# Patient Record
Sex: Female | Born: 1950 | Race: Black or African American | Hispanic: No | Marital: Married | Smoking: Never smoker
Health system: Southern US, Community
[De-identification: ages and names within clinical notes are randomized; demographics above are authoritative.]

## PROBLEM LIST (undated history)

## (undated) DIAGNOSIS — M1990 Unspecified osteoarthritis, unspecified site: Secondary | ICD-10-CM

## (undated) DIAGNOSIS — M719 Bursopathy, unspecified: Secondary | ICD-10-CM

## (undated) DIAGNOSIS — R112 Nausea with vomiting, unspecified: Secondary | ICD-10-CM

## (undated) DIAGNOSIS — I10 Essential (primary) hypertension: Secondary | ICD-10-CM

## (undated) DIAGNOSIS — D649 Anemia, unspecified: Secondary | ICD-10-CM

## (undated) DIAGNOSIS — K5792 Diverticulitis of intestine, part unspecified, without perforation or abscess without bleeding: Secondary | ICD-10-CM

## (undated) DIAGNOSIS — Z9889 Other specified postprocedural states: Secondary | ICD-10-CM

## (undated) DIAGNOSIS — E785 Hyperlipidemia, unspecified: Secondary | ICD-10-CM

## (undated) DIAGNOSIS — D509 Iron deficiency anemia, unspecified: Secondary | ICD-10-CM

## (undated) DIAGNOSIS — E669 Obesity, unspecified: Secondary | ICD-10-CM

## (undated) HISTORY — PX: CARPAL TUNNEL RELEASE: SHX101

## (undated) HISTORY — DX: Diverticulitis of intestine, part unspecified, without perforation or abscess without bleeding: K57.92

## (undated) HISTORY — DX: Hyperlipidemia, unspecified: E78.5

## (undated) HISTORY — PX: OTHER SURGICAL HISTORY: SHX169

## (undated) HISTORY — DX: Essential (primary) hypertension: I10

## (undated) HISTORY — PX: TONSILLECTOMY: SUR1361

---

## 1997-04-01 HISTORY — PX: ESOPHAGOGASTRODUODENOSCOPY: SHX1529

## 2000-12-15 ENCOUNTER — Other Ambulatory Visit: Admission: RE | Admit: 2000-12-15 | Discharge: 2000-12-15 | Payer: Self-pay | Admitting: Obstetrics and Gynecology

## 2001-02-14 ENCOUNTER — Encounter: Payer: Self-pay | Admitting: Obstetrics and Gynecology

## 2001-02-14 ENCOUNTER — Ambulatory Visit (HOSPITAL_COMMUNITY): Admission: RE | Admit: 2001-02-14 | Discharge: 2001-02-14 | Payer: Self-pay | Admitting: Obstetrics and Gynecology

## 2007-05-24 ENCOUNTER — Emergency Department: Payer: Self-pay | Admitting: Emergency Medicine

## 2007-05-24 ENCOUNTER — Other Ambulatory Visit: Payer: Self-pay

## 2007-06-20 ENCOUNTER — Ambulatory Visit: Payer: Self-pay | Admitting: Family Medicine

## 2008-03-14 ENCOUNTER — Ambulatory Visit: Payer: Self-pay | Admitting: Gastroenterology

## 2008-03-14 HISTORY — PX: COLONOSCOPY: SHX174

## 2009-01-19 ENCOUNTER — Emergency Department: Payer: Self-pay | Admitting: Emergency Medicine

## 2009-11-14 ENCOUNTER — Inpatient Hospital Stay: Payer: Self-pay | Admitting: Internal Medicine

## 2009-12-14 ENCOUNTER — Ambulatory Visit: Payer: Self-pay | Admitting: Family Medicine

## 2016-01-13 ENCOUNTER — Ambulatory Visit: Payer: Self-pay | Admitting: Nurse Practitioner

## 2016-01-20 ENCOUNTER — Ambulatory Visit: Payer: Self-pay | Admitting: Nurse Practitioner

## 2016-01-20 ENCOUNTER — Ambulatory Visit: Payer: Self-pay | Admitting: Family Medicine

## 2016-01-29 ENCOUNTER — Ambulatory Visit (INDEPENDENT_AMBULATORY_CARE_PROVIDER_SITE_OTHER)
Admission: RE | Admit: 2016-01-29 | Discharge: 2016-01-29 | Disposition: A | Discharge status: Home / Self Care | Payer: TRICARE For Life (TFL) | Source: Ambulatory Visit | Attending: Family Medicine | Admitting: Family Medicine

## 2016-01-29 ENCOUNTER — Ambulatory Visit (INDEPENDENT_AMBULATORY_CARE_PROVIDER_SITE_OTHER): Payer: TRICARE For Life (TFL) | Admitting: Family Medicine

## 2016-01-29 ENCOUNTER — Encounter: Payer: Self-pay | Admitting: Family Medicine

## 2016-01-29 ENCOUNTER — Encounter (INDEPENDENT_AMBULATORY_CARE_PROVIDER_SITE_OTHER): Payer: Self-pay

## 2016-01-29 VITALS — BP 130/90 | HR 93 | Temp 97.9°F | Ht 62.5 in | Wt 212.6 lb

## 2016-01-29 DIAGNOSIS — I10 Essential (primary) hypertension: Secondary | ICD-10-CM

## 2016-01-29 DIAGNOSIS — M5441 Lumbago with sciatica, right side: Secondary | ICD-10-CM

## 2016-01-29 DIAGNOSIS — E785 Hyperlipidemia, unspecified: Secondary | ICD-10-CM | POA: Diagnosis not present

## 2016-01-29 DIAGNOSIS — M25551 Pain in right hip: Secondary | ICD-10-CM

## 2016-01-29 LAB — COMPREHENSIVE METABOLIC PANEL
ALT: 14 U/L (ref 0–35)
AST: 16 U/L (ref 0–37)
Albumin: 4.4 g/dL (ref 3.5–5.2)
Alkaline Phosphatase: 78 U/L (ref 39–117)
BUN: 13 mg/dL (ref 6–23)
CO2: 27 mEq/L (ref 19–32)
Calcium: 10 mg/dL (ref 8.4–10.5)
Chloride: 107 mEq/L (ref 96–112)
Creatinine, Ser: 0.71 mg/dL (ref 0.40–1.20)
GFR: 106.88 mL/min (ref >60.00)
Glucose, Bld: 83 mg/dL (ref 70–99)
Potassium: 4.5 mEq/L (ref 3.5–5.1)
Sodium: 140 mEq/L (ref 135–145)
Total Bilirubin: 0.5 mg/dL (ref 0.2–1.2)
Total Protein: 8 g/dL (ref 6.0–8.3)

## 2016-01-29 LAB — HEMOGLOBIN A1C: Hgb A1c MFr Bld: 6.2 % (ref 4.6–6.5)

## 2016-01-29 NOTE — Progress Notes (Signed)
Pre visit review using our clinic review tool, if applicable. No additional management support is needed unless otherwise documented below in the visit note. 

## 2016-01-29 NOTE — Patient Instructions (Addendum)
Nice to meet you. We're going to check some lab work and then start you on a blood pressure medicine. You should continue to work on diet and exercise to help with her cholesterol. We will obtain an x-ray of your back and right hip to evaluate for causes of her discomfort. You can use Tylenol 1000 mg every 8 hours as needed for pain. If you develop numbness, weakness, loss of bowel or bladder function, numbness between her legs, fevers, or any new or changing symptoms please seek medical attention.

## 2016-01-31 ENCOUNTER — Encounter: Payer: Self-pay | Admitting: Family Medicine

## 2016-01-31 DIAGNOSIS — I10 Essential (primary) hypertension: Secondary | ICD-10-CM | POA: Insufficient documentation

## 2016-01-31 DIAGNOSIS — E785 Hyperlipidemia, unspecified: Secondary | ICD-10-CM | POA: Insufficient documentation

## 2016-01-31 DIAGNOSIS — M549 Dorsalgia, unspecified: Secondary | ICD-10-CM | POA: Insufficient documentation

## 2016-01-31 MED ORDER — AMLODIPINE BESYLATE 5 MG PO TABS: 5.0000 mg | ORAL_TABLET | Freq: Every day | ORAL | Status: DC

## 2016-01-31 NOTE — Progress Notes (Signed)
Patient ID: Mary Wolfe, female   DOB: 1951-01-07, 65 y.o.   MRN: [id number]  Tommi Rumps, MD Phone: [phone]  Mary Wolfe is a 65 y.o. female who presents today for new patient visit.  HYPERTENSION Disease Monitoring: Blood pressure range-not checking consistently, does report it has been high in the past and that today is actually low for her. Was 158/93 at recent health screening. Chest pain, palpitations- no      Dyspnea- no Medications: Compliance- not currently taking any medications, states she is managing it with natural supplements of which she does not remember the name Lightheadedness,Syncope- no   Edema- no  HYPERLIPIDEMIA Disease Monitoring: See symptoms for Hypertension Medications: Compliance- not currently on any medication.  Back pain and right hip pain: Patient notes intermittently for a number of months she has had right low back pain and right posterior and anterior hip pain. Does note some radiation of the discomfort down her right leg to above her knee posteriorly. Sometimes it is sharp and sometimes it is sore. Has had one single occasion of left lateral thigh numbness lasting a few minutes that resolved with change in position. No other numbness. No weakness. No loss of bowel or bladder function. No saddle anesthesia. No fevers. No history of cancer. She has been taking Aleve, Advil, turmeric, and CoQ10 for this with some benefit.    Active Ambulatory Problems    Diagnosis Date Noted  . Essential hypertension 01/31/2016  . Hyperlipidemia 01/31/2016  . Back pain 01/31/2016   Resolved Ambulatory Problems    Diagnosis Date Noted  . No Resolved Ambulatory Problems   Past Medical History  Diagnosis Date  . Diverticulitis   . Hypertension     Family History  Problem Relation Age of Onset  . Heart disease    . Arthritis      Social History   Social History  . Marital Status: Married    Spouse Name: N/A  . Number of Children: N/A  . Years of  Education: N/A   Occupational History  . Not on file.   Social History Main Topics  . Smoking status: Never Smoker   . Smokeless tobacco: Not on file  . Alcohol Use: No  . Drug Use: No  . Sexual Activity: Not on file   Other Topics Concern  . Not on file   Social History Narrative    ROS  General:  Negative for nexplained weight loss, fever Skin: Negative for new or changing mole, sore that won't heal HEENT: Negative for trouble hearing, trouble seeing, ringing in ears, mouth sores, hoarseness, change in voice, dysphagia. CV:  Negative for chest pain, dyspnea, edema, palpitations Resp: Negative for cough, dyspnea, hemoptysis GI: Negative for nausea, vomiting, diarrhea, constipation, abdominal pain, melena, hematochezia. GU: Negative for dysuria, incontinence, urinary hesitance, hematuria, vaginal or penile discharge, polyuria, sexual difficulty, lumps in testicle or breasts MSK: Positive for muscle cramps or aches, joint pain or swelling Neuro: Negative for headaches, weakness, numbness, dizziness, passing out/fainting Psych: Negative for depression, anxiety, memory problems  Objective  Physical Exam Filed Vitals:   01/29/16 0957  BP: 130/90  Pulse: 93  Temp: 97.9 F (36.6 C)    BP Readings from Last 3 Encounters:  01/29/16 130/90   Wt Readings from Last 3 Encounters:  01/29/16 212 lb 9.6 oz (96.435 kg)    Physical Exam  Constitutional: She is well-developed, well-nourished, and in no distress.  HENT:  Head: Normocephalic and atraumatic.  Right Ear: External  ear normal.  Left Ear: External ear normal.  Mouth/Throat: Oropharynx is clear and moist. No oropharyngeal exudate.  Eyes: Conjunctivae are normal. Pupils are equal, round, and reactive to light.  Neck: Neck supple.  Cardiovascular: Normal rate, regular rhythm and normal heart sounds.   Pulmonary/Chest: Effort normal and breath sounds normal.  Abdominal: Soft. Bowel sounds are normal. She exhibits no  distension. There is no tenderness.  Musculoskeletal: She exhibits no edema.  No midline spine tenderness no midline spine step-off, mild right low back muscular tenderness with no overlying skin changes, no other muscular back tenderness, no buttocks tenderness on the right, discomfort on internal rotation of right hip with mild decreased range of motion, no discomfort on external range of motion, no discomfort on left hip internal or external range of motion, negative sitting straight leg raise  Lymphadenopathy:    She has no cervical adenopathy.  Neurological: She is alert.  CN 2-12 intact, 5/5 strength in bilateral biceps, triceps, grip, quads, hamstrings, plantar and dorsiflexion, sensation to light touch intact in bilateral UE and LE, normal gait, 2+ patellar reflexes  Skin: Skin is warm and dry. She is not diaphoretic.  Psychiatric: Mood and affect normal.     Assessment/Plan:   Essential hypertension Slightly above goal diastolically on recheck today. I'll screening form reveals elevated blood pressure. CMP obtained revealing normal kidney function. We will start her on a low-dose of amlodipine. Discussed monitoring her blood pressure at a pharmacy since she has no cough at home.  Hyperlipidemia Total cholesterol 229 and LDL 148 on recent health screening. ASCVD risk score 7.6%. Initially calculated to be lower than this while patient was in the office. I discussed starting a statin though patient wanted to try diet and exercise first. Given this recalculated score we will contact the patient to discuss cholesterol medication further.  Back pain Patient with right low back pain and right hip pain. Neurologically intact. Had a single episode of numbness in the left lateral thigh that resolved with change of position and suspect this was positional. No red flags. Suspect possible muscular strain and low back versus possible osteoarthritis of the right hip given physical exam. Given single  episode of numbness will obtain an x-ray of her low back. Given her hip pain we will obtain x-ray of her hip. She can use Tylenol 1000 mg every 8 hours as needed for pain. Depending on x-ray results we will determine the next step in her management. Given return precautions.    Orders Placed This Encounter  Procedures  . DG HIP UNILAT WITH PELVIS 2-3 VIEWS RIGHT    Standing Status: Future     Number of Occurrences: 1     Standing Expiration Date: 03/30/2017    Order Specific Question:  Reason for Exam (SYMPTOM  OR DIAGNOSIS REQUIRED)    Answer:  right hip pain, discomfort on internal rotation    Order Specific Question:  Preferred imaging location?    Answer:  [facility]    Standing Status: Future     Number of Occurrences: 1     Standing Expiration Date: 03/30/2017    Order Specific Question:  Reason for Exam (SYMPTOM  OR DIAGNOSIS REQUIRED)    Answer:  low back pain with sciatica on right side, single episode of numbness    Order Specific Question:  Preferred imaging location?    Answer:  [place] ([facility])  . HgB A1c  Meds ordered this encounter  Medications  . amLODipine (NORVASC) 5 MG tablet    Sig: Take 1 tablet (5 mg total) by mouth daily.    Dispense:  90 tablet    Refill:  [place], MD [place]

## 2016-01-31 NOTE — Assessment & Plan Note (Addendum)
Patient with right low back pain and right hip pain. Neurologically intact. Had a single episode of numbness in the left lateral thigh that resolved with change of position and suspect this was positional. No red flags. Suspect possible muscular strain and low back versus possible osteoarthritis of the right hip given physical exam. Given single episode of numbness will obtain an x-ray of her low back. Given her hip pain we will obtain x-ray of her hip. She can use Tylenol 1000 mg every 8 hours as needed for pain. Depending on x-ray results we will determine the next step in her management. Given return precautions.

## 2016-01-31 NOTE — Assessment & Plan Note (Signed)
Total cholesterol 229 and LDL 148 on recent health screening. ASCVD risk score 7.6%. Initially calculated to be lower than this while patient was in the office. I discussed starting a statin though patient wanted to try diet and exercise first. Given this recalculated score we will contact the patient to discuss cholesterol medication further.

## 2016-01-31 NOTE — Assessment & Plan Note (Signed)
Slightly above goal diastolically on recheck today. I'll screening form reveals elevated blood pressure. CMP obtained revealing normal kidney function. We will start her on a low-dose of amlodipine. Discussed monitoring her blood pressure at a pharmacy since she has no cough at home.

## 2016-02-01 ENCOUNTER — Encounter: Payer: Self-pay | Admitting: Family Medicine

## 2016-02-02 ENCOUNTER — Telehealth: Payer: Self-pay | Admitting: Family Medicine

## 2016-02-02 DIAGNOSIS — M25551 Pain in right hip: Secondary | ICD-10-CM

## 2016-02-02 NOTE — Telephone Encounter (Signed)
Called patient and advised of lab results and x-ray results. Patient would prefer physical therapy rather than going to orthopedic surgery for her hip. This referral to physical therapy has been placed. Discussed the need for diet and exercise. Advised that on recalculating her ASCVD risk score she actually met criteria for statin benefit though she would like to work on diet and exercise prior to considering statin therapy.

## 2016-03-11 ENCOUNTER — Ambulatory Visit: Payer: TRICARE For Life (TFL) | Admitting: Family Medicine

## 2016-04-01 ENCOUNTER — Ambulatory Visit (INDEPENDENT_AMBULATORY_CARE_PROVIDER_SITE_OTHER): Payer: TRICARE For Life (TFL) | Admitting: Family Medicine

## 2016-04-01 ENCOUNTER — Encounter: Payer: Self-pay | Admitting: Family Medicine

## 2016-04-01 DIAGNOSIS — I10 Essential (primary) hypertension: Secondary | ICD-10-CM

## 2016-04-01 DIAGNOSIS — E669 Obesity, unspecified: Secondary | ICD-10-CM | POA: Diagnosis not present

## 2016-04-01 DIAGNOSIS — M1611 Unilateral primary osteoarthritis, right hip: Secondary | ICD-10-CM | POA: Insufficient documentation

## 2016-04-01 NOTE — Patient Instructions (Signed)
Nice to see you. Congratulations on her blood pressure and weight loss. Please continue your amlodipine and diet and exercise. Please continue physical therapy for your hip. You can try ibuprofen 600 mg by mouth every 8 hours as needed for discomfort or you can try Aleve 220 mg by mouth every 12 hours as needed for discomfort. Please periodically monitor your blood pressure and if consistently greater than 140/90 please let us know.

## 2016-04-01 NOTE — Progress Notes (Signed)
  Marikay Alar, MD Phone: [phone]  Mary Wolfe is a 65 y.o. female who presents today for follow-up.  Right hip pain: Patient notes this is 20-30% better since starting on PT. She's doing Tylenol as needed for discomfort. She has more movement with this. Mild soreness with this. Is improving. Tylenol has been somewhat beneficial.  Obesity: Patient has been working on diet and exercise. She is down 7 pounds. Has decreased fried food intake. Watching what she eats more. Physical therapy for exercise.  HYPERTENSION  Disease Monitoring  Home BP Monitoring not checking Chest pain- no    Dyspnea- no Medications  Compliance-  taking amlodipine.  Edema- no  PMH: nonsmoker.   ROS see history of present illness  Objective  Physical Exam Vitals:   04/01/16 0929  BP: 126/84  Pulse: 96  Temp: 98.5 F (36.9 C)    BP Readings from Last 3 Encounters:  04/01/16 126/84  01/29/16 130/90   Wt Readings from Last 3 Encounters:  04/01/16 205 lb 6.4 oz (93.2 kg)  01/29/16 212 lb 9.6 oz (96.4 kg)    Physical Exam  Constitutional: No distress.  HENT:  Head: Normocephalic and atraumatic.  Cardiovascular: Normal rate, regular rhythm and normal heart sounds.   Pulmonary/Chest: Effort normal and breath sounds normal.  Musculoskeletal:  No midline spine tenderness, no midline spine step-off, no muscular back tenderness, mild right lateral hip soreness on palpation, decreased internal and sternal range of motion in the right hip though no discomfort today, left hip with good internal or external range of motion with no discomfort  Neurological: She is alert. Gait normal.  Skin: She is not diaphoretic.     Assessment/Plan: Please see individual problem list.  Osteoarthritis of right hip Patient with severe degenerative changes on prior x-ray of right hip. She has responded quite well with physical therapy. Discussed trialing ibuprofen or naproxen as outlined in the AVS given that her  blood pressures under better control now. Advised not to take both of them. She will continue physical therapy. She'll continue to monitor.  Essential hypertension At goal today. Continue amlodipine.  Obesity Congratulated patient on loss of 7 pounds. Encouraged continued diet changes. Continue exercise. Continue to monitor.   No orders of the defined types were placed in this encounter.   No orders of the defined types were placed in this encounter.   # Healthcare maintenance: Patient will return at her convenience in the next 1-2 months for physical exam to take care of her health maintenance issues.   Marikay Alar, MD [facility]

## 2016-04-01 NOTE — Assessment & Plan Note (Signed)
Congratulated patient on loss of 7 pounds. Encouraged continued diet changes. Continue exercise. Continue to monitor.

## 2016-04-01 NOTE — Assessment & Plan Note (Signed)
At goal today. Continue amlodipine. 

## 2016-04-01 NOTE — Progress Notes (Signed)
Pre visit review using our clinic review tool, if applicable. No additional management support is needed unless otherwise documented below in the visit note. 

## 2016-04-01 NOTE — Assessment & Plan Note (Addendum)
Patient with severe degenerative changes on prior x-ray of right hip. She has responded quite well with physical therapy. Discussed trialing ibuprofen or naproxen as outlined in the AVS given that her blood pressures under better control now. Advised not to take both of them. She will continue physical therapy. She'll continue to monitor.

## 2016-05-25 ENCOUNTER — Encounter: Payer: TRICARE For Life (TFL) | Admitting: Family Medicine

## 2016-06-17 ENCOUNTER — Encounter: Payer: TRICARE For Life (TFL) | Admitting: Family Medicine

## 2016-06-24 ENCOUNTER — Ambulatory Visit (INDEPENDENT_AMBULATORY_CARE_PROVIDER_SITE_OTHER): Payer: TRICARE For Life (TFL) | Admitting: Family Medicine

## 2016-06-24 ENCOUNTER — Encounter: Payer: Self-pay | Admitting: Family Medicine

## 2016-06-24 VITALS — BP 138/82 | HR 74 | Temp 98.0°F | Ht 61.75 in | Wt 204.1 lb

## 2016-06-24 DIAGNOSIS — Z0000 Encounter for general adult medical examination without abnormal findings: Secondary | ICD-10-CM | POA: Diagnosis not present

## 2016-06-24 DIAGNOSIS — Z23 Encounter for immunization: Secondary | ICD-10-CM | POA: Diagnosis not present

## 2016-06-24 DIAGNOSIS — Z1329 Encounter for screening for other suspected endocrine disorder: Secondary | ICD-10-CM

## 2016-06-24 DIAGNOSIS — Z13220 Encounter for screening for lipoid disorders: Secondary | ICD-10-CM

## 2016-06-24 DIAGNOSIS — Z124 Encounter for screening for malignant neoplasm of cervix: Secondary | ICD-10-CM | POA: Diagnosis not present

## 2016-06-24 DIAGNOSIS — Z1231 Encounter for screening mammogram for malignant neoplasm of breast: Secondary | ICD-10-CM

## 2016-06-24 DIAGNOSIS — Z130 Encounter for screening for diseases of the blood and blood-forming organs and certain disorders involving the immune mechanism: Secondary | ICD-10-CM | POA: Diagnosis not present

## 2016-06-24 DIAGNOSIS — Z1211 Encounter for screening for malignant neoplasm of colon: Secondary | ICD-10-CM

## 2016-06-24 DIAGNOSIS — Z114 Encounter for screening for human immunodeficiency virus [HIV]: Secondary | ICD-10-CM

## 2016-06-24 DIAGNOSIS — Z1159 Encounter for screening for other viral diseases: Secondary | ICD-10-CM

## 2016-06-24 DIAGNOSIS — Z1239 Encounter for other screening for malignant neoplasm of breast: Secondary | ICD-10-CM

## 2016-06-24 LAB — TSH: TSH: 1.45 mIU/L

## 2016-06-24 LAB — CBC
HEMATOCRIT: 33.2 % — AB (ref 35.0–45.0)
HEMOGLOBIN: 11 g/dL — AB (ref 11.7–15.5)
MCH: 28 pg (ref 27.0–33.0)
MCHC: 33.1 g/dL (ref 32.0–36.0)
MCV: 84.5 fL (ref 80.0–100.0)
MPV: 10.5 fL (ref 7.5–12.5)
Platelets: 297 10*3/uL (ref 140–400)
RBC: 3.93 MIL/uL (ref 3.80–5.10)
RDW: 15.4 % — AB (ref 11.0–15.0)
WBC: 9.7 10*3/uL (ref 3.8–10.8)

## 2016-06-24 LAB — COMPREHENSIVE METABOLIC PANEL
ALBUMIN: 4.4 g/dL (ref 3.6–5.1)
ALT: 11 U/L (ref 6–29)
AST: 16 U/L (ref 10–35)
Alkaline Phosphatase: 74 U/L (ref 33–130)
BILIRUBIN TOTAL: 0.6 mg/dL (ref 0.2–1.2)
BUN: 15 mg/dL (ref 7–25)
CALCIUM: 9.8 mg/dL (ref 8.6–10.4)
CHLORIDE: 104 mmol/L (ref 98–110)
CO2: 26 mmol/L (ref 20–31)
Creat: 0.89 mg/dL (ref 0.50–0.99)
Glucose, Bld: 79 mg/dL (ref 65–99)
Potassium: 4.4 mmol/L (ref 3.5–5.3)
Sodium: 141 mmol/L (ref 135–146)
TOTAL PROTEIN: 7.8 g/dL (ref 6.1–8.1)

## 2016-06-24 LAB — LIPID PANEL
CHOL/HDL RATIO: 4.5 ratio (ref <=5.0)
CHOLESTEROL: 239 mg/dL — AB (ref 125–200)
HDL: 53 mg/dL (ref >=46)
LDL Cholesterol: 166 mg/dL — ABNORMAL HIGH (ref <130)
TRIGLYCERIDES: 102 mg/dL (ref <150)
VLDL: 20 mg/dL (ref <30)

## 2016-06-24 NOTE — Assessment & Plan Note (Addendum)
Overall patient is doing well. Advised on diet and exercise. Breast exam completed. Mammogram will be ordered. Referral to GI for colonoscopy. Referral to gynecology to complete pelvic exam and Pap smear. Labs as outlined below. Tetanus vaccination and Zostavax brought up-to-date.

## 2016-06-24 NOTE — Patient Instructions (Signed)
Nice to see you. We'll get you set up for a mammogram. We'll get you set up for a colonoscopy. We'll get you to see gynecology for Pap smear. We'll obtain some lab work and call you with the results.

## 2016-06-24 NOTE — Progress Notes (Signed)
Tommi Rumps, MD Phone: [phone]  Mary Wolfe is a 65 y.o. female who presents today for physical exam.  Notes she tries to stay away from sugar. Tries to eat a healthy diet. Avoids soda and sweet tea. Has not been exercising much recently. Though is going to get back to the gym next week. Has been doing exercises through physical therapy. Needs a mammogram, colonoscopy, and Pap smear. No prior HIV or hepatitis C testing. No tobacco use, alcohol use, or illicit drug use. Denies flu shot. Has not had a shingles vaccine. No prior tetanus vaccination. Sees an ophthalmologist and dentist.  Active Ambulatory Problems    Diagnosis Date Noted  . Essential hypertension 01/31/2016  . Hyperlipidemia 01/31/2016  . Back pain 01/31/2016  . Osteoarthritis of right hip 04/01/2016  . Obesity 04/01/2016  . Routine general medical examination at a health care facility 06/24/2016   Resolved Ambulatory Problems    Diagnosis Date Noted  . No Resolved Ambulatory Problems   Past Medical History:  Diagnosis Date  . Diverticulitis   . Hyperlipidemia   . Hypertension     Family History  Problem Relation Age of Onset  . Heart disease    . Arthritis      Social History   Social History  . Marital status: Married    Spouse name: N/A  . Number of children: N/A  . Years of education: N/A   Occupational History  . Not on file.   Social History Main Topics  . Smoking status: Never Smoker  . Smokeless tobacco: Not on file  . Alcohol use No  . Drug use: No  . Sexual activity: Not on file   Other Topics Concern  . Not on file   Social History Narrative  . No narrative on file    ROS  General:  Negative for nexplained weight loss, fever Skin: Negative for new or changing mole, sore that won't heal HEENT: Negative for trouble hearing, trouble seeing, ringing in ears, mouth sores, hoarseness, change in voice, dysphagia. CV:  Negative for chest pain, dyspnea, edema,  palpitations Resp: Negative for cough, dyspnea, hemoptysis GI: Negative for nausea, vomiting, diarrhea, constipation, abdominal pain, melena, hematochezia. GU: Negative for dysuria, incontinence, urinary hesitance, hematuria, vaginal or penile discharge, polyuria, sexual difficulty, lumps in testicle or breasts MSK: Negative for muscle cramps or aches, Positive for chronic joint pain  Neuro: Negative for headaches, weakness, numbness, dizziness, passing out/fainting Psych: Negative for depression, anxiety, memory problems  Objective  Physical Exam Vitals:   06/24/16 1532  BP: 138/82  Pulse: 74  Temp: 98 F (36.7 C)    BP Readings from Last 3 Encounters:  06/24/16 138/82  04/01/16 126/84  01/29/16 130/90   Wt Readings from Last 3 Encounters:  06/24/16 204 lb 2 oz (92.6 kg)  04/01/16 205 lb 6.4 oz (93.2 kg)  01/29/16 212 lb 9.6 oz (96.4 kg)    Physical Exam  Constitutional: No distress.  HENT:  Head: Normocephalic and atraumatic.  Mouth/Throat: Oropharynx is clear and moist. No oropharyngeal exudate.  Eyes: Conjunctivae are normal. Pupils are equal, round, and reactive to light.  Cardiovascular: Normal rate, regular rhythm and normal heart sounds.   Pulmonary/Chest: Effort normal and breath sounds normal.  Bilateral breasts no masses or skin changes, no nipple inversion, no axillary masses palpated  Abdominal: Soft. Bowel sounds are normal. She exhibits no distension. There is no tenderness. There is no rebound and no guarding.  Genitourinary:  Genitourinary Comments: Attempted speculum  exam though unable to get the cervix into view given body habitus and cervical positioning, normal vagina  Musculoskeletal: She exhibits no edema.  Neurological: She is alert. Gait normal.  Skin: Skin is warm and dry. She is not diaphoretic.  Psychiatric: Mood and affect normal.     Assessment/Plan:   Routine general medical examination at a health care facility Overall patient is  doing well. Advised on diet and exercise. Breast exam completed. Mammogram will be ordered. Referral to GI for colonoscopy. Referral to gynecology to complete pelvic exam and Pap smear. Labs as outlined below. Tetanus vaccination and Zostavax brought up-to-date.   Orders Placed This Encounter  Procedures  . MM SCREENING BREAST TOMO BILATERAL    Standing Status:   Future    Standing Expiration Date:   08/24/2017    Order Specific Question:   Reason for Exam (SYMPTOM  OR DIAGNOSIS REQUIRED)    Answer:   breast cancer screening    Order Specific Question:   Preferred imaging location?    Answer:   [facility]  . Tdap vaccine greater than or equal to 7yo IM  . Varicella-zoster vaccine subcutaneous  . Lipid Profile  . CBC  . TSH  . Comp Met ([facility])  . Hepatitis C Antibody  . HIV antibody (with reflex)  . Ambulatory referral to Gynecology    Referral Priority:   Routine    Referral Type:   Consultation    Referral Reason:   Specialty Services Required    Requested Specialty:   Gynecology    Number of Visits Requested:   1  . Ambulatory referral to Gastroenterology    Referral Priority:   Routine    Referral Type:   Consultation    Referral Reason:   Specialty Services Required    Number of Visits Requested:   1    Meds ordered this encounter  Medications  . naproxen sodium (ANAPROX) 220 MG tablet    Sig: Take 220 mg by mouth 2 (two) times daily with a meal.     Tommi Rumps, MD [place]

## 2016-06-25 LAB — HEPATITIS C ANTIBODY: HCV AB: NEGATIVE

## 2016-06-25 LAB — HIV ANTIBODY (ROUTINE TESTING W REFLEX): HIV: NONREACTIVE

## 2016-06-29 ENCOUNTER — Encounter: Payer: Self-pay | Admitting: Family Medicine

## 2016-06-30 ENCOUNTER — Telehealth: Payer: Self-pay | Admitting: Family Medicine

## 2016-06-30 NOTE — Telephone Encounter (Signed)
Noted. Agree. CMA spoke with patient and patient stated there was swelling and itching just at the injection site. No redness, pain, or warmth.

## 2016-06-30 NOTE — Telephone Encounter (Signed)
Pt called and left a voicemail stating where she had her Tdap done it is swollen and itching, would like to know if there is anything that she can do? Please advise, thank you!  Call pt @ [phone]

## 2016-06-30 NOTE — Telephone Encounter (Signed)
After talking to patient and Dr. Birdie SonsSonnenberg suggested to put ice on injection site. If start having redness, pain, or heat she will need to be seen. Patient verbalized understanding.

## 2016-08-03 ENCOUNTER — Ambulatory Visit
Admission: RE | Admit: 2016-08-03 | Discharge: 2016-08-03 | Disposition: A | Discharge status: Home / Self Care | Source: Ambulatory Visit | Attending: Family Medicine | Admitting: Family Medicine

## 2016-08-03 DIAGNOSIS — Z1239 Encounter for other screening for malignant neoplasm of breast: Secondary | ICD-10-CM

## 2016-08-03 DIAGNOSIS — Z1231 Encounter for screening mammogram for malignant neoplasm of breast: Secondary | ICD-10-CM | POA: Insufficient documentation

## 2016-08-24 ENCOUNTER — Encounter: Payer: Self-pay | Admitting: Obstetrics and Gynecology

## 2016-08-25 ENCOUNTER — Encounter: Payer: Self-pay | Admitting: Obstetrics and Gynecology

## 2016-09-28 ENCOUNTER — Encounter: Payer: Self-pay | Admitting: Obstetrics and Gynecology

## 2016-10-03 ENCOUNTER — Encounter: Payer: Self-pay | Admitting: Obstetrics and Gynecology

## 2016-10-03 ENCOUNTER — Ambulatory Visit (INDEPENDENT_AMBULATORY_CARE_PROVIDER_SITE_OTHER): Payer: TRICARE For Life (TFL) | Admitting: Obstetrics and Gynecology

## 2016-10-03 VITALS — BP 138/86 | HR 92 | Ht 61.75 in | Wt 221.1 lb

## 2016-10-03 DIAGNOSIS — Z01419 Encounter for gynecological examination (general) (routine) without abnormal findings: Secondary | ICD-10-CM

## 2016-10-03 NOTE — Progress Notes (Signed)
HPI:      [name] is a 66 y.o. [phone] who LMP was No LMP recorded. Patient is postmenopausal..  Subjective: She presents today referred by Dr. Birdie SonsSonnenberg for a pelvic examination and Pap smear. He does her general medical care. She has no specific gynecologic complaints today.     Hx: The following portions of the patient's history were reviewed and updated as appropriate:           She  has a past medical history of Diverticulitis; Hyperlipidemia; and Hypertension. She  does not have any pertinent problems on file. She  has a past surgical history that includes Tonsillectomy and Cesarean section.        ROS: Constitutional: Denied constitutional symptoms, night sweats, recent illness, fatigue, fever, insomnia and weight loss.  Eyes: Denied eye symptoms, eye pain, photophobia, vision change and visual disturbance.  Ears/Nose/Throat/Neck: Denied ear, nose, throat or neck symptoms, hearing loss, nasal discharge, sinus congestion and sore throat.  Cardiovascular: Denied cardiovascular symptoms, arrhythmia, chest pain/pressure, edema, exercise intolerance, orthopnea and palpitations.  Respiratory: Denied pulmonary symptoms, asthma, pleuritic pain, productive sputum, cough, dyspnea and wheezing.  Gastrointestinal: Denied, gastro-esophageal reflux, melena, nausea and vomiting.  Genitourinary: Denied genitourinary symptoms including symptomatic vaginal discharge, pelvic relaxation issues, and urinary complaints.  Musculoskeletal: Denied musculoskeletal symptoms, stiffness, swelling, muscle weakness and myalgia.  Dermatologic: Denied dermatology symptoms, rash and scar.  Neurologic: Denied neurology symptoms, dizziness, headache, neck pain and syncope.  Psychiatric: Denied psychiatric symptoms, anxiety and depression.  Endocrine: Denied endocrine symptoms including hot flashes and night sweats.   Meds: She has a current medication list which includes the following prescription(s):  amlodipine and celery seed.  Objective: Vitals:   10/03/16 1055  BP: 138/86  Pulse: 92             Physical examination   Pelvic:   Vulva: Normal appearance.  No lesions.  Vagina: No lesions or abnormalities noted. Mild vaginal atrophy   Support: Normal pelvic support.  Urethra No masses tenderness or scarring.  Meatus Normal size without lesions or prolapse.  Cervix: Normal appearance.  No lesions.  Anus: Normal exam.  No lesions.  Perineum: Normal exam.  No lesions.        Bimanual   Uterus: Normal size.  Non-tender.  Mobile.  AV.  Adnexae: No masses.  Non-tender to palpation.  Cul-de-sac: Negative for abnormality.     Assessment: 1. Encounter for cervical Pap smear with pelvic exam    Plan:            1.  Pap and pelvic performed-patient declined breast exam. Continue general medical care with Dr. Birdie SonsSonnenberg.   Orders Meds ordered this encounter  Medications  . Celery Seed OIL    Sig: Use.      F/U  Return in about 1 year (around 10/03/2017) for Annual Physical, We will contact her with any abnormal test results.  Elonda Huskyavid J. Evans, M.D. 10/03/2016 11:26 AM

## 2016-10-03 NOTE — Progress Notes (Signed)
New 66 yo G3 P1 2 miscarriges  here on a referral from Dr Serena CroissantSonneburg for a pap smear. Mammogram done 08/03/16 WNL. No c/o.

## 2016-10-05 LAB — PAP IG AND HPV HIGH-RISK
HPV, high-risk: NEGATIVE
PAP SMEAR COMMENT: 0

## 2016-10-07 ENCOUNTER — Telehealth: Payer: Self-pay

## 2016-10-07 NOTE — Telephone Encounter (Signed)
Attempted to call patient- unable to leave message due to mailbox being full.

## 2016-10-07 NOTE — Telephone Encounter (Signed)
-----   Message from David James Evans, MD sent at 10/06/2016  5:00 PM EST ----- Pap is Negative 

## 2016-10-10 NOTE — Telephone Encounter (Signed)
-----   Message from David James Evans, MD sent at 10/06/2016  5:00 PM EST ----- Pap is Negative 

## 2016-10-10 NOTE — Telephone Encounter (Signed)
Unable to reach patient voice mail box is full

## 2016-10-11 NOTE — Telephone Encounter (Signed)
Informed patient of negative pap results.

## 2016-10-11 NOTE — Telephone Encounter (Signed)
-----   Message from Linzie Collinavid James Evans, MD sent at 10/06/2016  5:00 PM EST ----- Pap is Negative

## 2016-12-21 ENCOUNTER — Ambulatory Visit (INDEPENDENT_AMBULATORY_CARE_PROVIDER_SITE_OTHER): Payer: TRICARE For Life (TFL) | Admitting: Family Medicine

## 2016-12-21 ENCOUNTER — Telehealth: Payer: Self-pay | Admitting: Family Medicine

## 2016-12-21 ENCOUNTER — Encounter: Payer: Self-pay | Admitting: Family Medicine

## 2016-12-21 VITALS — BP 130/90 | HR 74 | Temp 98.0°F | Wt 207.2 lb

## 2016-12-21 DIAGNOSIS — E785 Hyperlipidemia, unspecified: Secondary | ICD-10-CM | POA: Diagnosis not present

## 2016-12-21 DIAGNOSIS — M2142 Flat foot [pes planus] (acquired), left foot: Secondary | ICD-10-CM

## 2016-12-21 DIAGNOSIS — M7989 Other specified soft tissue disorders: Secondary | ICD-10-CM | POA: Insufficient documentation

## 2016-12-21 DIAGNOSIS — M2141 Flat foot [pes planus] (acquired), right foot: Secondary | ICD-10-CM

## 2016-12-21 DIAGNOSIS — M25472 Effusion, left ankle: Secondary | ICD-10-CM

## 2016-12-21 DIAGNOSIS — I10 Essential (primary) hypertension: Secondary | ICD-10-CM | POA: Diagnosis not present

## 2016-12-21 LAB — COMPREHENSIVE METABOLIC PANEL
ALBUMIN: 4.3 g/dL (ref 3.5–5.2)
ALK PHOS: 75 U/L (ref 39–117)
ALT: 14 U/L (ref 0–35)
AST: 17 U/L (ref 0–37)
BUN: 9 mg/dL (ref 6–23)
CO2: 26 mEq/L (ref 19–32)
Calcium: 10 mg/dL (ref 8.4–10.5)
Chloride: 105 mEq/L (ref 96–112)
Creatinine, Ser: 0.76 mg/dL (ref 0.40–1.20)
GFR: 98.52 mL/min (ref >60.00)
Glucose, Bld: 86 mg/dL (ref 70–99)
POTASSIUM: 3.9 meq/L (ref 3.5–5.1)
Sodium: 139 mEq/L (ref 135–145)
TOTAL PROTEIN: 8 g/dL (ref 6.0–8.3)
Total Bilirubin: 0.7 mg/dL (ref 0.2–1.2)

## 2016-12-21 LAB — LDL CHOLESTEROL, DIRECT: Direct LDL: 142 mg/dL

## 2016-12-21 NOTE — Assessment & Plan Note (Signed)
Isolated medial left ankle swelling. There is mild tenderness in this area. Has pes planus which could be contributing to abnormal ankle positioning. Could be arthritic changes in the ankle. We'll refer to podiatry for further evaluation and consideration of imaging versus custom inserts for her shoes.

## 2016-12-21 NOTE — Patient Instructions (Signed)
Nice to see. We will get you to see podiatry for your flat feet and left ankle pain and swelling. You should ice the left ankle. Please continue your blood pressure medication. We'll obtain lab work and contact you with results.

## 2016-12-21 NOTE — Assessment & Plan Note (Signed)
Near goal. Encouraged her to take her amlodipine daily. She'll start checking her blood pressure and if it is running any higher than today she will let us know.

## 2016-12-21 NOTE — Assessment & Plan Note (Signed)
Has made some significant dietary changes. Encouraged exercise. We'll check an LDL today.

## 2016-12-21 NOTE — Progress Notes (Signed)
  Tommi Rumps, MD Phone: [phone]  Mary Wolfe is a 66 y.o. female who presents today for follow-up.  HYPERTENSION  Disease Monitoring  Home BP Monitoring not checking Chest pain- no    Dyspnea- no Medications  Compliance-  Taking amlodipine intermittently  Edema- no  Hyperlipidemia: No claudication. She's been working on diet with no sweet intake and she has cut out white foods. Not doing much exercise as she has had a number of deaths in the family over the last 6 months. Discussed her getting back to this.  Notes she has dealt with the deaths in her family with prayer and talking to those around her. She initially felt some depression though has moved through this and her grief is improving.  Left ankle swelling: Patient notes medial left ankle swelling for the last couple of weeks. It goes down at night. There is no pain. No injury. She's not propping up. No orthopnea. No calf swelling or pain. Does have flat feet.   PMH: nonsmoker.   ROS see history of present illness  Objective  Physical Exam Vitals:   12/21/16 0914  BP: 130/90  Pulse: 74  Temp: 98 F (36.7 C)    BP Readings from Last 3 Encounters:  12/21/16 130/90  10/03/16 138/86  06/24/16 138/82   Wt Readings from Last 3 Encounters:  12/21/16 207 lb 3.2 oz (94 kg)  10/03/16 221 lb 2 oz (100.3 kg)  06/24/16 204 lb 2 oz (92.6 kg)    Physical Exam  Constitutional: No distress.  Cardiovascular: Normal rate, regular rhythm and normal heart sounds.   Pulmonary/Chest: Effort normal and breath sounds normal.  Musculoskeletal:  Left ankle medial aspect just inferior to the malleolus with mild swelling with some tenderness here, no tenderness of the malleoli or navicular bone or fifth head of metatarsal, full range of motion of the ankle, 2+ DP pulse on the left and right, right foot with hammertoes in toes 2 and 3, hallux valgus bilaterally, pes planus bilaterally, no swelling in the right ankle, no calf  tenderness or swelling bilaterally  Neurological: She is alert. Gait normal.  Skin: Skin is warm and dry. She is not diaphoretic.     Assessment/Plan: Please see individual problem list.  Essential hypertension Near goal. Encouraged her to take her amlodipine daily. She'll start checking her blood pressure and if it is running any higher than today she will let us know.  Hyperlipidemia Has made some significant dietary changes. Encouraged exercise. We'll check an LDL today.  Left ankle swelling Isolated medial left ankle swelling. There is mild tenderness in this area. Has pes planus which could be contributing to abnormal ankle positioning. Could be arthritic changes in the ankle. We'll refer to podiatry for further evaluation and consideration of imaging versus custom inserts for her shoes.  Discussed monitoring her grief. If worsens she'll let us know.  Orders Placed This Encounter  Procedures  . Direct LDL  . Comp Met (CMET)  . Ambulatory referral to Podiatry    Referral Priority:   Routine    Referral Type:   Consultation    Referral Reason:   Specialty Services Required    Requested Specialty:   Podiatry    Number of Visits Requested:   1    Tommi Rumps, MD [place]

## 2016-12-21 NOTE — Telephone Encounter (Signed)
Pt called back returning your call. Thank you!  Call pt @ [phone].

## 2016-12-21 NOTE — Progress Notes (Signed)
Pre visit review using our clinic review tool, if applicable. No additional management support is needed unless otherwise documented below in the visit note. 

## 2016-12-21 NOTE — Telephone Encounter (Signed)
See result note.  

## 2017-01-13 ENCOUNTER — Ambulatory Visit: Payer: Self-pay | Admitting: Podiatry

## 2017-01-20 ENCOUNTER — Ambulatory Visit: Payer: Self-pay | Admitting: Podiatry

## 2017-02-21 IMAGING — MG MM DIGITAL SCREENING BILAT W/ TOMO W/ CAD
8 of 12 series · 8 of 28 positions shown · non-contrast
Comparison: Previous exam(s).

ACR Breast Density Category a: The breast tissue is almost entirely
fatty.

CLINICAL DATA: Screening.

EXAM:
2D DIGITAL SCREENING BILATERAL MAMMOGRAM WITH CAD AND ADJUNCT TOMO

[L MLO]
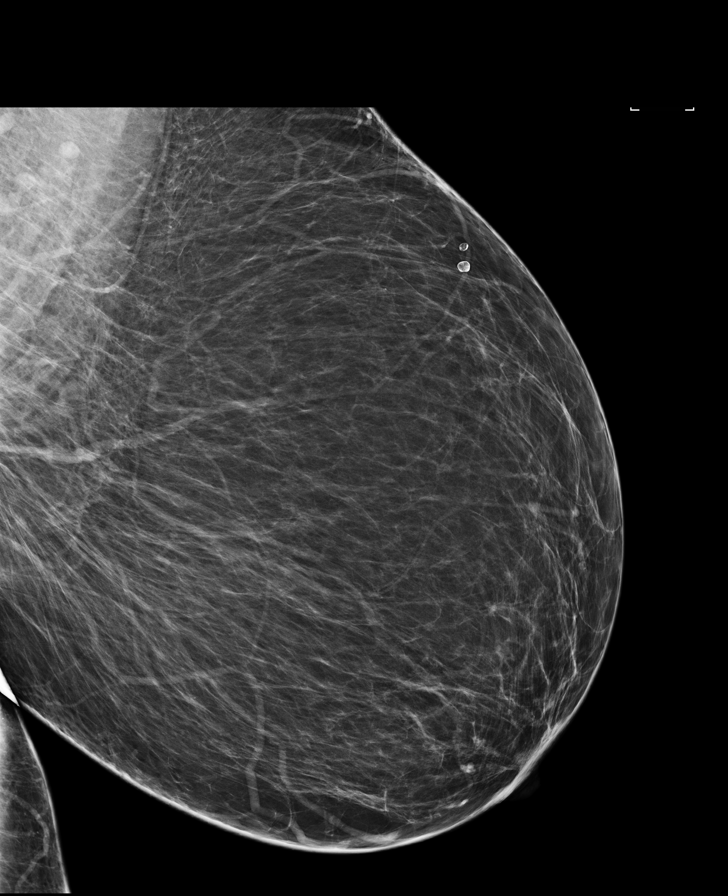

[R CC synth-2D]
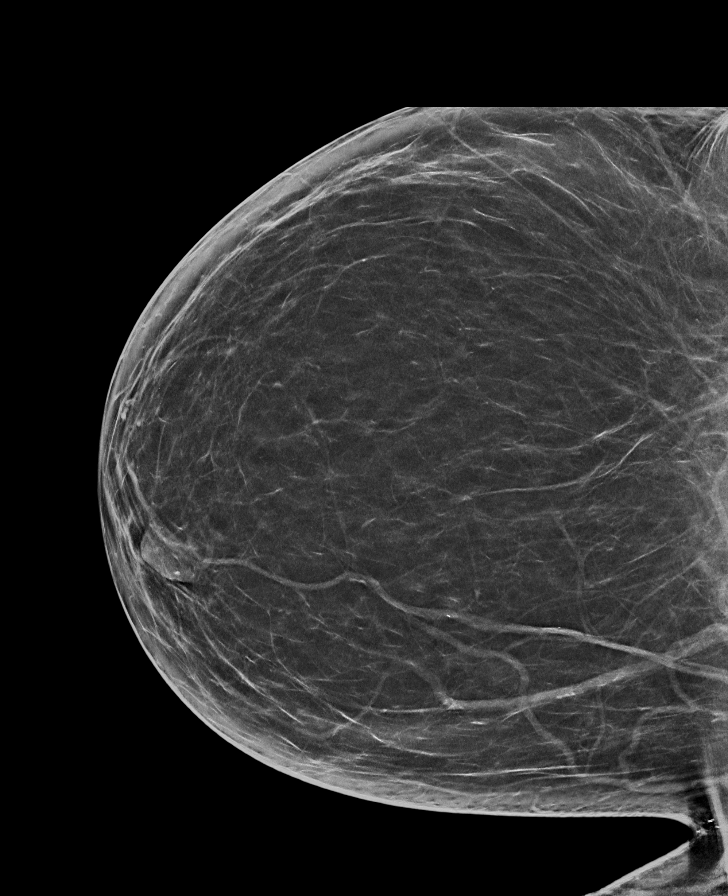

[L MLO synth-2D]
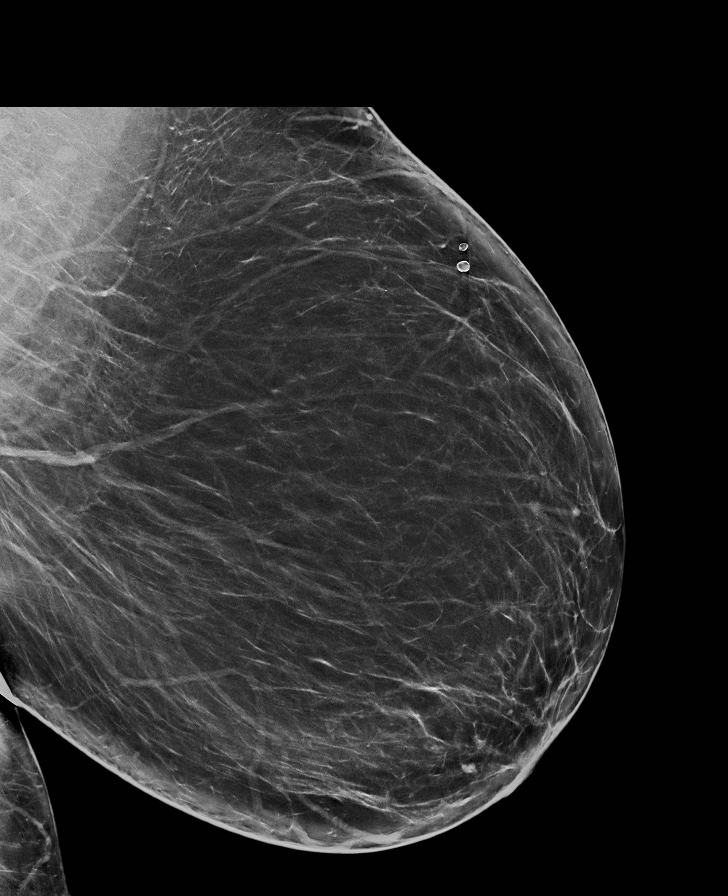

[R MLO synth-2D]
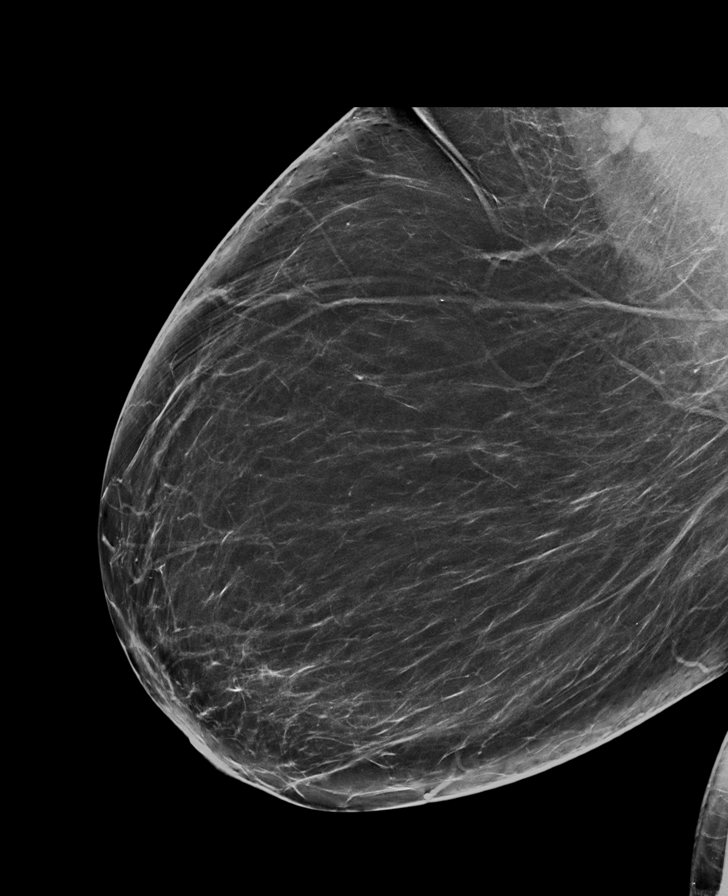

[L CC]
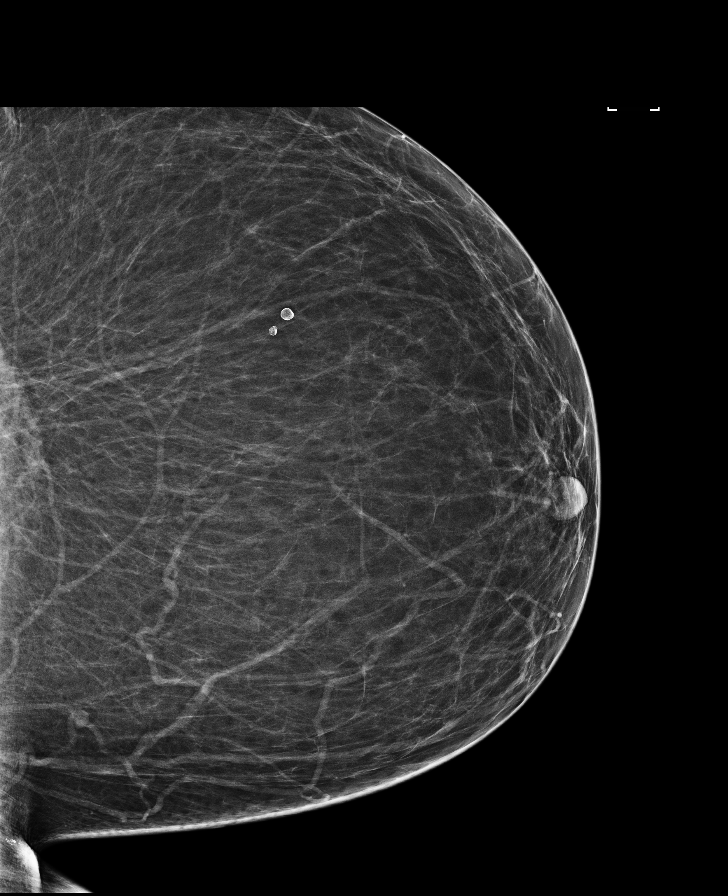

[R MLO]
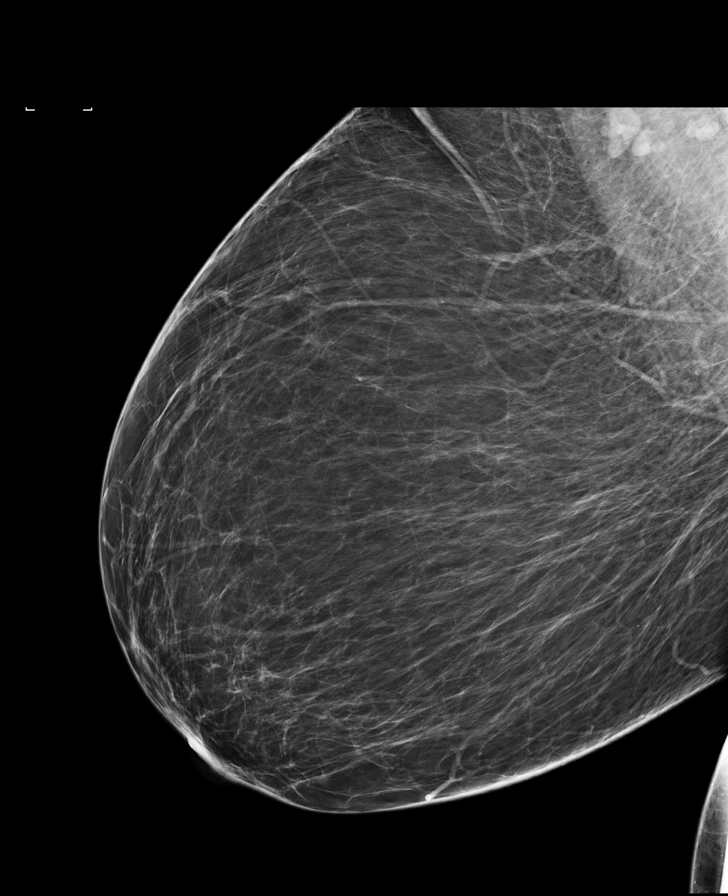

[R CC]
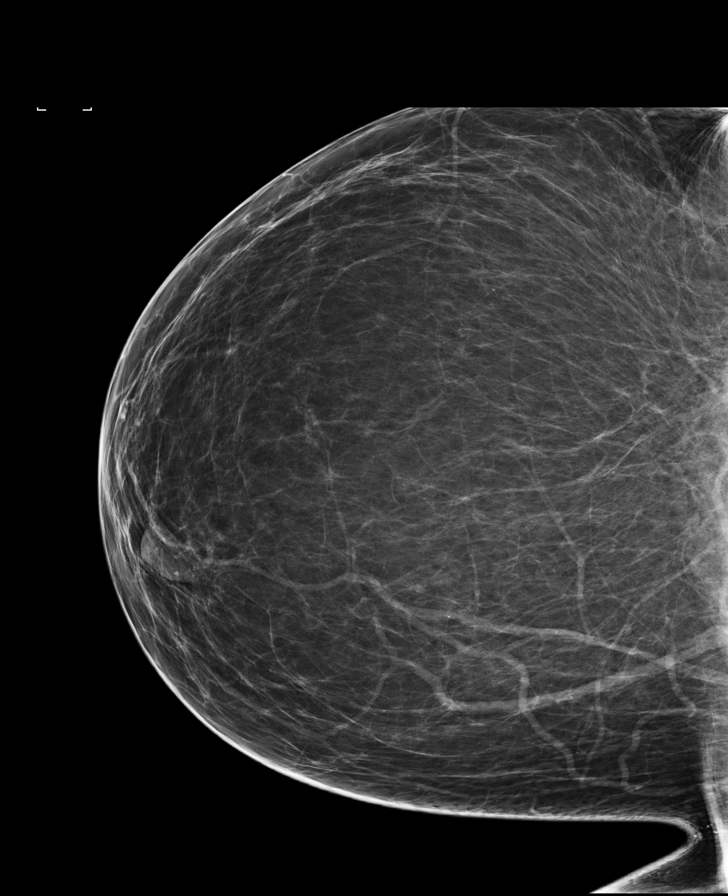

[L CC synth-2D]
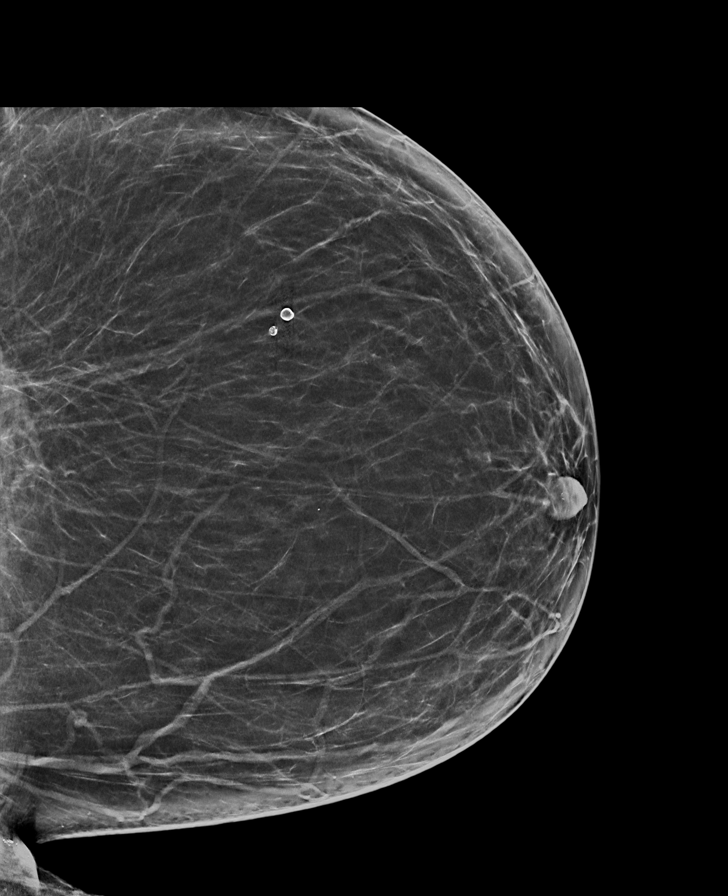

[8 of 28 positions shown; findings below may reference images not displayed]

FINDINGS: There are no findings suspicious for malignancy. Images were
processed with CAD.
IMPRESSION: No mammographic evidence of malignancy. A result letter of this
screening mammogram will be mailed directly to the patient.

RECOMMENDATION:
Screening mammogram in one year. (Code:1B-X-8P0)

BI-RADS CATEGORY  1: Negative.

## 2017-03-03 ENCOUNTER — Other Ambulatory Visit: Payer: Self-pay | Admitting: Family Medicine

## 2017-03-15 ENCOUNTER — Other Ambulatory Visit: Payer: Self-pay | Admitting: Family Medicine

## 2017-06-21 ENCOUNTER — Ambulatory Visit: Payer: TRICARE For Life (TFL) | Admitting: Family Medicine

## 2017-07-18 ENCOUNTER — Other Ambulatory Visit: Payer: Self-pay

## 2017-07-18 MED ORDER — AMLODIPINE BESYLATE 5 MG PO TABS: 5.0000 mg | ORAL_TABLET | Freq: Every day | ORAL | 3 refills | Status: DC

## 2017-09-04 ENCOUNTER — Ambulatory Visit: Payer: TRICARE For Life (TFL) | Admitting: Family Medicine

## 2017-10-18 ENCOUNTER — Ambulatory Visit (INDEPENDENT_AMBULATORY_CARE_PROVIDER_SITE_OTHER): Payer: TRICARE For Life (TFL) | Admitting: Family Medicine

## 2017-10-18 ENCOUNTER — Encounter: Payer: Self-pay | Admitting: Family Medicine

## 2017-10-18 ENCOUNTER — Other Ambulatory Visit: Payer: Self-pay

## 2017-10-18 VITALS — BP 146/88 | HR 88 | Temp 98.4°F | Wt 212.0 lb

## 2017-10-18 DIAGNOSIS — Z1239 Encounter for other screening for malignant neoplasm of breast: Secondary | ICD-10-CM

## 2017-10-18 DIAGNOSIS — Z1329 Encounter for screening for other suspected endocrine disorder: Secondary | ICD-10-CM | POA: Diagnosis not present

## 2017-10-18 DIAGNOSIS — Z6841 Body Mass Index (BMI) 40.0 and over, adult: Secondary | ICD-10-CM

## 2017-10-18 DIAGNOSIS — M1611 Unilateral primary osteoarthritis, right hip: Secondary | ICD-10-CM

## 2017-10-18 DIAGNOSIS — D649 Anemia, unspecified: Secondary | ICD-10-CM

## 2017-10-18 DIAGNOSIS — Z1231 Encounter for screening mammogram for malignant neoplasm of breast: Secondary | ICD-10-CM | POA: Diagnosis not present

## 2017-10-18 DIAGNOSIS — Z1211 Encounter for screening for malignant neoplasm of colon: Secondary | ICD-10-CM | POA: Diagnosis not present

## 2017-10-18 DIAGNOSIS — I10 Essential (primary) hypertension: Secondary | ICD-10-CM | POA: Diagnosis not present

## 2017-10-18 DIAGNOSIS — E785 Hyperlipidemia, unspecified: Secondary | ICD-10-CM

## 2017-10-18 LAB — LIPID PANEL
CHOL/HDL RATIO: 4
CHOLESTEROL: 226 mg/dL — AB (ref 0–200)
HDL: 50.6 mg/dL (ref >39.00)
LDL CALC: 161 mg/dL — AB (ref 0–99)
NonHDL: 175.2
Triglycerides: 70 mg/dL (ref 0.0–149.0)
VLDL: 14 mg/dL (ref 0.0–40.0)

## 2017-10-18 LAB — CBC
HCT: 35 % — ABNORMAL LOW (ref 36.0–46.0)
HEMOGLOBIN: 11.6 g/dL — AB (ref 12.0–15.0)
MCHC: 33.2 g/dL (ref 30.0–36.0)
MCV: 85.7 fl (ref 78.0–100.0)
Platelets: 290 10*3/uL (ref 150.0–400.0)
RBC: 4.09 Mil/uL (ref 3.87–5.11)
RDW: 14.2 % (ref 11.5–15.5)
WBC: 8.1 10*3/uL (ref 4.0–10.5)

## 2017-10-18 LAB — COMPREHENSIVE METABOLIC PANEL
ALT: 11 U/L (ref 0–35)
AST: 14 U/L (ref 0–37)
Albumin: 4.3 g/dL (ref 3.5–5.2)
Alkaline Phosphatase: 77 U/L (ref 39–117)
BUN: 14 mg/dL (ref 6–23)
CHLORIDE: 105 meq/L (ref 96–112)
CO2: 28 meq/L (ref 19–32)
CREATININE: 0.71 mg/dL (ref 0.40–1.20)
Calcium: 9.9 mg/dL (ref 8.4–10.5)
GFR: 106.3 mL/min (ref >60.00)
GLUCOSE: 92 mg/dL (ref 70–99)
POTASSIUM: 4.2 meq/L (ref 3.5–5.1)
SODIUM: 141 meq/L (ref 135–145)
Total Bilirubin: 0.5 mg/dL (ref 0.2–1.2)
Total Protein: 7.9 g/dL (ref 6.0–8.3)

## 2017-10-18 LAB — TSH: TSH: 1.18 u[IU]/mL (ref 0.35–4.50)

## 2017-10-18 LAB — HEMOGLOBIN A1C: HEMOGLOBIN A1C: 6 % (ref 4.6–6.5)

## 2017-10-18 LAB — IRON,TIBC AND FERRITIN PANEL
%SAT: 12 % (ref 11–50)
FERRITIN: 86 ng/mL (ref 20–288)
Iron: 35 ug/dL — ABNORMAL LOW (ref 45–160)
TIBC: 292 mcg/dL (calc) (ref 250–450)

## 2017-10-18 NOTE — Progress Notes (Signed)
  Tommi Rumps, MD Phone: [phone]  Mary Wolfe is a 67 y.o. female who presents today for follow-up.  HYPERTENSION  Disease Monitoring  Home BP Monitoring 130s/80s Chest pain- no    Dyspnea- no Medications  Compliance-  Taking amlodipine.  Edema- no  Hyperlipidemia: Not on medication.  She does leg exercises and rides a bike.  She tries to avoid fried fatty foods.  Eats lots of vegetables and lean meats.  No soda or sweet tea.  Right hip OA: Notes pretty consistent pain with this.  Feels better when she exercises.  She does not want surgery.  She is on tumeric for this with no benefit.  Took Aleve previously though this increased her blood pressure.  She has a chronic history of mild anemia.  Due for recheck.  No blood in her stool.   Social History   Tobacco Use  Smoking Status Never Smoker  Smokeless Tobacco Never Used     ROS see history of present illness  Objective  Physical Exam Vitals:   10/18/17 1010  BP: (!) 146/88  Pulse: 88  Temp: 98.4 F (36.9 C)  SpO2: 96%    BP Readings from Last 3 Encounters:  10/18/17 (!) 146/88  12/21/16 130/90  10/03/16 138/86   Wt Readings from Last 3 Encounters:  10/18/17 212 lb (96.2 kg)  12/21/16 207 lb 3.2 oz (94 kg)  10/03/16 221 lb 2 oz (100.3 kg)    Physical Exam  Constitutional: No distress.  Cardiovascular: Normal rate, regular rhythm and normal heart sounds.  Pulmonary/Chest: Effort normal and breath sounds normal.  Musculoskeletal: She exhibits no edema.  Discomfort and decreased range of motion on internal rotation of right hip, normal external range of motion right right hip, full range of motion left hip with no pain  Neurological: She is alert. Gait normal.  Skin: Skin is warm and dry. She is not diaphoretic.     Assessment/Plan: Please see individual problem list.  Essential hypertension Pretty close to goal for age.  Discussed increasing amlodipine though she declined.  She will continue to  monitor at home and let us know if it goes up.  Hyperlipidemia Continue to work on diet and exercise.  Lab work today.  Osteoarthritis of right hip Severe degenerative changes on right hip x-ray previously.  Continues to have discomfort with this.  Offered referral to an orthopedic surgeon though she declined.  Offered referral to sports medicine though she declined.  Discussed Tylenol over-the-counter for this.  She will avoid NSAIDs.  She can discontinue tumeric.  Obesity Work on diet and exercise.  Anemia Long history of mild anemia.  We will plan on rechecking today.  Check iron studies as well.   Orders Placed This Encounter  Procedures  . Comp Met (CMET)  . Lipid panel  . HgB A1c  . CBC  . Iron, TIBC and Ferritin Panel  . TSH  . Ambulatory referral to Gastroenterology    Referral Priority:   Routine    Referral Type:   Consultation    Referral Reason:   Specialty Services Required    Number of Visits Requested:   1    No orders of the defined types were placed in this encounter.    Tommi Rumps, MD [place]

## 2017-10-18 NOTE — Assessment & Plan Note (Addendum)
Pretty close to goal for age.  Discussed increasing amlodipine though she declined.  She will continue to monitor at home and let us know if it goes up.

## 2017-10-18 NOTE — Assessment & Plan Note (Signed)
Long history of mild anemia.  We will plan on rechecking today.  Check iron studies as well.

## 2017-10-18 NOTE — Assessment & Plan Note (Signed)
Continue to work on diet and exercise.  Lab work today.

## 2017-10-18 NOTE — Patient Instructions (Signed)
Nice to see you. We will get lab work today and contact you with the results. Please continue to work on diet.  Please do the exercises for your right hip. Please monitor your blood pressure.  If it starts to go up please let us know.

## 2017-10-18 NOTE — Assessment & Plan Note (Addendum)
Severe degenerative changes on right hip x-ray previously.  Continues to have discomfort with this.  Offered referral to an orthopedic surgeon though she declined.  Offered referral to sports medicine though she declined.  Discussed Tylenol over-the-counter for this.  She will avoid NSAIDs.  She can discontinue tumeric.

## 2017-10-18 NOTE — Assessment & Plan Note (Signed)
Work on diet and exercise 

## 2017-10-19 NOTE — Progress Notes (Signed)
The 10-year ASCVD risk score Denman George(Goff DC Montez HagemanJr., et al., 2013) is: 13.5%   Values used to calculate the score:     Age: [age] years     Sex: Female     Is Non-Hispanic African American: Yes     Diabetic: No     Tobacco smoker: No     Systolic Blood Pressure: 146 mmHg     Is BP treated: Yes     HDL Cholesterol: 50.6 mg/dL     Total Cholesterol: 226 mg/dL

## 2017-10-20 ENCOUNTER — Other Ambulatory Visit: Payer: Self-pay | Admitting: Family Medicine

## 2017-10-20 DIAGNOSIS — D649 Anemia, unspecified: Secondary | ICD-10-CM

## 2017-10-20 MED ORDER — FERROUS SULFATE 325 (65 FE) MG PO TABS: 325.0000 mg | ORAL_TABLET | Freq: Every day | ORAL | 1 refills | Status: DC

## 2017-11-20 ENCOUNTER — Other Ambulatory Visit: Payer: TRICARE For Life (TFL)

## 2017-12-11 ENCOUNTER — Other Ambulatory Visit (INDEPENDENT_AMBULATORY_CARE_PROVIDER_SITE_OTHER): Payer: Medicare Other

## 2017-12-11 DIAGNOSIS — D649 Anemia, unspecified: Secondary | ICD-10-CM | POA: Diagnosis not present

## 2017-12-11 LAB — CBC
HCT: 35.6 % — ABNORMAL LOW (ref 36.0–46.0)
Hemoglobin: 11.8 g/dL — ABNORMAL LOW (ref 12.0–15.0)
MCHC: 33.1 g/dL (ref 30.0–36.0)
MCV: 85.5 fl (ref 78.0–100.0)
PLATELETS: 236 10*3/uL (ref 150.0–400.0)
RBC: 4.16 Mil/uL (ref 3.87–5.11)
RDW: 15.1 % (ref 11.5–15.5)
WBC: 8.1 10*3/uL (ref 4.0–10.5)

## 2017-12-12 LAB — IRON,TIBC AND FERRITIN PANEL
%SAT: 20 % (ref 11–50)
FERRITIN: 54 ng/mL (ref 20–288)
IRON: 65 ug/dL (ref 45–160)
TIBC: 320 ug/dL (ref 250–450)

## 2018-01-17 DIAGNOSIS — H52223 Regular astigmatism, bilateral: Secondary | ICD-10-CM | POA: Diagnosis not present

## 2018-01-17 DIAGNOSIS — H2513 Age-related nuclear cataract, bilateral: Secondary | ICD-10-CM | POA: Diagnosis not present

## 2018-01-17 DIAGNOSIS — I10 Essential (primary) hypertension: Secondary | ICD-10-CM | POA: Diagnosis not present

## 2018-01-17 DIAGNOSIS — H5213 Myopia, bilateral: Secondary | ICD-10-CM | POA: Diagnosis not present

## 2018-01-17 DIAGNOSIS — H00024 Hordeolum internum left upper eyelid: Secondary | ICD-10-CM | POA: Diagnosis not present

## 2018-01-19 ENCOUNTER — Other Ambulatory Visit: Payer: Self-pay

## 2018-01-19 ENCOUNTER — Ambulatory Visit (INDEPENDENT_AMBULATORY_CARE_PROVIDER_SITE_OTHER): Payer: Medicare Other | Admitting: Family Medicine

## 2018-01-19 ENCOUNTER — Encounter: Payer: Self-pay | Admitting: Family Medicine

## 2018-01-19 VITALS — BP 140/80 | HR 92 | Temp 98.3°F | Wt 213.6 lb

## 2018-01-19 DIAGNOSIS — E785 Hyperlipidemia, unspecified: Secondary | ICD-10-CM | POA: Diagnosis not present

## 2018-01-19 DIAGNOSIS — D649 Anemia, unspecified: Secondary | ICD-10-CM

## 2018-01-19 DIAGNOSIS — M1611 Unilateral primary osteoarthritis, right hip: Secondary | ICD-10-CM

## 2018-01-19 DIAGNOSIS — I10 Essential (primary) hypertension: Secondary | ICD-10-CM

## 2018-01-19 LAB — BASIC METABOLIC PANEL
BUN: 15 mg/dL (ref 6–23)
CHLORIDE: 105 meq/L (ref 96–112)
CO2: 27 meq/L (ref 19–32)
CREATININE: 0.85 mg/dL (ref 0.40–1.20)
Calcium: 9.7 mg/dL (ref 8.4–10.5)
GFR: 86.29 mL/min (ref >60.00)
Glucose, Bld: 91 mg/dL (ref 70–99)
POTASSIUM: 3.8 meq/L (ref 3.5–5.1)
Sodium: 141 mEq/L (ref 135–145)

## 2018-01-19 MED ORDER — CELECOXIB 200 MG PO CAPS: 200.0000 mg | ORAL_CAPSULE | Freq: Two times a day (BID) | ORAL | 0 refills | Status: DC | PRN

## 2018-01-19 NOTE — Assessment & Plan Note (Signed)
Severe degenerative changes.  She would likely benefit from hip replacement though she does not want to do this.  She has tolerated Celebrex thus far and it has been beneficial.  We will check renal function and refill her Celebrex.  Advised to take with food.  Advised this could increase her blood pressure.

## 2018-01-19 NOTE — Assessment & Plan Note (Signed)
Patient declines medication.  She wants to work on diet and exercise.

## 2018-01-19 NOTE — Assessment & Plan Note (Signed)
Slightly above goal at home.  Advised increasing medication or working on diet and exercise and rechecking in 3 months.  Patient opted for diet and exercise.  We will see her back in August.

## 2018-01-19 NOTE — Patient Instructions (Signed)
Nice to see you. We will check lab work today and contact you with the results. You can continue the Celebrex though please take this with food.  Please take this as needed. Please work on diet and exercise.

## 2018-01-19 NOTE — Assessment & Plan Note (Signed)
Long history of mild anemia.  This may be her baseline.  Iron studies are in a pattern of anemia of chronic disease.  She no longer needs the iron supplementation.  She will have a colonoscopy to get her colon cancer screening up-to-date.  She defers mammogram later this year.  Pap smear up-to-date.

## 2018-01-19 NOTE — Progress Notes (Signed)
Marikay Alar, MD Phone: [phone]  Mary Wolfe is a 67 y.o. female who presents today for f/u.  HYPERTENSION  Disease Monitoring  Home BP Monitoring typically 130s over 80s-90s. Chest pain- no    Dyspnea- no Medications  Compliance-  Taking amlodipine.  Edema- no  hyperlipidemia: She is going to start going to [employer].  She is doing her physical therapy exercises for her right hip at home.  She is eating better.  No sweets.  Eating vegetables and salads.  Doing weight watchers.  Osteoarthritis of the right hip: She was seen at the walk-in clinic and given a Toradol shot and started on Celebrex.  That has been beneficial.  She has not been taking it every day.  Does take it with food.  Helps reduce her pain from 7/10-3-4/10.  Tylenol does not help much.  Does not give out on her.  Physical therapy exercises are helpful.  No history of cardiac disease, gastric ulcers, or kidney disease.  She is no longer on iron supplementation.  She has a very long history of anemia going back to her high school years.  It has been stable and is not consistent with iron deficiency anemia.  She does have a colonoscopy set up for colon cancer screening.  Mammogram and Pap smear up-to-date.  Social History   Tobacco Use  Smoking Status Never Smoker  Smokeless Tobacco Never Used     ROS see history of present illness  Objective  Physical Exam Vitals:   01/19/18 0836  BP: 140/80  Pulse: 92  Temp: 98.3 F (36.8 C)  SpO2: 99%    BP Readings from Last 3 Encounters:  01/19/18 140/80  10/18/17 (!) 146/88  12/21/16 130/90   Wt Readings from Last 3 Encounters:  01/19/18 213 lb 9.6 oz (96.9 kg)  10/18/17 212 lb (96.2 kg)  12/21/16 207 lb 3.2 oz (94 kg)    Physical Exam  Constitutional: No distress.  Cardiovascular: Normal rate, regular rhythm and normal heart sounds.  Pulmonary/Chest: Effort normal and breath sounds normal.  Musculoskeletal: She exhibits no edema.  Decreased  internal and external range of motion right hip with discomfort, left hip with full range of motion with no discomfort  Neurological: She is alert.  Skin: Skin is warm and dry. She is not diaphoretic.     Assessment/Plan: Please see individual problem list.  Essential hypertension Slightly above goal at home.  Advised increasing medication or working on diet and exercise and rechecking in 3 months.  Patient opted for diet and exercise.  We will see her back in August.  Osteoarthritis of right hip Severe degenerative changes.  She would likely benefit from hip replacement though she does not want to do this.  She has tolerated Celebrex thus far and it has been beneficial.  We will check renal function and refill her Celebrex.  Advised to take with food.  Advised this could increase her blood pressure.  Hyperlipidemia Patient declines medication.  She wants to work on diet and exercise.  Anemia Long history of mild anemia.  This may be her baseline.  Iron studies are in a pattern of anemia of chronic disease.  She no longer needs the iron supplementation.  She will have a colonoscopy to get her colon cancer screening up-to-date.  She defers mammogram later this year.  Pap smear up-to-date.   Orders Placed This Encounter  Procedures  . Basic Metabolic Panel (BMET)    Meds ordered this encounter  Medications  .  celecoxib (CELEBREX) 200 MG capsule    Sig: Take 1 capsule (200 mg total) by mouth 2 (two) times daily as needed for mild pain.    Dispense:  60 capsule    Refill:  0     Marikay Alar, MD [facility]

## 2018-04-02 DIAGNOSIS — D509 Iron deficiency anemia, unspecified: Secondary | ICD-10-CM | POA: Diagnosis not present

## 2018-04-02 DIAGNOSIS — Z1211 Encounter for screening for malignant neoplasm of colon: Secondary | ICD-10-CM | POA: Diagnosis not present

## 2018-04-23 ENCOUNTER — Encounter: Payer: Self-pay | Admitting: Family Medicine

## 2018-04-23 ENCOUNTER — Other Ambulatory Visit: Payer: Self-pay

## 2018-04-23 ENCOUNTER — Ambulatory Visit (INDEPENDENT_AMBULATORY_CARE_PROVIDER_SITE_OTHER): Payer: Medicare Other | Admitting: Family Medicine

## 2018-04-23 VITALS — BP 132/88 | HR 75 | Temp 98.1°F | Wt 205.4 lb

## 2018-04-23 DIAGNOSIS — D649 Anemia, unspecified: Secondary | ICD-10-CM | POA: Diagnosis not present

## 2018-04-23 DIAGNOSIS — M1611 Unilateral primary osteoarthritis, right hip: Secondary | ICD-10-CM | POA: Diagnosis not present

## 2018-04-23 DIAGNOSIS — E785 Hyperlipidemia, unspecified: Secondary | ICD-10-CM | POA: Diagnosis not present

## 2018-04-23 DIAGNOSIS — I10 Essential (primary) hypertension: Secondary | ICD-10-CM | POA: Diagnosis not present

## 2018-04-23 LAB — CBC
HCT: 37.3 % (ref 36.0–46.0)
HEMOGLOBIN: 12.4 g/dL (ref 12.0–15.0)
MCHC: 33.3 g/dL (ref 30.0–36.0)
MCV: 86.9 fl (ref 78.0–100.0)
Platelets: 198 10*3/uL (ref 150.0–400.0)
RBC: 4.29 Mil/uL (ref 3.87–5.11)
RDW: 15.9 % — AB (ref 11.5–15.5)
WBC: 5.4 10*3/uL (ref 4.0–10.5)

## 2018-04-23 MED ORDER — CELECOXIB 200 MG PO CAPS: 200.0000 mg | ORAL_CAPSULE | Freq: Every day | ORAL | 0 refills | Status: DC | PRN

## 2018-04-23 MED ORDER — AMLODIPINE BESYLATE 5 MG PO TABS: 5.0000 mg | ORAL_TABLET | Freq: Every day | ORAL | 3 refills | Status: DC

## 2018-04-23 NOTE — Progress Notes (Signed)
  Marikay AlarEric Hadriel Northup, MD Phone: [phone]  Mary FortDenise C Wolfe is a 67 y.o. female who presents today for follow-up.  CC: Hypertension, hyperlipidemia, osteoarthritis, anemia  HYPERTENSION  Disease Monitoring  Home BP Monitoring 125/88 Chest pain- no    Dyspnea- no Medications  Compliance-  Taking amlodipine.  Edema- no  Hyperlipidemia: She is riding her bike at home.  This is somewhat limited by her hip osteoarthritis.  She has been working on dietary changes with good benefit.  She is eating salads with oil and vinegar and only vegetables.  Grilled meats.  Hip osteoarthritis: Patient notes the pain is still there.  She uses a cane to ambulate.  She has been taking Aleve or Celebrex.  They are equally effective.  Patient is going to be undergoing colonoscopy and EGD.  They are evaluating her for a source of bleeding that is leading to her chronic anemia.  She reports her anemia has been chronic for her whole life.  She is no longer taking an iron supplement.  Social History   Tobacco Use  Smoking Status Never Smoker  Smokeless Tobacco Never Used     ROS see history of present illness  Objective  Physical Exam Vitals:   04/23/18 0919  BP: 132/88  Pulse: 75  Temp: 98.1 F (36.7 C)  SpO2: 98%    BP Readings from Last 3 Encounters:  04/23/18 132/88  01/19/18 140/80  10/18/17 (!) 146/88   Wt Readings from Last 3 Encounters:  04/23/18 205 lb 6.4 oz (93.2 kg)  01/19/18 213 lb 9.6 oz (96.9 kg)  10/18/17 212 lb (96.2 kg)    Physical Exam  Constitutional: No distress.  Cardiovascular: Normal rate, regular rhythm and normal heart sounds.  Pulmonary/Chest: Effort normal and breath sounds normal.  Musculoskeletal: She exhibits no edema.  Neurological: She is alert.  Skin: Skin is warm and dry. She is not diaphoretic.     Assessment/Plan: Please see individual problem list.  Essential hypertension Discussed increasing her medication with a goal of 130/80 or less though  she declines this.  She will work on dietary changes and exercise.  Continue amlodipine.  Osteoarthritis of right hip Celebrex has been helpful in the past.  We will refill this.  She does not want to see a Careers advisersurgeon.  Anemia Recheck labs.  She will undergo colonoscopy and EGD.  Hyperlipidemia Continue diet and exercise.   Health Maintenance: Patient declined DEXA scan and pneumonia vaccine.  Orders Placed This Encounter  Procedures  . CBC  . Iron, TIBC and Ferritin Panel    Meds ordered this encounter  Medications  . celecoxib (CELEBREX) 200 MG capsule    Sig: Take 1 capsule (200 mg total) by mouth daily as needed for mild pain.    Dispense:  90 capsule    Refill:  0  . amLODipine (NORVASC) 5 MG tablet    Sig: Take 1 tablet (5 mg total) by mouth daily.    Dispense:  90 tablet    Refill:  3     Marikay AlarEric Dilia Alemany, MD [facility]

## 2018-04-23 NOTE — Patient Instructions (Addendum)
Nice to see you. If you would like to do the bone density scan please let us know. If you are willing to go up on the amlodipine please let us know as well. Please continue to work on diet and exercise.

## 2018-04-24 LAB — IRON,TIBC AND FERRITIN PANEL
%SAT: 28 % (ref 16–45)
FERRITIN: 371 ng/mL — AB (ref 16–288)
IRON: 84 ug/dL (ref 45–160)
TIBC: 304 mcg/dL (calc) (ref 250–450)

## 2018-04-24 NOTE — Assessment & Plan Note (Signed)
Discussed increasing her medication with a goal of 130/80 or less though she declines this.  She will work on dietary changes and exercise.  Continue amlodipine.

## 2018-04-24 NOTE — Assessment & Plan Note (Signed)
Recheck labs.  She will undergo colonoscopy and EGD.

## 2018-04-24 NOTE — Assessment & Plan Note (Signed)
Continue diet and exercise. 

## 2018-04-24 NOTE — Assessment & Plan Note (Signed)
Celebrex has been helpful in the past.  We will refill this.  She does not want to see a Careers advisersurgeon.

## 2018-06-25 ENCOUNTER — Encounter
Admission: RE | Disposition: A | Discharge status: Home / Self Care | Payer: Self-pay | Source: Ambulatory Visit | Attending: Gastroenterology

## 2018-06-25 ENCOUNTER — Ambulatory Visit: Payer: Medicare Other | Admitting: Anesthesiology

## 2018-06-25 ENCOUNTER — Encounter: Payer: Self-pay | Admitting: *Deleted

## 2018-06-25 ENCOUNTER — Ambulatory Visit
Admission: RE | Admit: 2018-06-25 | Discharge: 2018-06-25 | Disposition: A | Discharge status: Home / Self Care | Payer: Medicare Other | Source: Ambulatory Visit | Attending: Gastroenterology | Admitting: Gastroenterology

## 2018-06-25 DIAGNOSIS — K2980 Duodenitis without bleeding: Secondary | ICD-10-CM | POA: Insufficient documentation

## 2018-06-25 DIAGNOSIS — Z1211 Encounter for screening for malignant neoplasm of colon: Secondary | ICD-10-CM | POA: Insufficient documentation

## 2018-06-25 DIAGNOSIS — K2970 Gastritis, unspecified, without bleeding: Secondary | ICD-10-CM | POA: Diagnosis not present

## 2018-06-25 DIAGNOSIS — K640 First degree hemorrhoids: Secondary | ICD-10-CM | POA: Insufficient documentation

## 2018-06-25 DIAGNOSIS — K2950 Unspecified chronic gastritis without bleeding: Secondary | ICD-10-CM | POA: Diagnosis not present

## 2018-06-25 DIAGNOSIS — K209 Esophagitis, unspecified: Secondary | ICD-10-CM | POA: Diagnosis not present

## 2018-06-25 DIAGNOSIS — I10 Essential (primary) hypertension: Secondary | ICD-10-CM | POA: Insufficient documentation

## 2018-06-25 DIAGNOSIS — D509 Iron deficiency anemia, unspecified: Secondary | ICD-10-CM | POA: Insufficient documentation

## 2018-06-25 DIAGNOSIS — Z91048 Other nonmedicinal substance allergy status: Secondary | ICD-10-CM | POA: Insufficient documentation

## 2018-06-25 DIAGNOSIS — K5730 Diverticulosis of large intestine without perforation or abscess without bleeding: Secondary | ICD-10-CM | POA: Diagnosis not present

## 2018-06-25 DIAGNOSIS — Z79899 Other long term (current) drug therapy: Secondary | ICD-10-CM | POA: Diagnosis not present

## 2018-06-25 DIAGNOSIS — K259 Gastric ulcer, unspecified as acute or chronic, without hemorrhage or perforation: Secondary | ICD-10-CM | POA: Diagnosis not present

## 2018-06-25 DIAGNOSIS — K3189 Other diseases of stomach and duodenum: Secondary | ICD-10-CM | POA: Diagnosis not present

## 2018-06-25 DIAGNOSIS — E669 Obesity, unspecified: Secondary | ICD-10-CM | POA: Diagnosis not present

## 2018-06-25 DIAGNOSIS — K648 Other hemorrhoids: Secondary | ICD-10-CM | POA: Diagnosis not present

## 2018-06-25 DIAGNOSIS — G8929 Other chronic pain: Secondary | ICD-10-CM | POA: Diagnosis not present

## 2018-06-25 DIAGNOSIS — K208 Other esophagitis: Secondary | ICD-10-CM | POA: Diagnosis not present

## 2018-06-25 DIAGNOSIS — K210 Gastro-esophageal reflux disease with esophagitis: Secondary | ICD-10-CM | POA: Diagnosis not present

## 2018-06-25 DIAGNOSIS — K5790 Diverticulosis of intestine, part unspecified, without perforation or abscess without bleeding: Secondary | ICD-10-CM | POA: Diagnosis not present

## 2018-06-25 DIAGNOSIS — E785 Hyperlipidemia, unspecified: Secondary | ICD-10-CM | POA: Diagnosis not present

## 2018-06-25 DIAGNOSIS — Z6835 Body mass index (BMI) 35.0-35.9, adult: Secondary | ICD-10-CM | POA: Diagnosis not present

## 2018-06-25 HISTORY — PX: COLONOSCOPY WITH PROPOFOL: SHX5780

## 2018-06-25 HISTORY — PX: ESOPHAGOGASTRODUODENOSCOPY (EGD) WITH PROPOFOL: SHX5813

## 2018-06-25 HISTORY — DX: Anemia, unspecified: D64.9

## 2018-06-25 LAB — HM COLONOSCOPY

## 2018-06-25 SURGERY — COLONOSCOPY WITH PROPOFOL
Anesthesia: General

## 2018-06-25 MED ORDER — LIDOCAINE HCL (PF) 2 % IJ SOLN
INTRAMUSCULAR | Status: AC
  Filled 2018-06-25: qty 10

## 2018-06-25 MED ORDER — SODIUM CHLORIDE 0.9 % IV SOLN
INTRAVENOUS | Status: DC
  Administered 2018-06-25: 1000 mL via INTRAVENOUS

## 2018-06-25 MED ORDER — PROPOFOL 500 MG/50ML IV EMUL
INTRAVENOUS | Status: DC | PRN
  Administered 2018-06-25: 50 ug/kg/min via INTRAVENOUS

## 2018-06-25 MED ORDER — GLYCOPYRROLATE 0.2 MG/ML IJ SOLN
INTRAMUSCULAR | Status: DC | PRN
  Administered 2018-06-25: 0.2 mg via INTRAVENOUS

## 2018-06-25 MED ORDER — PROPOFOL 500 MG/50ML IV EMUL
INTRAVENOUS | Status: AC
  Filled 2018-06-25: qty 50

## 2018-06-25 MED ORDER — PROPOFOL 10 MG/ML IV BOLUS
INTRAVENOUS | Status: DC | PRN
  Administered 2018-06-25: 20 mg via INTRAVENOUS
  Administered 2018-06-25: 30 mg via INTRAVENOUS

## 2018-06-25 MED ORDER — FENTANYL CITRATE (PF) 100 MCG/2ML IJ SOLN
INTRAMUSCULAR | Status: AC
  Filled 2018-06-25: qty 2

## 2018-06-25 MED ORDER — MIDAZOLAM HCL 2 MG/2ML IJ SOLN
INTRAMUSCULAR | Status: AC
  Filled 2018-06-25: qty 2

## 2018-06-25 MED ORDER — FENTANYL CITRATE (PF) 100 MCG/2ML IJ SOLN
INTRAMUSCULAR | Status: DC | PRN
  Administered 2018-06-25 (×4): 25 ug via INTRAVENOUS

## 2018-06-25 MED ORDER — GLYCOPYRROLATE 0.2 MG/ML IJ SOLN
INTRAMUSCULAR | Status: AC
  Filled 2018-06-25: qty 1

## 2018-06-25 MED ORDER — LIDOCAINE HCL (PF) 2 % IJ SOLN
INTRAMUSCULAR | Status: DC | PRN
  Administered 2018-06-25: 90 mg

## 2018-06-25 MED ORDER — MIDAZOLAM HCL 5 MG/5ML IJ SOLN
INTRAMUSCULAR | Status: DC | PRN
  Administered 2018-06-25: 2 mg via INTRAVENOUS

## 2018-06-25 NOTE — Anesthesia Post-op Follow-up Note (Signed)
Anesthesia QCDR form completed.        

## 2018-06-25 NOTE — Transfer of Care (Signed)
Immediate Anesthesia Transfer of Care Note  Patient: Mary Wolfe  Procedure(s) Performed: COLONOSCOPY WITH PROPOFOL (N/A ) ESOPHAGOGASTRODUODENOSCOPY (EGD) WITH PROPOFOL (N/A )  Patient Location: PACU  Anesthesia Type:General  Level of Consciousness: sedated  Airway & Oxygen Therapy: Patient Spontanous Breathing and Patient connected to nasal cannula oxygen  Post-op Assessment: Report given to RN and Post -op Vital signs reviewed and stable  Post vital signs: Reviewed and stable  Last Vitals:  Vitals Value Taken Time  BP 107/66 06/25/2018  9:27 AM  Temp 36.1 C 06/25/2018  9:27 AM  Pulse 91 06/25/2018  9:28 AM  Resp 13 06/25/2018  9:28 AM  SpO2 100 % 06/25/2018  9:28 AM  Vitals shown include unvalidated device data.  Last Pain:  Vitals:   06/25/18 0927  TempSrc: Tympanic  PainSc: 0-No pain         Complications: No apparent anesthesia complications

## 2018-06-25 NOTE — Op Note (Signed)
[facility] Patient Name: Mary Wolfe Procedure Date: 06/25/2018 8:29 AM MRN: [id number] Account #: [id number] Date of Birth: 1951-06-29 Admit Type: Outpatient Age: 67 Room: [facility] ENDO ROOM 1 Gender: Female Note Status: Finalized Procedure:            Upper GI endoscopy Indications:          Iron deficiency anemia Providers:            Christena Deem, MD Referring MD:         Yehuda Mao. Birdie Sons (Referring MD) Medicines:            Monitored Anesthesia Care Complications:        No immediate complications. Procedure:            Pre-Anesthesia Assessment:                       - ASA Grade Assessment: III - A patient with severe                        systemic disease.                       After obtaining informed consent, the endoscope was                        passed under direct vision. Throughout the procedure,                        the patient's blood pressure, pulse, and oxygen                        saturations were monitored continuously. The Endoscope                        was introduced through the mouth, and advanced to the                        third part of duodenum. The patient tolerated the                        procedure well. Findings:      LA Grade B (one or more mucosal breaks greater than 5 mm, not extending       between the tops of two mucosal folds) esophagitis with no bleeding was       found. Biopsies were taken with a cold forceps for histology.      The exam of the esophagus was otherwise normal.      Patchy moderate inflammation characterized by adherent blood, congestion       (edema), erosions, erythema and shallow ulcerations was found in the       gastric antrum. Biopsies were taken with a cold forceps for histology.      Diffuse mild inflammation characterized by congestion (edema) and       erythema was found in the gastric body. Biopsies were taken with a cold       forceps for histology.      Patchy  minimal inflammation characterized by erythema was found in the       duodenal bulb.      The exam of the duodenum was otherwise normal.      The cardia and gastric  fundus were normal on retroflexion otherwise. Impression:           - LA Grade B erosive esophagitis. Biopsied.                       - Erosive gastritis. Biopsied.                       - Gastritis. Biopsied.                       - Duodenitis. Recommendation:       - Await pathology results.                       - Use Protonix (pantoprazole) 40 mg PO BID for 4 weeks.                       - Use Protonix (pantoprazole) 40 mg PO daily daily. Procedure Code(s):    --- Professional ---                       [phone], Esophagogastroduodenoscopy, flexible, transoral;                        with biopsy, single or multiple Diagnosis Code(s):    --- Professional ---                       K20.8, Other esophagitis                       K29.60, Other gastritis without bleeding                       K29.70, Gastritis, unspecified, without bleeding                       K29.80, Duodenitis without bleeding                       D50.9, Iron deficiency anemia, unspecified CPT copyright 2018 American Medical Association. All rights reserved. The codes documented in this report are preliminary and upon coder review may  be revised to meet current compliance requirements. Christena Deem, MD 06/25/2018 9:07:14 AM This report has been signed electronically. Number of Addenda: 0 Note Initiated On: 06/25/2018 8:29 AM      [facility]

## 2018-06-25 NOTE — Op Note (Addendum)
[facility] Gastroenterology Patient Name: Mary Wolfe Procedure Date: 06/25/2018 8:29 AM MRN: [id number] Account #: [id number] Date of Birth: 1951-05-08 Admit Type: Outpatient Age: 67 Room: [facility] ENDO ROOM 1 Gender: Female Note Status: Finalized Procedure:            Colonoscopy Indications:          Screening for colorectal malignant neoplasm Providers:            Christena Deem, MD Referring MD:         Yehuda Mao. Birdie Sons (Referring MD) Medicines:            Monitored Anesthesia Care Complications:        No immediate complications. Procedure:            Pre-Anesthesia Assessment:                       - ASA Grade Assessment: III - A patient with severe                        systemic disease.                       After obtaining informed consent, the colonoscope was                        passed under direct vision. Throughout the procedure,                        the patient's blood pressure, pulse, and oxygen                        saturations were monitored continuously. The                        Colonoscope was introduced through the anus and                        advanced to the the cecum, identified by appendiceal                        orifice and ileocecal valve. The colonoscopy was                        performed without difficulty. The patient tolerated the                        procedure well. The quality of the bowel preparation                        was good. Findings:      Many small and large-mouthed diverticula were found in the sigmoid       colon, descending colon, transverse colon, ascending colon and cecum.      Non-bleeding internal hemorrhoids were found during retroflexion and       during anoscopy. The hemorrhoids were small and Grade I (internal       hemorrhoids that do not prolapse).      The exam was otherwise normal throughout the examined colon. Impression:           - Diverticulosis in the sigmoid colon, in the  descending colon, in the transverse colon, in the                        ascending colon and in the cecum.                       - Non-bleeding internal hemorrhoids.                       - No specimens collected. Recommendation:       - Discharge patient to home.                       - Repeat colonoscopy in 10 years for screening purposes. Procedure Code(s):    --- Professional ---                       [phone], Colonoscopy, flexible; diagnostic, including                        collection of specimen(s) by brushing or washing, when                        performed (separate procedure) Diagnosis Code(s):    --- Professional ---                       Z12.11, Encounter for screening for malignant neoplasm                        of colon                       K64.0, First degree hemorrhoids                       K57.30, Diverticulosis of large intestine without                        perforation or abscess without bleeding CPT copyright 2018 American Medical Association. All rights reserved. The codes documented in this report are preliminary and upon coder review may  be revised to meet current compliance requirements. Christena Deem, MD 06/25/2018 9:27:42 AM This report has been signed electronically. Number of Addenda: 0 Note Initiated On: 06/25/2018 8:29 AM Scope Withdrawal Time: 0 hours 9 minutes 52 seconds  Total Procedure Duration: 0 hours 15 minutes 44 seconds       [facility]

## 2018-06-25 NOTE — Anesthesia Postprocedure Evaluation (Signed)
Anesthesia Post Note  Patient: Mary Wolfe  Procedure(s) Performed: COLONOSCOPY WITH PROPOFOL (N/A ) ESOPHAGOGASTRODUODENOSCOPY (EGD) WITH PROPOFOL (N/A )  Patient location during evaluation: Endoscopy Anesthesia Type: General Level of consciousness: awake and alert Pain management: pain level controlled Vital Signs Assessment: post-procedure vital signs reviewed and stable Respiratory status: spontaneous breathing, nonlabored ventilation, respiratory function stable and patient connected to nasal cannula oxygen Cardiovascular status: blood pressure returned to baseline and stable Postop Assessment: no apparent nausea or vomiting Anesthetic complications: no     Last Vitals:  Vitals:   06/25/18 0927 06/25/18 0947  BP: 107/66 132/81  Pulse: 92   Resp: 12   Temp: (!) 36.1 C   SpO2: 100%     Last Pain:  Vitals:   06/25/18 0927  TempSrc: Tympanic  PainSc: 0-No pain                 Cleda Mccreedy Piscitello

## 2018-06-25 NOTE — H&P (Signed)
Outpatient short stay form Pre-procedure 06/25/2018 8:34 AM Christena Deem MD  Primary Physician: Dr. Marikay Alar  Reason for visit: EGD and colonoscopy  History of present illness: Patient is a 67 year old female presenting today as above.  She has a history of iron deficiency anemia and her last colonoscopy was about 10 years ago.  No family history of colon polyps or colon cancer.  He has a history of iron deficiency anemia that has improved with iron supplementation however patient has been taking daily NSAIDs either Mobic or Aleve and Goody powders on a daily basis multiple doses daily.  This is essentially for her chronic hip pain.  Tolerated her prep well.  She is taken no aspirin products including her Marlin Canary powders for about a week.  Aches no blood thinners.    Current Facility-Administered Medications:  .  0.9 %  sodium chloride infusion, , Intravenous, Continuous, Christena Deem, MD, Last Rate: 20 mL/hr at 06/25/18 0828, 1,000 mL at 06/25/18 9528  Medications Prior to Admission  Medication Sig Dispense Refill Last Dose  . amLODipine (NORVASC) 5 MG tablet Take 1 tablet (5 mg total) by mouth daily. 90 tablet 3 06/25/2018 at Unknown time  . Naproxen Sodium (ALEVE PO) Take by mouth.     . celecoxib (CELEBREX) 200 MG capsule Take 1 capsule (200 mg total) by mouth daily as needed for mild pain. 90 capsule 0   . ferrous sulfate 325 (65 FE) MG tablet Take 1 tablet (325 mg total) by mouth daily with breakfast. (Patient not taking: Reported on 04/23/2018) 30 tablet 1 Not Taking     Allergies  Allergen Reactions  . Nickel Rash     Past Medical History:  Diagnosis Date  . Anemia    IDA  . Diverticulitis   . Hyperlipidemia   . Hypertension     Review of systems:      Physical Exam    Heart and lungs: Regular rate and rhythm without rub or gallop, lungs are bilaterally clear.    HEENT: Normocephalic atraumatic eyes are anicteric    Other:    Pertinant exam for  procedure: Soft nontender nondistended bowel sounds positive normoactive.    Planned proceedures: EGD, colonoscopy and indicated procedures. I have discussed the risks benefits and complications of procedures to include not limited to bleeding, infection, perforation and the risk of sedation and the patient wishes to proceed.    Christena Deem, MD Gastroenterology 06/25/2018  8:34 AM

## 2018-06-25 NOTE — Anesthesia Preprocedure Evaluation (Signed)
Anesthesia Evaluation  Patient identified by MRN, date of birth, ID band Patient awake    Reviewed: Allergy & Precautions, H&P , NPO status , Patient's Chart, lab work & pertinent test results  Airway Mallampati: III  TM Distance: >3 FB Neck ROM: full    Dental  (+) Chipped, Poor Dentition   Pulmonary neg pulmonary ROS, neg shortness of breath,           Cardiovascular Exercise Tolerance: Good hypertension, (-) angina(-) Past MI      Neuro/Psych negative neurological ROS  negative psych ROS   GI/Hepatic negative GI ROS, Neg liver ROS, neg GERD  ,  Endo/Other  negative endocrine ROS  Renal/GU negative Renal ROS  negative genitourinary   Musculoskeletal  (+) Arthritis ,   Abdominal   Peds  Hematology negative hematology ROS (+)   Anesthesia Other Findings Past Medical History: No date: Anemia     Comment:  IDA No date: Diverticulitis No date: Hyperlipidemia No date: Hypertension  Past Surgical History: No date: CARPAL TUNNEL RELEASE; Right No date: CESAREAN SECTION 03/14/2008: COLONOSCOPY 04/01/1997: ESOPHAGOGASTRODUODENOSCOPY No date: TONSILLECTOMY  BMI    Body Mass Index:  35.82 kg/m      Reproductive/Obstetrics negative OB ROS                             Anesthesia Physical Anesthesia Plan  ASA: III  Anesthesia Plan: General   Post-op Pain Management:    Induction: Intravenous  PONV Risk Score and Plan: Propofol infusion and TIVA  Airway Management Planned: Natural Airway and Nasal Cannula  Additional Equipment:   Intra-op Plan:   Post-operative Plan:   Informed Consent: I have reviewed the patients History and Physical, chart, labs and discussed the procedure including the risks, benefits and alternatives for the proposed anesthesia with the patient or authorized representative who has indicated his/her understanding and acceptance.   Dental Advisory  Given  Plan Discussed with: Anesthesiologist, CRNA and Surgeon  Anesthesia Plan Comments: (Patient consented for risks of anesthesia including but not limited to:  - adverse reactions to medications - risk of intubation if required - damage to teeth, lips or other oral mucosa - sore throat or hoarseness - Damage to heart, brain, lungs or loss of life  Patient voiced understanding.)        Anesthesia Quick Evaluation

## 2018-06-26 ENCOUNTER — Encounter: Payer: Self-pay | Admitting: Gastroenterology

## 2018-06-26 LAB — SURGICAL PATHOLOGY

## 2018-07-25 DIAGNOSIS — D509 Iron deficiency anemia, unspecified: Secondary | ICD-10-CM | POA: Diagnosis not present

## 2018-10-24 ENCOUNTER — Ambulatory Visit: Payer: TRICARE For Life (TFL) | Admitting: Family Medicine

## 2018-12-17 ENCOUNTER — Telehealth: Payer: Self-pay

## 2018-12-17 NOTE — Telephone Encounter (Signed)
Patient calling for virtual appointment.

## 2018-12-17 NOTE — Telephone Encounter (Signed)
Sent to [place] 

## 2018-12-17 NOTE — Telephone Encounter (Signed)
Copied from CRM [id number]. Topic: Appointment Scheduling - Scheduling Inquiry for Clinic >> Dec 14, 2018 11:40 AM Jens Som A wrote: CRM for notification. See Telephone encounter for: 12/14/18.  Patient is returning RY phone call regarding 4/1/520 appt telephone and email were verified.

## 2018-12-19 ENCOUNTER — Encounter: Payer: Self-pay | Admitting: Family Medicine

## 2018-12-19 ENCOUNTER — Ambulatory Visit (INDEPENDENT_AMBULATORY_CARE_PROVIDER_SITE_OTHER): Payer: Medicare Other | Admitting: Family Medicine

## 2018-12-19 ENCOUNTER — Telehealth: Payer: Self-pay | Admitting: Family Medicine

## 2018-12-19 ENCOUNTER — Other Ambulatory Visit: Payer: Self-pay

## 2018-12-19 DIAGNOSIS — M1611 Unilateral primary osteoarthritis, right hip: Secondary | ICD-10-CM

## 2018-12-19 DIAGNOSIS — D649 Anemia, unspecified: Secondary | ICD-10-CM | POA: Diagnosis not present

## 2018-12-19 DIAGNOSIS — I10 Essential (primary) hypertension: Secondary | ICD-10-CM | POA: Diagnosis not present

## 2018-12-19 MED ORDER — AMLODIPINE BESYLATE 10 MG PO TABS: 10.0000 mg | ORAL_TABLET | Freq: Every day | ORAL | 1 refills | Status: DC

## 2018-12-19 NOTE — Assessment & Plan Note (Signed)
She completed GI evaluation.  This was resolved at her last check.

## 2018-12-19 NOTE — Progress Notes (Signed)
Virtual Visit via video Note  This visit type was conducted due to national recommendations for restrictions regarding the COVID-19 pandemic (e.g. social distancing).  This format is felt to be most appropriate for this patient at this time.  All issues noted in this document were discussed and addressed.  No physical exam was performed (except for noted visual exam findings with Video Visits).   I connected with Mary Wolfe on 12/19/18 at  3:30 PM EDT by a video enabled telemedicine application and verified that I am speaking with the correct person using two identifiers. Location patient: home Location provider: work Persons participating in the virtual visit: patient, provider  I discussed the limitations, risks, security and privacy concerns of performing an evaluation and management service by video and the availability of in person appointments. I also discussed with the patient that there may be a patient responsible charge related to this service. The patient expressed understanding and agreed to proceed.   Reason for visit: f/u  HPI: HYPERTENSION  Disease Monitoring  Home BP Monitoring 128-140/90s Chest pain- no    Dyspnea- no Medications  Compliance-  Taking amlodipine.  Edema- minimal relate to her arthritis  Osteoarthritis: Patient wants to consider seeing a nonsurgical specialist to discuss nonsurgical options for her hip once  Anemia: She has been seen by GI.  She has completed evaluation through them that was unremarkable except for gastritis/duodenitis likely from NSAID use.  They discussed having her use her NSAIDs with food if she needed to take them.  She is no longer on a PPI.  She is no longer on iron supplementation.  She notes no abdominal pain.  She notes no blood in her stool.    ROS: See pertinent positives and negatives per HPI.  Past Medical History:  Diagnosis Date  . Anemia    IDA  . Diverticulitis   . Hyperlipidemia   . Hypertension     Past  Surgical History:  Procedure Laterality Date  . CARPAL TUNNEL RELEASE Right   . CESAREAN SECTION    . COLONOSCOPY  03/14/2008  . COLONOSCOPY WITH PROPOFOL N/A 06/25/2018   Procedure: COLONOSCOPY WITH PROPOFOL;  Surgeon: Christena Deem, MD;  Location: [facility] ENDOSCOPY;  Service: Endoscopy;  Laterality: N/A;  . ESOPHAGOGASTRODUODENOSCOPY  04/01/1997  . ESOPHAGOGASTRODUODENOSCOPY (EGD) WITH PROPOFOL N/A 06/25/2018   Procedure: ESOPHAGOGASTRODUODENOSCOPY (EGD) WITH PROPOFOL;  Surgeon: Christena Deem, MD;  Location[facility] ENDOSCOPY;  Service: Endoscopy;  Laterality: N/A;  . TONSILLECTOMY      Family History  Problem Relation Age of Onset  . Heart disease Unknown   . Arthritis Unknown   . Breast cancer Maternal Aunt 42    SOCIAL HX: Non-smoker.   Current Outpatient Medications:  .  amLODipine (NORVASC) 10 MG tablet, Take 1 tablet (10 mg total) by mouth daily., Disp: 90 tablet, Rfl: 1 .  celecoxib (CELEBREX) 200 MG capsule, Take 1 capsule (200 mg total) by mouth daily as needed for mild pain., Disp: 90 capsule, Rfl: 0 .  ferrous sulfate 325 (65 FE) MG tablet, Take 1 tablet (325 mg total) by mouth daily with breakfast., Disp: 30 tablet, Rfl: 1 .  Naproxen Sodium (ALEVE PO), Take by mouth., Disp: , Rfl:   EXAM:  VITALS per patient if applicable: None.  GENERAL: alert, oriented, appears well and in no acute distress  HEENT: atraumatic, conjunttiva clear, no obvious abnormalities on inspection of external nose and ears  NECK: normal movements of the head and neck  LUNGS: on  inspection no signs of respiratory distress, breathing rate appears normal, no obvious gross SOB, gasping or wheezing  CV: no obvious cyanosis  MS: moves all visible extremities without noticeable abnormality  PSYCH/NEURO: pleasant and cooperative, no obvious depression or anxiety, speech and thought processing grossly intact  ASSESSMENT AND PLAN:  Discussed the following assessment and  plan:  Essential hypertension  Primary osteoarthritis of right hip  Anemia, unspecified type  Essential hypertension Uncontrolled.  We will have her increase her amlodipine to 10 mg daily.  We will have her follow-up in 1 month.  Osteoarthritis of right hip Refer to sports medicine.  Discussed taking her naproxen with food.  Anemia She completed GI evaluation.  This was resolved at her last check.    I discussed the assessment and treatment plan with the patient. The patient was provided an opportunity to ask questions and all were answered. The patient agreed with the plan and demonstrated an understanding of the instructions.   The patient was advised to call back or seek an in-person evaluation if the symptoms worsen or if the condition fails to improve as anticipated.    Marikay AlarEric Myishia Kasik, MD

## 2018-12-19 NOTE — Assessment & Plan Note (Signed)
Refer to sports medicine.  Discussed taking her naproxen with food.

## 2018-12-19 NOTE — Telephone Encounter (Signed)
Please contact the patient and get her set up for a 1 month follow-up for her blood pressure.  Thanks.

## 2018-12-19 NOTE — Assessment & Plan Note (Signed)
Uncontrolled.  We will have her increase her amlodipine to 10 mg daily.  We will have her follow-up in 1 month.

## 2018-12-21 NOTE — Telephone Encounter (Signed)
Called and spoke with pt. Pt has been scheduled for Mat 13th, 2020 at 9:30 AM. Pt does have a way to check her BP at home will do this and keep documentation if virtual visit is still needed at that time.   Sent to PCP as an [place]

## 2019-01-16 ENCOUNTER — Other Ambulatory Visit: Payer: Self-pay

## 2019-01-16 ENCOUNTER — Ambulatory Visit (INDEPENDENT_AMBULATORY_CARE_PROVIDER_SITE_OTHER): Payer: Medicare Other | Admitting: Family Medicine

## 2019-01-16 ENCOUNTER — Encounter: Payer: Self-pay | Admitting: Family Medicine

## 2019-01-16 ENCOUNTER — Telehealth: Payer: Self-pay | Admitting: Family Medicine

## 2019-01-16 DIAGNOSIS — Z6837 Body mass index (BMI) 37.0-37.9, adult: Secondary | ICD-10-CM | POA: Diagnosis not present

## 2019-01-16 DIAGNOSIS — I10 Essential (primary) hypertension: Secondary | ICD-10-CM

## 2019-01-16 NOTE — Assessment & Plan Note (Signed)
Encouraged continued activity and dietary changes.

## 2019-01-16 NOTE — Telephone Encounter (Signed)
Please contact the patient and get her set up for a follow-up appointment in about 3 months.

## 2019-01-16 NOTE — Assessment & Plan Note (Signed)
Improved control.  Discussed goal diastolic blood pressure less than 90.  She will continue current medication.  She will try to stay active and monitor her diet.

## 2019-01-16 NOTE — Progress Notes (Signed)
Virtual Visit via video Note  This visit type was conducted due to national recommendations for restrictions regarding the COVID-19 pandemic (e.g. social distancing).  This format is felt to be most appropriate for this patient at this time.  All issues noted in this document were discussed and addressed.  No physical exam was performed (except for noted visual exam findings with Video Visits).   I connected with Mary Wolfe today at  9:30 AM EDT by a video enabled telemedicine application and verified that I am speaking with the correct person using two identifiers. Location patient: home Location provider: work or home office Persons participating in the virtual visit: patient, provider  I discussed the limitations, risks, security and privacy concerns of performing an evaluation and management service by telephone and the availability of in person appointments. I also discussed with the patient that there may be a patient responsible charge related to this service. The patient expressed understanding and agreed to proceed.  Reason for visit: follow-up  HPI: HYPERTENSION  Disease Monitoring  Home BP Monitoring 119-123/83-88 Chest pain- no    Dyspnea- no Medications  Compliance-  Taking amlodipine 10 mg daily.  Edema- no  Obesity: Patient has been doing some exercise with leg raises and bending.  She has been avoiding sweets.  She sees sports medicine next week for her hips.     ROS: See pertinent positives and negatives per HPI.  Past Medical History:  Diagnosis Date  . Anemia    IDA  . Diverticulitis   . Hyperlipidemia   . Hypertension     Past Surgical History:  Procedure Laterality Date  . CARPAL TUNNEL RELEASE Right   . CESAREAN SECTION    . COLONOSCOPY  03/14/2008  . COLONOSCOPY WITH PROPOFOL N/A 06/25/2018   Procedure: COLONOSCOPY WITH PROPOFOL;  Surgeon: Christena Deem, MD;  Location: [facility] ENDOSCOPY;  Service: Endoscopy;  Laterality: N/A;  .  ESOPHAGOGASTRODUODENOSCOPY  04/01/1997  . ESOPHAGOGASTRODUODENOSCOPY (EGD) WITH PROPOFOL N/A 06/25/2018   Procedure: ESOPHAGOGASTRODUODENOSCOPY (EGD) WITH PROPOFOL;  Surgeon: Christena Deem, MD;  Location: [facility] ENDOSCOPY;  Service: Endoscopy;  Laterality: N/A;  . TONSILLECTOMY      Family History  Problem Relation Age of Onset  . Heart disease Unknown   . Arthritis Unknown   . Breast cancer Maternal Aunt 53    SOCIAL HX: Non-smoker.   Current Outpatient Medications:  .  amLODipine (NORVASC) 10 MG tablet, Take 1 tablet (10 mg total) by mouth daily., Disp: 90 tablet, Rfl: 1 .  Naproxen Sodium (ALEVE PO), Take by mouth., Disp: , Rfl:  .  TURMERIC PO, Take 900 mg by mouth., Disp: , Rfl:  .  celecoxib (CELEBREX) 200 MG capsule, Take 1 capsule (200 mg total) by mouth daily as needed for mild pain. (Patient not taking: Reported on 01/16/2019), Disp: 90 capsule, Rfl: 0 .  ferrous sulfate 325 (65 FE) MG tablet, Take 1 tablet (325 mg total) by mouth daily with breakfast. (Patient not taking: Reported on 01/16/2019), Disp: 30 tablet, Rfl: 1  EXAM:  VITALS per patient if applicable: None.  GENERAL: alert, oriented, appears well and in no acute distress  HEENT: atraumatic, conjunttiva clear, no obvious abnormalities on inspection of external nose and ears  NECK: normal movements of the head and neck  LUNGS: on inspection no signs of respiratory distress, breathing rate appears normal, no obvious gross SOB, gasping or wheezing  CV: no obvious cyanosis  MS: moves all visible extremities without noticeable abnormality  PSYCH/NEURO:  pleasant and cooperative, no obvious depression or anxiety, speech and thought processing grossly intact  ASSESSMENT AND PLAN:  Discussed the following assessment and plan:  Essential hypertension  Class 2 severe obesity due to excess calories with serious comorbidity and body mass index (BMI) of 37.0 to 37.9 in adult [facility](HCC)  Essential hypertension  Improved control.  Discussed goal diastolic blood pressure less than 90.  She will continue current medication.  She will try to stay active and monitor her diet.  Obesity Encouraged continued activity and dietary changes.  CMA will contact patient to schedule follow-up in 2 to 3 months.  Social distancing precautions and sick precautions given regarding COVID-19.   I discussed the assessment and treatment plan with the patient. The patient was provided an opportunity to ask questions and all were answered. The patient agreed with the plan and demonstrated an understanding of the instructions.   The patient was advised to call back or seek an in-person evaluation if the symptoms worsen or if the condition fails to improve as anticipated.   Marikay AlarEric Britton Perkinson, MD

## 2019-01-18 NOTE — Telephone Encounter (Signed)
Appointment has been scheduled.

## 2019-01-20 NOTE — Progress Notes (Signed)
[facility] [address] [place], [place] [address] Phone: [phone] Subjective:    I'm seeing this patient by the request  of:  Glori Luis, MD   CC: Right hip pain  [id number]  Mary Wolfe is a 68 y.o. female coming in with complaint of right hip pain. Does not lay on her side at all. Walks with a cane.  Patient states that the pain seems to be worsening.  Has seen other providers for this.  Reviewing patient's note has done formal physical therapy 2 times since 2017.  Onset- 3-4 years Location- joint pain  Character- Sharp, dull, achy, sore, throbbing Aggravating factors- sitting, walking Reliving factors-  Therapies tried- ice, topicals, heat Severity-9 out of 10   Patient had x-rays done in 2017 mL.  These were independently visualized by me showing significant arthritic changes  Past Medical History:  Diagnosis Date  . Anemia    IDA  . Diverticulitis   . Hyperlipidemia   . Hypertension    Past Surgical History:  Procedure Laterality Date  . CARPAL TUNNEL RELEASE Right   . CESAREAN SECTION    . COLONOSCOPY  03/14/2008  . COLONOSCOPY WITH PROPOFOL N/A 06/25/2018   Procedure: COLONOSCOPY WITH PROPOFOL;  Surgeon: Christena Deem, MD;  Location: [facility] ENDOSCOPY;  Service: Endoscopy;  Laterality: N/A;  . ESOPHAGOGASTRODUODENOSCOPY  04/01/1997  . ESOPHAGOGASTRODUODENOSCOPY (EGD) WITH PROPOFOL N/A 06/25/2018   Procedure: ESOPHAGOGASTRODUODENOSCOPY (EGD) WITH PROPOFOL;  Surgeon: Christena Deem, MD;  Location: [facility] ENDOSCOPY;  Service: Endoscopy;  Laterality: N/A;  . TONSILLECTOMY     Social History   Socioeconomic History  . Marital status: Married    Spouse name: Not on file  . Number of children: Not on file  . Years of education: Not on file  . Highest education level: Not on file  Occupational History  . Not on file  Social Needs  . Financial resource strain: Not on file  . Food insecurity:    Worry: Not on  file    Inability: Not on file  . Transportation needs:    Medical: Not on file    Non-medical: Not on file  Tobacco Use  . Smoking status: Never Smoker  . Smokeless tobacco: Never Used  Substance and Sexual Activity  . Alcohol use: No    Alcohol/week: 0.0 standard drinks  . Drug use: No  . Sexual activity: Yes    Birth control/protection: None  Lifestyle  . Physical activity:    Days per week: Not on file    Minutes per session: Not on file  . Stress: Not on file  Relationships  . Social connections:    Talks on phone: Not on file    Gets together: Not on file    Attends religious service: Not on file    Active member of club or organization: Not on file    Attends meetings of clubs or organizations: Not on file    Relationship status: Not on file  Other Topics Concern  . Not on file  Social History Narrative  . Not on file   Allergies  Allergen Reactions  . Nickel Rash   Family History  Problem Relation Age of Onset  . Heart disease Unknown   . Arthritis Unknown   . Breast cancer Maternal Aunt 65     Current Outpatient Medications (Cardiovascular):  .  amLODipine (NORVASC) 10 MG tablet, Take 1 tablet (10 mg total) by mouth daily.  Current Outpatient Medications (Analgesics):  .  celecoxib (CELEBREX) 200 MG capsule, Take 1 capsule (200 mg total) by mouth daily as needed for mild pain. .  Naproxen Sodium (ALEVE PO), Take by mouth.   Current Outpatient Medications (Other):  Marland Kitchen.  TURMERIC PO, Take 900 mg by mouth.    Past medical history, social, surgical and family history all reviewed in electronic medical record.  No pertanent information unless stated regarding to the chief complaint.   Review of Systems:  No headache, visual changes, nausea, vomiting, diarrhea, constipation, dizziness, abdominal pain, skin rash, fevers, chills, night sweats, weight loss, swollen lymph nodes, body aches, joint swelling,  chest pain, shortness of breath, mood changes.   Positive muscle aches   Objective  Blood pressure (!) 146/94, pulse (!) 102, height 5' 2.5" (1.588 m), weight 208 lb (94.3 kg), SpO2 92 %.    General: No apparent distress alert and oriented x3 mood and affect normal, dressed appropriately.  HEENT: Pupils equal, extraocular movements intact  Respiratory: Patient's speak in full sentences and does not appear short of breath  Cardiovascular: No lower extremity edema, non tender, no erythema  Skin: Warm dry intact with no signs of infection or rash on extremities or on axial skeleton.  Abdomen: Soft nontender  Neuro: Cranial nerves II through XII are intact, neurovascularly intact in all extremities with 2+ DTRs and 2+ pulses.  Lymph: No lymphadenopathy of posterior or anterior cervical chain or axillae bilaterally.  Gait antalgic gait MSK:  tender with full range of motion and good stability and symmetric strength and tone of shoulders, elbows, wrist,  and ankles bilaterally.  Arthritic changes of the knees noted Hip: Right hip ROM Inspection reveals pain even at rest patient's hip is externally rotated and sitting.  Patient has no internal range of motion at all. Range of motion causes worsening pain laterally.  Tender to palpation over the greater trochanteric area.  Patient does have 4-5 strength in all planes.  Patient does have mild crepitus noted.  Deep tendon reflexes do appear to be intact   Procedure: Real-time Ultrasound Guided Injection of right greater trochanteric bursitis secondary to patient's body habitus Device: GE Logiq Q7 Ultrasound guided injection is preferred based studies that show increased duration, increased effect, greater accuracy, decreased procedural pain, increased response rate, and decreased cost with ultrasound guided versus blind injection.  Verbal informed consent obtained.  Time-out conducted.  Noted no overlying erythema, induration, or other signs of local infection.  Skin prepped in a sterile fashion.   Local anesthesia: Topical Ethyl chloride.  With sterile technique and under real time ultrasound guidance:  Greater trochanteric area was visualized and patient's bursa was noted. A 22-gauge 3 inch needle was inserted and 4 cc of 0.5% Marcaine and 1 cc of Kenalog 40 mg/dL was injected. Pictures taken Completed without difficulty  Pain immediately resolved suggesting accurate placement of the medication.  Advised to call if fevers/chills, erythema, induration, drainage, or persistent bleeding.  Images permanently stored and available for review in the ultrasound unit.  Impression: Technically successful ultrasound guided injection.  [id number]; 15 additional minutes spent for Therapeutic exercises as stated in above notes.  This included exercises focusing on stretching, strengthening, with significant focus on eccentric aspects.   Long term goals include an improvement in range of motion, strength, endurance as well as avoiding reinjury. Patient's frequency would include in 1-2 times a day, 3-5 times a week for a duration of 6-12 weeks. Hip strengthening exercises which included:  Pelvic tilt/bracing to help with proper recruitment of the lower abs and pelvic floor muscles  Glute strengthening to properly contract glutes without over-engaging low back and hamstrings - prone hip extension and glute bridge exercises Proper stretching techniques to increase effectiveness for the hip flexors, groin, quads, piriformic and low back when appropriate     Proper technique shown and discussed handout in great detail with ATC.  All questions were discussed and answered.     Impression and Recommendations:     This case required medical decision making of moderate complexity. The above documentation has been reviewed and is accurate and complete Judi Saa, DO       Note: This dictation was prepared with Dragon dictation along with smaller phrase technology. Any transcriptional errors that result from  this process are unintentional.

## 2019-01-21 ENCOUNTER — Encounter: Payer: Self-pay | Admitting: Family Medicine

## 2019-01-21 ENCOUNTER — Ambulatory Visit: Payer: Self-pay

## 2019-01-21 ENCOUNTER — Ambulatory Visit (INDEPENDENT_AMBULATORY_CARE_PROVIDER_SITE_OTHER)
Admission: RE | Admit: 2019-01-21 | Discharge: 2019-01-21 | Disposition: A | Discharge status: Home / Self Care | Payer: Medicare Other | Source: Ambulatory Visit | Attending: Family Medicine | Admitting: Family Medicine

## 2019-01-21 ENCOUNTER — Ambulatory Visit (INDEPENDENT_AMBULATORY_CARE_PROVIDER_SITE_OTHER): Payer: Medicare Other | Admitting: Family Medicine

## 2019-01-21 ENCOUNTER — Other Ambulatory Visit: Payer: Self-pay

## 2019-01-21 VITALS — BP 146/94 | HR 102 | Ht 62.5 in | Wt 208.0 lb

## 2019-01-21 DIAGNOSIS — M7061 Trochanteric bursitis, right hip: Secondary | ICD-10-CM | POA: Diagnosis not present

## 2019-01-21 DIAGNOSIS — M25551 Pain in right hip: Secondary | ICD-10-CM | POA: Diagnosis not present

## 2019-01-21 DIAGNOSIS — M1611 Unilateral primary osteoarthritis, right hip: Secondary | ICD-10-CM

## 2019-01-21 NOTE — Assessment & Plan Note (Signed)
Injection given today with patient's pain seem to be more on the lateral aspect of the hip.  We discussed with patient again at great length.  We discussed home exercises, icing regimen, staying well-hydrated.  We discussed in encourage patient to potentially do weight loss which can contribute to some of this discomfort and pain.  X-rays pending.  Home exercises given and work with Event organiser.  Follow-up again in 4 to 6 weeks

## 2019-01-21 NOTE — Patient Instructions (Signed)
Great to see you  Xray downstairs to see what has changed over time INjected the side of the hip  Ice 20 minutes 2 times daily. Usually after activity and before bed. Add tart cherry extract 1200mg  at night to help with the arthritis pain.  May need to consider repacement of the joint but we will make sure you are doing better  See me again in 4-6 weeks Be safe!Marland Kitchen

## 2019-01-21 NOTE — Assessment & Plan Note (Signed)
Right-sided hip arthritis.  Patient has some less than even 0 degrees of internal rotation.  We discussed the possibility of needing surgical intervention in the near future.  We can consider possible intra-articular injection but I think will be short-lived.  We did do a greater trochanteric injection today to help her with some of the nighttime pain.  Discussed over-the-counter medications and encouraged her to get inflammatories.  Discussed icing regimen.  Discussed proper shoes and continue to use pain.  Follow-up again in 4 weeks

## 2019-02-23 NOTE — Progress Notes (Signed)
[facility] [place], [place] [address] Phone: [phone] Subjective:   Fontaine No, am serving as a scribe for Dr. Hulan Saas.   CC: Right hip pain  [id number]   01/21/2019: Injection given today with patient's pain seem to be more on the lateral aspect of the hip.  We discussed with patient again at great length.  We discussed home exercises, icing regimen, staying well-hydrated.  We discussed in encourage patient to potentially do weight loss which can contribute to some of this discomfort and pain.  X-rays pending.  Home exercises given and work with Product/process development scientist.  Follow-up again in 4 to 6 weeks  Right-sided hip arthritis.  Patient has some less than even 0 degrees of internal rotation.  We discussed the possibility of needing surgical intervention in the near future.  We can consider possible intra-articular injection but I think will be short-lived.  We did do a greater trochanteric injection today to help her with some of the nighttime pain.  Discussed over-the-counter medications and encouraged her to get inflammatories.  Discussed icing regimen.  Discussed proper shoes and continue to use pain.  Follow-up again in 4 weeks  Update 02/25/2019: Mary Wolfe is a 69 y.o. female coming in with complaint of right hip pain. Patient states that she is in constant pain. Did not have relief from the injection.    Right hip x-rays taken Jan 21, 2019 showed severe osteoarthritic changes of the right hip. Given a right greater trochanteric injection Jan 28, 2019  Past Medical History:  Diagnosis Date  . Anemia    IDA  . Diverticulitis   . Hyperlipidemia   . Hypertension    Past Surgical History:  Procedure Laterality Date  . CARPAL TUNNEL RELEASE Right   . CESAREAN SECTION    . COLONOSCOPY  03/14/2008  . COLONOSCOPY WITH PROPOFOL N/A 06/25/2018   Procedure: COLONOSCOPY WITH PROPOFOL;  Surgeon: Lollie Sails, MD;  Location:  [facility] ENDOSCOPY;  Service: Endoscopy;  Laterality: N/A;  . ESOPHAGOGASTRODUODENOSCOPY  04/01/1997  . ESOPHAGOGASTRODUODENOSCOPY (EGD) WITH PROPOFOL N/A 06/25/2018   Procedure: ESOPHAGOGASTRODUODENOSCOPY (EGD) WITH PROPOFOL;  Surgeon: Lollie Sails, MD;  Location: [facility] ENDOSCOPY;  Service: Endoscopy;  Laterality: N/A;  . TONSILLECTOMY     Social History   Socioeconomic History  . Marital status: Married    Spouse name: Not on file  . Number of children: Not on file  . Years of education: Not on file  . Highest education level: Not on file  Occupational History  . Not on file  Social Needs  . Financial resource strain: Not on file  . Food insecurity    Worry: Not on file    Inability: Not on file  . Transportation needs    Medical: Not on file    Non-medical: Not on file  Tobacco Use  . Smoking status: Never Smoker  . Smokeless tobacco: Never Used  Substance and Sexual Activity  . Alcohol use: No    Alcohol/week: 0.0 standard drinks  . Drug use: No  . Sexual activity: Yes    Birth control/protection: None  Lifestyle  . Physical activity    Days per week: Not on file    Minutes per session: Not on file  . Stress: Not on file  Relationships  . Social Herbalist on phone: Not on file    Gets together: Not on file    Attends religious service: Not  on file    Active member of club or organization: Not on file    Attends meetings of clubs or organizations: Not on file    Relationship status: Not on file  Other Topics Concern  . Not on file  Social History Narrative  . Not on file   Allergies  Allergen Reactions  . Nickel Rash   Family History  Problem Relation Age of Onset  . Heart disease Unknown   . Arthritis Unknown   . Breast cancer Maternal Aunt 65     Current Outpatient Medications (Cardiovascular):  .  amLODipine (NORVASC) 10 MG tablet, Take 1 tablet (10 mg total) by mouth daily.   Current Outpatient Medications (Analgesics):  .   Naproxen Sodium (ALEVE PO), Take by mouth. .  celecoxib (CELEBREX) 200 MG capsule, Take 1 capsule (200 mg total) by mouth daily as needed for mild pain.   Current Outpatient Medications (Other):  Marland Kitchen.  TURMERIC PO, Take 900 mg by mouth.    Past medical history, social, surgical and family history all reviewed in electronic medical record.  No pertanent information unless stated regarding to the chief complaint.   Review of Systems:  No headache, visual changes, nausea, vomiting, diarrhea, constipation, dizziness, abdominal pain, skin rash, fevers, chills, night sweats, weight loss, swollen lymph nodes, body aches, joint swelling, chest pain, shortness of breath, mood changes.  Positive muscle aches  Objective  Blood pressure 110/72, pulse (!) 105, height 5' 2.5" (1.588 m), SpO2 98 %.    General: No apparent distress alert and oriented x3 mood and affect normal, dressed appropriately.  HEENT: Pupils equal, extraocular movements intact  Respiratory: Patient's speak in full sentences and does not appear short of breath  Cardiovascular: Trace lower extremity edema, non tender, no erythema  Skin: Warm dry intact with no signs of infection or rash on extremities or on axial skeleton.  Abdomen: Soft nontender  Neuro: Cranial nerves II through XII are intact, neurovascularly intact in all extremities with 2+ DTRs and 2+ pulses.  Lymph: No lymphadenopathy of posterior or anterior cervical chain or axillae bilaterally.  Gait severely antalgic MSK:  tender with full range of motion and good stability and symmetric strength and tone of shoulders, elbows, wrist, , knee and ankles bilaterally.  Right hip exam shows the patient has been 0 degrees of internal range of motion.  No crepitus noted with range of motion.  Negative straight leg test.  Neurovascular intact distally.  4-5 strength of the hip flexor compared to contralateral side     Impression and Recommendations:     The above documentation  has been reviewed and is accurate and complete Mary SaaZachary M Hampton [place], DO       Note: This dictation was prepared with Dragon dictation along with smaller phrase technology. Any transcriptional errors that result from this process are unintentional.

## 2019-02-25 ENCOUNTER — Encounter: Payer: Self-pay | Admitting: Family Medicine

## 2019-02-25 ENCOUNTER — Other Ambulatory Visit: Payer: Self-pay

## 2019-02-25 ENCOUNTER — Ambulatory Visit: Payer: Self-pay

## 2019-02-25 ENCOUNTER — Ambulatory Visit (INDEPENDENT_AMBULATORY_CARE_PROVIDER_SITE_OTHER): Payer: Medicare Other | Admitting: Family Medicine

## 2019-02-25 VITALS — BP 110/72 | HR 105 | Ht 62.5 in

## 2019-02-25 DIAGNOSIS — M25551 Pain in right hip: Secondary | ICD-10-CM | POA: Diagnosis not present

## 2019-02-25 DIAGNOSIS — M1611 Unilateral primary osteoarthritis, right hip: Secondary | ICD-10-CM

## 2019-02-25 NOTE — Patient Instructions (Signed)
Will refer to Dr. Ambrose [place] We are here for questions if you need [place]

## 2019-02-25 NOTE — Assessment & Plan Note (Signed)
Discussed with patient home exercise, icing regimen, which activities of doing which once weekly.  Patient has failed all conservative therapy.  Due to the severity of the arthritis only to refer her to orthopedic surgery to discuss surgical replacement.  Patient is in agreement with the plan.  Patient to follow-up with me as needed.

## 2019-03-05 DIAGNOSIS — M1611 Unilateral primary osteoarthritis, right hip: Secondary | ICD-10-CM | POA: Diagnosis not present

## 2019-03-15 ENCOUNTER — Other Ambulatory Visit: Payer: Self-pay | Admitting: Orthopaedic Surgery

## 2019-03-19 DIAGNOSIS — H1045 Other chronic allergic conjunctivitis: Secondary | ICD-10-CM | POA: Diagnosis not present

## 2019-04-02 ENCOUNTER — Telehealth: Payer: Self-pay

## 2019-04-02 NOTE — Telephone Encounter (Signed)
I called patient to schedule follow-up for 4 [facility] care gaps. Patient had cancelled 04/24/19 appointment due to hip replacement surgery. I have scheduled her for 06/19/19 for follow-up.

## 2019-04-05 ENCOUNTER — Other Ambulatory Visit: Payer: Self-pay | Admitting: Orthopaedic Surgery

## 2019-04-05 NOTE — Care Plan (Signed)
Spoke with patient prior to surgery. She will discharge to home with her family and go to [employer] walker ordered for home use. Questions answered. Patient and MD agreeable with plan. Choice offered.   Ladell Heads, [place]

## 2019-04-10 ENCOUNTER — Encounter (HOSPITAL_COMMUNITY): Payer: Self-pay

## 2019-04-10 NOTE — Progress Notes (Signed)
Last office visit note 01/16/2019 by Dr. Caryl Bis in epic.

## 2019-04-10 NOTE — Patient Instructions (Signed)
DUE TO COVID-19 ONLY ONE VISITOR IS ALLOWED TO VISIT DURING VISITOR HOURS ONLY!!   COVID SWAB TESTING MUST BE COMPLETED ON: Friday, Aug. 7, 2020 at    [address], [place] [facility] pre surgical testing line (Must self quarantine after testing. Follow instructions on handout.)             Your procedure is scheduled on: Tuesday, Aug. 11, 2020   [facility]  En[facility]    Report to admitting at 10:15 AM   Call this number if you have problems the morning of surgery [phone]   Do not eat food:After Midnight.   May have liquids until 9:45AM day of surgery   CLEAR LIQUID DIET  Foods Allowed                                                                     Foods Excluded  Water, Black Coffee and tea, regular and decaf                             liquids that you cannot  Plain Jell-O in any flavor  (No red)                                           see through such as: Fruit ices (not with fruit pulp)                                     milk, soups, orange juice  Iced Popsicles (No red)                                    All solid food Carbonated beverages, regular and diet                                    Apple juices Sports drinks like Gatorade (No red) Lightly seasoned clear broth or consume(fat free) Sugar, honey syrup  Sample Menu Breakfast                                Lunch                                     Supper Cranberry juice                    Beef broth                            Chicken broth Jell-O  Grape juice                           Apple juice Coffee or tea                        Jell-O                                      Popsicle                                                Coffee or tea                        Coffee or tea   Complete one Ensure drink the morning of surgery at  9:45AM the day of surgery.   Brush your teeth the morning of surgery.   Do  NOT smoke after Midnight   Take these medicines the morning of surgery with A SIP OF WATER: Amlodipine                               You may not have any metal on your body including hair pins, jewelry, and body piercings             Do not wear make-up, lotions, powders, perfumes/cologne, or deodorant             Do not wear nail polish.  Do not shave  48 hours prior to surgery.                Do not bring valuables to the hospital.  IS NOT             RESPONSIBLE   FOR VALUABLES.   Contacts, dentures or bridgework may not be worn into surgery.   Bring small overnight bag day of surgery.    Special Instructions: Bring a copy of your healthcare power of attorney and living will documents         the day of surgery if you haven't scanned them in before.              Please read over the following fact sheets you were given:  [facility] Preparing [facility] Before surgery, you can play an important role.  Because skin is not sterile, your skin needs to be as free of germs as possible.  You can reduce the number of germs on your skin by washing with CHG (chlorahexidine gluconate) soap before surgery.  CHG is an antiseptic cleaner which kills germs and bonds with the skin to continue killing germs even after washing. Please DO NOT use if you have an allergy to CHG or antibacterial soaps.  If your skin becomes reddened/irritated stop using the CHG and inform your nurse when you arrive at Short Stay. Do not shave (including legs and underarms) for at least 48 hours prior to the first CHG shower.  You may shave your face/neck.  Please follow these instructions carefully:  1.  Shower with CHG Soap the night before surgery and the  morning of surgery.  2.  If you choose to wash  your hair, wash your hair first as usual with your normal  shampoo.  3.  After you shampoo, rinse your hair and body thoroughly to remove the shampoo.                             4.  Use CHG as you would any  other liquid soap.  You can apply chg directly to the skin and wash.  Gently with a scrungie or clean washcloth.  5.  Apply the CHG Soap to your body ONLY FROM THE NECK DOWN.   Do   not use on face/ open                           Wound or open sores. Avoid contact with eyes, ears mouth and   genitals (private parts).                       Wash face,  Genitals (private parts) with your normal soap.             6.  Wash thoroughly, paying special attention to the area where your    surgery  will be performed.  7.  Thoroughly rinse your body with warm water from the neck down.  8.  DO NOT shower/wash with your normal soap after using and rinsing off the CHG Soap.                9.  Pat yourself dry with a clean towel.            10.  Wear clean pajamas.            11.  Place clean sheets on your bed the night of your first shower and do not  sleep with pets. Day of Surgery : Do not apply any lotions/deodorants the morning of surgery.  Please wear clean clothes to the hospital/surgery center.  FAILURE TO FOLLOW THESE INSTRUCTIONS MAY RESULT IN THE CANCELLATION OF YOUR SURGERY  PATIENT SIGNATURE_________________________________  NURSE SIGNATURE__________________________________  ________________________________________________________________________   Mary Wolfe  An incentive spirometer is a tool that can help keep your lungs clear and active. This tool measures how well you are filling your lungs with each breath. Taking long deep breaths may help reverse or decrease the chance of developing breathing (pulmonary) problems (especially infection) following:  A long period of time when you are unable to move or be active. BEFORE THE PROCEDURE   If the spirometer includes an indicator to show your best effort, your nurse or respiratory therapist will set it to a desired goal.  If possible, sit up straight or lean slightly forward. Try not to slouch.  Hold the incentive spirometer  in an upright position. INSTRUCTIONS FOR USE  1. Sit on the edge of your bed if possible, or sit up as far as you can in bed or on a chair. 2. Hold the incentive spirometer in an upright position. 3. Breathe out normally. 4. Place the mouthpiece in your mouth and seal your lips tightly around it. 5. Breathe in slowly and as deeply as possible, raising the piston or the ball toward the top of the column. 6. Hold your breath for 3-5 seconds or for as long as possible. Allow the piston or ball to fall to the bottom of the column. 7. Remove the mouthpiece from your mouth and breathe out normally. 8.  Rest for a few seconds and repeat Steps 1 through 7 at least 10 times every 1-2 hours when you are awake. Take your time and take a few normal breaths between deep breaths. 9. The spirometer may include an indicator to show your best effort. Use the indicator as a goal to work toward during each repetition. 10. After each set of 10 deep breaths, practice coughing to be sure your lungs are clear. If you have an incision (the cut made at the time of surgery), support your incision when coughing by placing a pillow or rolled up towels firmly against it. Once you are able to get out of bed, walk around indoors and cough well. You may stop using the incentive spirometer when instructed by your caregiver.  RISKS AND COMPLICATIONS  Take your time so you do not get dizzy or light-headed.  If you are in pain, you may need to take or ask for pain medication before doing incentive spirometry. It is harder to take a deep breath if you are having pain. AFTER USE  Rest and breathe slowly and easily.  It can be helpful to keep track of a log of your progress. Your caregiver can provide you with a simple table to help with this. If you are using the spirometer at home, follow these instructions: SEEK MEDICAL CARE IF:   You are having difficultly using the spirometer.  You have trouble using the spirometer as  often as instructed.  Your pain medication is not giving enough relief while using the spirometer.  You develop fever of 100.5 F (38.1 C) or higher. SEEK IMMEDIATE MEDICAL CARE IF:   You cough up bloody sputum that had not been present before.  You develop fever of 102 F (38.9 C) or greater.  You develop worsening pain at or near the incision site. MAKE SURE YOU:   Understand these instructions.  Will watch your condition.  Will get help right away if you are not doing well or get worse. Document Released: 01/02/2007 Document Revised: 11/14/2011 Document Reviewed: 03/05/2007 ExitCare Patient Information 2014 [employer], [place].   ________________________________________________________________________  WHAT IS A BLOOD TRANSFUSION? Blood Transfusion Information  A transfusion is the replacement of blood or some of its parts. Blood is made up of multiple cells which provide different functions.  Red blood cells carry oxygen and are used for blood loss replacement.  White blood cells fight against infection.  Platelets control bleeding.  Plasma helps clot blood.  Other blood products are available for specialized needs, such as hemophilia or other clotting disorders. BEFORE THE TRANSFUSION  Who gives blood for transfusions?   Healthy volunteers who are fully evaluated to make sure their blood is safe. This is blood bank blood. Transfusion therapy is the safest it has ever been in the practice of medicine. Before blood is taken from a donor, a complete history is taken to make sure that person has no history of diseases nor engages in risky social behavior (examples are intravenous drug use or sexual activity with multiple partners). The donor's travel history is screened to minimize risk of transmitting infections, such as malaria. The donated blood is tested for signs of infectious diseases, such as HIV and hepatitis. The blood is then tested to be sure it is compatible with  you in order to minimize the chance of a transfusion reaction. If you or a relative donates blood, this is often done in anticipation of surgery and is not appropriate for emergency situations. It takes  many days to process the donated blood. RISKS AND COMPLICATIONS Although transfusion therapy is very safe and saves many lives, the main dangers of transfusion include:   Getting an infectious disease.  Developing a transfusion reaction. This is an allergic reaction to something in the blood you were given. Every precaution is taken to prevent this. The decision to have a blood transfusion has been considered carefully by your caregiver before blood is given. Blood is not given unless the benefits outweigh the risks. AFTER THE TRANSFUSION  Right after receiving a blood transfusion, you will usually feel much better and more energetic. This is especially true if your red blood cells have gotten low (anemic). The transfusion raises the level of the red blood cells which carry oxygen, and this usually causes an energy increase.  The nurse administering the transfusion will monitor you carefully for complications. HOME CARE INSTRUCTIONS  No special instructions are needed after a transfusion. You may find your energy is better. Speak with your caregiver about any limitations on activity for underlying diseases you may have. SEEK MEDICAL CARE IF:   Your condition is not improving after your transfusion.  You develop redness or irritation at the intravenous (IV) site. SEEK IMMEDIATE MEDICAL CARE IF:  Any of the following symptoms occur over the next 12 hours:  Shaking chills.  You have a temperature by mouth above 102 F (38.9 C), not controlled by medicine.  Chest, back, or muscle pain.  People around you feel you are not acting correctly or are confused.  Shortness of breath or difficulty breathing.  Dizziness and fainting.  You get a rash or develop hives.  You have a decrease in  urine output.  Your urine turns a dark color or changes to pink, red, or brown. Any of the following symptoms occur over the next 10 days:  You have a temperature by mouth above 102 F (38.9 C), not controlled by medicine.  Shortness of breath.  Weakness after normal activity.  The white part of the eye turns yellow (jaundice).  You have a decrease in the amount of urine or are urinating less often.  Your urine turns a dark color or changes to pink, red, or brown. Document Released: 08/19/2000 Document Revised: 11/14/2011 Document Reviewed: 04/07/2008 [facility] Patient Information 2014 [place], [place].  _______________________________________________________________________

## 2019-04-11 ENCOUNTER — Encounter (HOSPITAL_COMMUNITY)
Admission: RE | Admit: 2019-04-11 | Discharge: 2019-04-11 | Disposition: A | Discharge status: Home / Self Care | Payer: Medicare Other | Source: Ambulatory Visit | Attending: Orthopaedic Surgery | Admitting: Orthopaedic Surgery

## 2019-04-11 ENCOUNTER — Encounter (HOSPITAL_COMMUNITY): Payer: Self-pay

## 2019-04-11 ENCOUNTER — Ambulatory Visit (HOSPITAL_COMMUNITY)
Admission: RE | Admit: 2019-04-11 | Discharge: 2019-04-11 | Disposition: A | Discharge status: Home / Self Care | Payer: Medicare Other | Source: Ambulatory Visit | Attending: Orthopaedic Surgery | Admitting: Orthopaedic Surgery

## 2019-04-11 ENCOUNTER — Other Ambulatory Visit: Payer: Self-pay

## 2019-04-11 DIAGNOSIS — Z01818 Encounter for other preprocedural examination: Secondary | ICD-10-CM | POA: Diagnosis not present

## 2019-04-11 DIAGNOSIS — Z79899 Other long term (current) drug therapy: Secondary | ICD-10-CM | POA: Diagnosis not present

## 2019-04-11 DIAGNOSIS — M1611 Unilateral primary osteoarthritis, right hip: Secondary | ICD-10-CM | POA: Diagnosis not present

## 2019-04-11 DIAGNOSIS — E785 Hyperlipidemia, unspecified: Secondary | ICD-10-CM | POA: Diagnosis not present

## 2019-04-11 DIAGNOSIS — I10 Essential (primary) hypertension: Secondary | ICD-10-CM | POA: Diagnosis not present

## 2019-04-11 HISTORY — DX: Nausea with vomiting, unspecified: R11.2

## 2019-04-11 HISTORY — DX: Iron deficiency anemia, unspecified: D50.9

## 2019-04-11 HISTORY — DX: Bursopathy, unspecified: M71.9

## 2019-04-11 HISTORY — DX: Obesity, unspecified: E66.9

## 2019-04-11 HISTORY — DX: Unspecified osteoarthritis, unspecified site: M19.90

## 2019-04-11 HISTORY — DX: Other specified postprocedural states: Z98.890

## 2019-04-11 LAB — CBC WITH DIFFERENTIAL/PLATELET
Abs Immature Granulocytes: 0.02 10*3/uL (ref 0.00–0.07)
Basophils Absolute: 0 10*3/uL (ref 0.0–0.1)
Basophils Relative: 0 %
Eosinophils Absolute: 0.1 10*3/uL (ref 0.0–0.5)
Eosinophils Relative: 1 %
HCT: 39.9 % (ref 36.0–46.0)
Hemoglobin: 12.8 g/dL (ref 12.0–15.0)
Immature Granulocytes: 0 %
Lymphocytes Relative: 28 %
Lymphs Abs: 2.4 10*3/uL (ref 0.7–4.0)
MCH: 29.5 pg (ref 26.0–34.0)
MCHC: 32.1 g/dL (ref 30.0–36.0)
MCV: 91.9 fL (ref 80.0–100.0)
Monocytes Absolute: 0.7 10*3/uL (ref 0.1–1.0)
Monocytes Relative: 8 %
Neutro Abs: 5.3 10*3/uL (ref 1.7–7.7)
Neutrophils Relative %: 63 %
Platelets: 217 10*3/uL (ref 150–400)
RBC: 4.34 MIL/uL (ref 3.87–5.11)
RDW: 13.3 % (ref 11.5–15.5)
WBC: 8.4 10*3/uL (ref 4.0–10.5)
nRBC: 0 % (ref 0.0–0.2)

## 2019-04-11 LAB — PROTIME-INR
INR: 1 (ref 0.8–1.2)
Prothrombin Time: 13.5 seconds (ref 11.4–15.2)

## 2019-04-11 LAB — BASIC METABOLIC PANEL
Anion gap: 11 (ref 5–15)
BUN: 14 mg/dL (ref 8–23)
CO2: 27 mmol/L (ref 22–32)
Calcium: 9.9 mg/dL (ref 8.9–10.3)
Chloride: 103 mmol/L (ref 98–111)
Creatinine, Ser: 0.69 mg/dL (ref 0.44–1.00)
GFR calc Af Amer: >60 mL/min (ref >60)
GFR calc non Af Amer: >60 mL/min (ref >60)
Glucose, Bld: 98 mg/dL (ref 70–99)
Potassium: 3.7 mmol/L (ref 3.5–5.1)
Sodium: 141 mmol/L (ref 135–145)

## 2019-04-11 LAB — SURGICAL PCR SCREEN
MRSA, PCR: NEGATIVE
Staphylococcus aureus: NEGATIVE

## 2019-04-11 LAB — URINALYSIS, ROUTINE W REFLEX MICROSCOPIC
Bacteria, UA: NONE SEEN
Bilirubin Urine: NEGATIVE
Glucose, UA: NEGATIVE mg/dL
Ketones, ur: NEGATIVE mg/dL
Nitrite: NEGATIVE
Protein, ur: NEGATIVE mg/dL
Specific Gravity, Urine: 1.015 (ref 1.005–1.030)
pH: 6 (ref 5.0–8.0)

## 2019-04-11 LAB — APTT: aPTT: 34 seconds (ref 24–36)

## 2019-04-11 LAB — ABO/RH: ABO/RH(D): O POS

## 2019-04-12 ENCOUNTER — Other Ambulatory Visit (HOSPITAL_COMMUNITY)
Admission: RE | Admit: 2019-04-12 | Discharge: 2019-04-12 | Disposition: A | Discharge status: Home / Self Care | Payer: Medicare Other | Source: Ambulatory Visit | Attending: Orthopaedic Surgery | Admitting: Orthopaedic Surgery

## 2019-04-12 DIAGNOSIS — Z01812 Encounter for preprocedural laboratory examination: Secondary | ICD-10-CM | POA: Insufficient documentation

## 2019-04-12 DIAGNOSIS — Z20828 Contact with and (suspected) exposure to other viral communicable diseases: Secondary | ICD-10-CM | POA: Insufficient documentation

## 2019-04-12 LAB — SARS CORONAVIRUS 2 (TAT 6-24 HRS): SARS Coronavirus 2: NEGATIVE

## 2019-04-15 MED ORDER — BUPIVACAINE LIPOSOME 1.3 % IJ SUSP
10.0000 mL | Freq: Once | INTRAMUSCULAR | Status: DC
  Filled 2019-04-15: qty 10

## 2019-04-15 MED ORDER — TRANEXAMIC ACID 1000 MG/10ML IV SOLN
2000.0000 mg | INTRAVENOUS | Status: DC
  Filled 2019-04-15: qty 20

## 2019-04-15 NOTE — H&P (Signed)
TOTAL HIP ADMISSION H&P  Patient is admitted for right total hip arthroplasty.  Subjective:  Chief Complaint: right hip pain  HPI: Mary Wolfe, 68 y.o. female, has a history of pain and functional disability in the right hip(s) due to arthritis and patient has failed non-surgical conservative treatments for greater than 12 weeks to include NSAID's and/or analgesics, corticosteriod injections, flexibility and strengthening excercises, use of assistive devices, weight reduction as appropriate and activity modification.  Onset of symptoms was gradual starting 5 years ago with gradually worsening course since that time.The patient noted no past surgery on the right hip(s).  Patient currently rates pain in the right hip at 10 out of 10 with activity. Patient has night pain, worsening of pain with activity and weight bearing, trendelenberg gait, pain that interfers with activities of daily living and crepitus. Patient has evidence of subchondral cysts, subchondral sclerosis, periarticular osteophytes and joint space narrowing by imaging studies. This condition presents safety issues increasing the risk of falls.  There is no current active infection.  Patient Active Problem List   Diagnosis Date Noted  . Greater trochanteric bursitis of right hip 01/21/2019  . Anemia 10/18/2017  . Left ankle swelling 12/21/2016  . Osteoarthritis of right hip 04/01/2016  . Obesity 04/01/2016  . Essential hypertension 01/31/2016  . Hyperlipidemia 01/31/2016  . Back pain 01/31/2016   Past Medical History:  Diagnosis Date  . Bursitis   . Diverticulitis   . Hyperlipidemia   . Hypertension   . Iron deficiency anemia   . OA (osteoarthritis)   . Obesity   . PONV (postoperative nausea and vomiting)     Past Surgical History:  Procedure Laterality Date  . CARPAL TUNNEL RELEASE Right   . CESAREAN SECTION    . COLONOSCOPY  03/14/2008  . COLONOSCOPY WITH PROPOFOL N/A 06/25/2018   Procedure: COLONOSCOPY WITH  PROPOFOL;  Surgeon: Lollie Sails, MD;  Location: [facility] ENDOSCOPY;  Service: Endoscopy;  Laterality: N/A;  . ESOPHAGOGASTRODUODENOSCOPY  04/01/1997  . ESOPHAGOGASTRODUODENOSCOPY (EGD) WITH PROPOFOL N/A 06/25/2018   Procedure: ESOPHAGOGASTRODUODENOSCOPY (EGD) WITH PROPOFOL;  Surgeon: Lollie Sails, MD;  Location: [facility] ENDOSCOPY;  Service: Endoscopy;  Laterality: N/A;  . removed glass from elbow     from accident  . TONSILLECTOMY      Current Facility-Administered Medications  Medication Dose Route Frequency Provider Last Rate Last Dose  . [START ON 04/16/2019] bupivacaine liposome (EXPAREL) 1.3 % injection 133 mg  10 mL Other Once Melrose Nakayama, MD      . Derrill Memo ON 04/16/2019] tranexamic acid (CYKLOKAPRON) 2,000 mg in sodium chloride 0.9 % 50 mL Topical Application  3,235 mg Topical To OR Melrose Nakayama, MD       Current Outpatient Medications  Medication Sig Dispense Refill Last Dose  . amLODipine (NORVASC) 10 MG tablet Take 1 tablet (10 mg total) by mouth daily. 90 tablet 1   . naproxen sodium (ALEVE) 220 MG tablet Take 220 mg by mouth 2 (two) times daily as needed (pain).      . TURMERIC PO Take 900 mg by mouth daily.       Allergies  Allergen Reactions  . Nickel Rash    Social History   Tobacco Use  . Smoking status: Never Smoker  . Smokeless tobacco: Never Used  Substance Use Topics  . Alcohol use: No    Alcohol/week: 0.0 standard drinks    Family History  Problem Relation Age of Onset  . Heart disease Other   . Arthritis  Other   . Breast cancer Maternal Aunt 65     Review of Systems  Musculoskeletal: Positive for joint pain.       Right hip  All other systems reviewed and are negative.   Objective:  Physical Exam  Constitutional: She is oriented to person, place, and time. She appears well-developed and well-nourished.  HENT:  Head: Normocephalic and atraumatic.  Eyes: Pupils are equal, round, and reactive to light.  Neck: Normal range of motion.   Cardiovascular: Normal rate and regular rhythm.  Respiratory: Effort normal.  GI: Soft.  Musculoskeletal:     Comments: Right hip motion is significantly limited.  She has pain on internal rotation.  Straight leg raise is negative.  Opposite hip also has painful motion but it is not nearly as bad.  Sensation and motor function are intact distally.  She has palpable pulses in her feet.    Neurological: She is alert and oriented to person, place, and time.  Skin: Skin is warm and dry.  Psychiatric: She has a normal mood and affect. Her behavior is normal. Judgment and thought content normal.    Vital signs in last 24 hours:    Labs:   Estimated body mass index is 36.11 kg/m as calculated from the following:   Height as of 04/11/19: 5' 2.5" (1.588 m).   Weight as of 04/11/19: 91 kg.   Imaging Review Plain radiographs demonstrate severe degenerative joint disease of the right hip(s). The bone quality appears to be good for age and reported activity level.      Assessment/Plan:  End stage primary  arthritis, right hip(s)  The patient history, physical examination, clinical judgement of the provider and imaging studies are consistent with end stage degenerative joint disease of the right hip(s) and total hip arthroplasty is deemed medically necessary. The treatment options including medical management, injection therapy, arthroscopy and arthroplasty were discussed at length. The risks and benefits of total hip arthroplasty were presented and reviewed. The risks due to aseptic loosening, infection, stiffness, dislocation/subluxation,  thromboembolic complications and other imponderables were discussed.  The patient acknowledged the explanation, agreed to proceed with the plan and consent was signed. Patient is being admitted for inpatient treatment for surgery, pain control, PT, OT, prophylactic antibiotics, VTE prophylaxis, progressive ambulation and ADL's and discharge planning.The patient  is planning to be discharged home with home health services

## 2019-04-16 ENCOUNTER — Observation Stay (HOSPITAL_COMMUNITY)
Admission: RE | Admit: 2019-04-16 | Discharge: 2019-04-17 | Disposition: A | Discharge status: Home / Self Care | Payer: Medicare Other | Attending: Orthopaedic Surgery | Admitting: Orthopaedic Surgery

## 2019-04-16 ENCOUNTER — Ambulatory Visit (HOSPITAL_COMMUNITY): Payer: Medicare Other | Admitting: Certified Registered Nurse Anesthetist

## 2019-04-16 ENCOUNTER — Encounter (HOSPITAL_COMMUNITY)
Admission: RE | Disposition: A | Discharge status: Home / Self Care | Payer: Self-pay | Source: Home / Self Care | Attending: Orthopaedic Surgery

## 2019-04-16 ENCOUNTER — Ambulatory Visit (HOSPITAL_COMMUNITY): Payer: Medicare Other

## 2019-04-16 ENCOUNTER — Other Ambulatory Visit: Payer: Self-pay

## 2019-04-16 ENCOUNTER — Ambulatory Visit (HOSPITAL_COMMUNITY): Payer: Medicare Other | Admitting: Physician Assistant

## 2019-04-16 ENCOUNTER — Encounter (HOSPITAL_COMMUNITY): Payer: Self-pay | Admitting: General Practice

## 2019-04-16 DIAGNOSIS — M7061 Trochanteric bursitis, right hip: Secondary | ICD-10-CM | POA: Insufficient documentation

## 2019-04-16 DIAGNOSIS — E669 Obesity, unspecified: Secondary | ICD-10-CM | POA: Diagnosis not present

## 2019-04-16 DIAGNOSIS — I10 Essential (primary) hypertension: Secondary | ICD-10-CM | POA: Insufficient documentation

## 2019-04-16 DIAGNOSIS — M8568 Other cyst of bone, other site: Secondary | ICD-10-CM | POA: Insufficient documentation

## 2019-04-16 DIAGNOSIS — M25851 Other specified joint disorders, right hip: Secondary | ICD-10-CM | POA: Diagnosis not present

## 2019-04-16 DIAGNOSIS — M25751 Osteophyte, right hip: Secondary | ICD-10-CM | POA: Diagnosis not present

## 2019-04-16 DIAGNOSIS — R269 Unspecified abnormalities of gait and mobility: Secondary | ICD-10-CM | POA: Insufficient documentation

## 2019-04-16 DIAGNOSIS — E785 Hyperlipidemia, unspecified: Secondary | ICD-10-CM | POA: Diagnosis not present

## 2019-04-16 DIAGNOSIS — Z6836 Body mass index (BMI) 36.0-36.9, adult: Secondary | ICD-10-CM | POA: Diagnosis not present

## 2019-04-16 DIAGNOSIS — D649 Anemia, unspecified: Secondary | ICD-10-CM | POA: Diagnosis not present

## 2019-04-16 DIAGNOSIS — M1611 Unilateral primary osteoarthritis, right hip: Secondary | ICD-10-CM | POA: Diagnosis not present

## 2019-04-16 DIAGNOSIS — Z79899 Other long term (current) drug therapy: Secondary | ICD-10-CM | POA: Diagnosis not present

## 2019-04-16 DIAGNOSIS — Z471 Aftercare following joint replacement surgery: Secondary | ICD-10-CM | POA: Diagnosis not present

## 2019-04-16 DIAGNOSIS — Z419 Encounter for procedure for purposes other than remedying health state, unspecified: Secondary | ICD-10-CM

## 2019-04-16 DIAGNOSIS — Z96641 Presence of right artificial hip joint: Secondary | ICD-10-CM | POA: Diagnosis not present

## 2019-04-16 HISTORY — PX: TOTAL HIP ARTHROPLASTY: SHX124

## 2019-04-16 LAB — TYPE AND SCREEN
ABO/RH(D): O POS
Antibody Screen: NEGATIVE

## 2019-04-16 SURGERY — ARTHROPLASTY, HIP, TOTAL, ANTERIOR APPROACH
Anesthesia: Spinal | Site: Hip | Laterality: Right

## 2019-04-16 MED ORDER — TRANEXAMIC ACID 1000 MG/10ML IV SOLN
INTRAVENOUS | Status: DC | PRN
  Administered 2019-04-16: 2000 mg via TOPICAL

## 2019-04-16 MED ORDER — 0.9 % SODIUM CHLORIDE (POUR BTL) OPTIME
TOPICAL | Status: DC | PRN
  Administered 2019-04-16: 1000 mL

## 2019-04-16 MED ORDER — PROPOFOL 500 MG/50ML IV EMUL
INTRAVENOUS | Status: DC | PRN
  Administered 2019-04-16: 75 ug/kg/min via INTRAVENOUS

## 2019-04-16 MED ORDER — ONDANSETRON HCL 4 MG/2ML IJ SOLN: 4.0000 mg | Freq: Four times a day (QID) | INTRAMUSCULAR | Status: DC | PRN

## 2019-04-16 MED ORDER — BISACODYL 5 MG PO TBEC: 5.0000 mg | DELAYED_RELEASE_TABLET | Freq: Every day | ORAL | Status: DC | PRN

## 2019-04-16 MED ORDER — CHLORHEXIDINE GLUCONATE 4 % EX LIQD: 60.0000 mL | Freq: Once | CUTANEOUS | Status: DC

## 2019-04-16 MED ORDER — PHENOL 1.4 % MT LIQD: 1.0000 | OROMUCOSAL | Status: DC | PRN

## 2019-04-16 MED ORDER — METHOCARBAMOL 500 MG PO TABS
ORAL_TABLET | ORAL | Status: AC
  Administered 2019-04-16: 17:00:00 500 mg via ORAL
  Filled 2019-04-16: qty 1

## 2019-04-16 MED ORDER — POVIDONE-IODINE 10 % EX SWAB: 2.0000 "application " | Freq: Once | CUTANEOUS | Status: DC

## 2019-04-16 MED ORDER — PHENYLEPHRINE 40 MCG/ML (10ML) SYRINGE FOR IV PUSH (FOR BLOOD PRESSURE SUPPORT)
PREFILLED_SYRINGE | INTRAVENOUS | Status: DC | PRN
  Administered 2019-04-16 (×2): 80 ug via INTRAVENOUS

## 2019-04-16 MED ORDER — HYDROCODONE-ACETAMINOPHEN 5-325 MG PO TABS
ORAL_TABLET | ORAL | Status: AC
  Filled 2019-04-16: qty 1

## 2019-04-16 MED ORDER — HYDROMORPHONE HCL 1 MG/ML IJ SOLN: 0.2500 mg | INTRAMUSCULAR | Status: DC | PRN

## 2019-04-16 MED ORDER — METHOCARBAMOL 500 MG IVPB - SIMPLE MED
500.0000 mg | Freq: Four times a day (QID) | INTRAVENOUS | Status: DC | PRN
  Filled 2019-04-16: qty 50

## 2019-04-16 MED ORDER — KETOROLAC TROMETHAMINE 15 MG/ML IJ SOLN
7.5000 mg | Freq: Four times a day (QID) | INTRAMUSCULAR | Status: AC
  Administered 2019-04-16 – 2019-04-17 (×4): 7.5 mg via INTRAVENOUS
  Filled 2019-04-16 (×3): qty 1

## 2019-04-16 MED ORDER — AMLODIPINE BESYLATE 10 MG PO TABS
10.0000 mg | ORAL_TABLET | Freq: Every day | ORAL | Status: DC
  Administered 2019-04-17: 10 mg via ORAL
  Filled 2019-04-16: qty 1

## 2019-04-16 MED ORDER — CEFAZOLIN SODIUM-DEXTROSE 2-4 GM/100ML-% IV SOLN
2.0000 g | INTRAVENOUS | Status: AC
  Administered 2019-04-16: 2 g via INTRAVENOUS
  Filled 2019-04-16: qty 100

## 2019-04-16 MED ORDER — DEXAMETHASONE SODIUM PHOSPHATE 10 MG/ML IJ SOLN
INTRAMUSCULAR | Status: DC | PRN
  Administered 2019-04-16: 10 mg via INTRAVENOUS

## 2019-04-16 MED ORDER — TRANEXAMIC ACID-NACL 1000-0.7 MG/100ML-% IV SOLN
1000.0000 mg | Freq: Once | INTRAVENOUS | Status: AC
  Administered 2019-04-16: 1000 mg via INTRAVENOUS
  Filled 2019-04-16: qty 100

## 2019-04-16 MED ORDER — ONDANSETRON HCL 4 MG/2ML IJ SOLN
INTRAMUSCULAR | Status: DC | PRN
  Administered 2019-04-16: 4 mg via INTRAVENOUS

## 2019-04-16 MED ORDER — SODIUM CHLORIDE 0.9 % IV SOLN
INTRAVENOUS | Status: DC | PRN
  Administered 2019-04-16: 50 ug/min via INTRAVENOUS

## 2019-04-16 MED ORDER — LIDOCAINE 2% (20 MG/ML) 5 ML SYRINGE
INTRAMUSCULAR | Status: AC
  Filled 2019-04-16: qty 5

## 2019-04-16 MED ORDER — BUPIVACAINE IN DEXTROSE 0.75-8.25 % IT SOLN
INTRATHECAL | Status: DC | PRN
  Administered 2019-04-16: 1.6 mL via INTRATHECAL

## 2019-04-16 MED ORDER — DOCUSATE SODIUM 100 MG PO CAPS
100.0000 mg | ORAL_CAPSULE | Freq: Two times a day (BID) | ORAL | Status: DC
  Administered 2019-04-16 – 2019-04-17 (×2): 100 mg via ORAL
  Filled 2019-04-16 (×2): qty 1

## 2019-04-16 MED ORDER — ASPIRIN 81 MG PO CHEW
81.0000 mg | CHEWABLE_TABLET | Freq: Two times a day (BID) | ORAL | Status: DC
  Administered 2019-04-16 – 2019-04-17 (×2): 81 mg via ORAL
  Filled 2019-04-16 (×2): qty 1

## 2019-04-16 MED ORDER — PHENYLEPHRINE HCL (PRESSORS) 10 MG/ML IV SOLN
INTRAVENOUS | Status: AC
  Filled 2019-04-16: qty 1

## 2019-04-16 MED ORDER — ONDANSETRON HCL 4 MG PO TABS: 4.0000 mg | ORAL_TABLET | Freq: Four times a day (QID) | ORAL | Status: DC | PRN

## 2019-04-16 MED ORDER — ONDANSETRON HCL 4 MG/2ML IJ SOLN
INTRAMUSCULAR | Status: AC
  Filled 2019-04-16: qty 2

## 2019-04-16 MED ORDER — ACETAMINOPHEN 325 MG PO TABS: 325.0000 mg | ORAL_TABLET | Freq: Four times a day (QID) | ORAL | Status: DC | PRN

## 2019-04-16 MED ORDER — MORPHINE SULFATE (PF) 2 MG/ML IV SOLN
0.5000 mg | INTRAVENOUS | Status: DC | PRN
  Administered 2019-04-16: 1 mg via INTRAVENOUS
  Filled 2019-04-16: qty 1

## 2019-04-16 MED ORDER — METOCLOPRAMIDE HCL 5 MG/ML IJ SOLN: 5.0000 mg | Freq: Three times a day (TID) | INTRAMUSCULAR | Status: DC | PRN

## 2019-04-16 MED ORDER — KETOROLAC TROMETHAMINE 15 MG/ML IJ SOLN
INTRAMUSCULAR | Status: AC
  Administered 2019-04-16: 17:00:00 7.5 mg via INTRAVENOUS
  Filled 2019-04-16: qty 1

## 2019-04-16 MED ORDER — MIDAZOLAM HCL 2 MG/2ML IJ SOLN
INTRAMUSCULAR | Status: AC
  Filled 2019-04-16: qty 2

## 2019-04-16 MED ORDER — LACTATED RINGERS IV SOLN
INTRAVENOUS | Status: DC
  Administered 2019-04-16 (×2): via INTRAVENOUS

## 2019-04-16 MED ORDER — LACTATED RINGERS IV SOLN
INTRAVENOUS | Status: DC
  Administered 2019-04-17: 01:00:00 via INTRAVENOUS

## 2019-04-16 MED ORDER — PROMETHAZINE HCL 25 MG/ML IJ SOLN: 6.2500 mg | INTRAMUSCULAR | Status: DC | PRN

## 2019-04-16 MED ORDER — BUPIVACAINE-EPINEPHRINE (PF) 0.25% -1:200000 IJ SOLN
INTRAMUSCULAR | Status: AC
  Filled 2019-04-16: qty 30

## 2019-04-16 MED ORDER — LIDOCAINE HCL (CARDIAC) PF 100 MG/5ML IV SOSY
PREFILLED_SYRINGE | INTRAVENOUS | Status: DC | PRN
  Administered 2019-04-16: 80 mg via INTRAVENOUS

## 2019-04-16 MED ORDER — DEXAMETHASONE SODIUM PHOSPHATE 10 MG/ML IJ SOLN
INTRAMUSCULAR | Status: AC
  Filled 2019-04-16: qty 1

## 2019-04-16 MED ORDER — DIPHENHYDRAMINE HCL 12.5 MG/5ML PO ELIX: 12.5000 mg | ORAL_SOLUTION | ORAL | Status: DC | PRN

## 2019-04-16 MED ORDER — ALUM & MAG HYDROXIDE-SIMETH 200-200-20 MG/5ML PO SUSP: 30.0000 mL | ORAL | Status: DC | PRN

## 2019-04-16 MED ORDER — BUPIVACAINE LIPOSOME 1.3 % IJ SUSP
INTRAMUSCULAR | Status: DC | PRN
  Administered 2019-04-16: 10 mL

## 2019-04-16 MED ORDER — METOCLOPRAMIDE HCL 5 MG PO TABS: 5.0000 mg | ORAL_TABLET | Freq: Three times a day (TID) | ORAL | Status: DC | PRN

## 2019-04-16 MED ORDER — ACETAMINOPHEN 500 MG PO TABS
500.0000 mg | ORAL_TABLET | Freq: Four times a day (QID) | ORAL | Status: DC
  Administered 2019-04-17 (×3): 500 mg via ORAL
  Filled 2019-04-16 (×3): qty 1

## 2019-04-16 MED ORDER — BUPIVACAINE-EPINEPHRINE (PF) 0.25% -1:200000 IJ SOLN
INTRAMUSCULAR | Status: DC | PRN
  Administered 2019-04-16: 30 mL

## 2019-04-16 MED ORDER — KETOROLAC TROMETHAMINE 30 MG/ML IJ SOLN: 30.0000 mg | Freq: Once | INTRAMUSCULAR | Status: DC | PRN

## 2019-04-16 MED ORDER — MIDAZOLAM HCL 5 MG/5ML IJ SOLN
INTRAMUSCULAR | Status: DC | PRN
  Administered 2019-04-16: 2 mg via INTRAVENOUS

## 2019-04-16 MED ORDER — CEFAZOLIN SODIUM-DEXTROSE 2-4 GM/100ML-% IV SOLN
2.0000 g | Freq: Four times a day (QID) | INTRAVENOUS | Status: AC
  Administered 2019-04-16 – 2019-04-17 (×2): 2 g via INTRAVENOUS
  Filled 2019-04-16 (×2): qty 100

## 2019-04-16 MED ORDER — PROPOFOL 10 MG/ML IV BOLUS
INTRAVENOUS | Status: DC | PRN
  Administered 2019-04-16 (×3): 20 mg via INTRAVENOUS

## 2019-04-16 MED ORDER — MENTHOL 3 MG MT LOZG: 1.0000 | LOZENGE | OROMUCOSAL | Status: DC | PRN

## 2019-04-16 MED ORDER — METHOCARBAMOL 500 MG PO TABS
500.0000 mg | ORAL_TABLET | Freq: Four times a day (QID) | ORAL | Status: DC | PRN
  Administered 2019-04-16: 17:00:00 500 mg via ORAL

## 2019-04-16 MED ORDER — MEPERIDINE HCL 50 MG/ML IJ SOLN: 6.2500 mg | INTRAMUSCULAR | Status: DC | PRN

## 2019-04-16 MED ORDER — HYDROCODONE-ACETAMINOPHEN 5-325 MG PO TABS
1.0000 | ORAL_TABLET | ORAL | Status: DC | PRN
  Administered 2019-04-16: 1 via ORAL
  Administered 2019-04-16: 2 via ORAL
  Administered 2019-04-17: 1 via ORAL
  Filled 2019-04-16: qty 1
  Filled 2019-04-16: qty 2

## 2019-04-16 MED ORDER — TRANEXAMIC ACID-NACL 1000-0.7 MG/100ML-% IV SOLN
1000.0000 mg | INTRAVENOUS | Status: AC
  Administered 2019-04-16: 13:00:00 1000 mg via INTRAVENOUS
  Filled 2019-04-16: qty 100

## 2019-04-16 MED ORDER — HYDROCODONE-ACETAMINOPHEN 7.5-325 MG PO TABS: 1.0000 | ORAL_TABLET | ORAL | Status: DC | PRN

## 2019-04-16 SURGICAL SUPPLY — 41 items
BAG DECANTER FOR FLEXI CONT (MISCELLANEOUS) ×3 IMPLANT
BLADE SAW SGTL 18X1.27X75 (BLADE) ×2 IMPLANT
BLADE SAW SGTL 18X1.27X75MM (BLADE) ×1
CELLS DAT CNTRL 66122 CELL SVR (MISCELLANEOUS) ×1 IMPLANT
COVER PERINEAL POST (MISCELLANEOUS) ×3 IMPLANT
COVER SURGICAL LIGHT HANDLE (MISCELLANEOUS) ×3 IMPLANT
COVER WAND RF STERILE (DRAPES) IMPLANT
DECANTER SPIKE VIAL GLASS SM (MISCELLANEOUS) ×3 IMPLANT
DRAPE IMP U-DRAPE 54X76 (DRAPES) ×3 IMPLANT
DRAPE STERI IOBAN 125X83 (DRAPES) ×3 IMPLANT
DRAPE U-SHAPE 47X51 STRL (DRAPES) ×6 IMPLANT
DRSG AQUACEL AG ADV 3.5X10 (GAUZE/BANDAGES/DRESSINGS) ×3 IMPLANT
DURAPREP 26ML APPLICATOR (WOUND CARE) ×3 IMPLANT
ELECT REM PT RETURN 15FT ADLT (MISCELLANEOUS) ×3 IMPLANT
ELIMINATOR HOLE APEX DEPUY (Hips) ×2 IMPLANT
GLOVE BIO SURGEON STRL SZ8 (GLOVE) ×6 IMPLANT
GLOVE BIOGEL PI IND STRL 8 (GLOVE) ×2 IMPLANT
GLOVE BIOGEL PI INDICATOR 8 (GLOVE) ×4
GOWN STRL REUS W/TWL XL LVL3 (GOWN DISPOSABLE) ×6 IMPLANT
HEAD FEMORAL 32 CERAMIC (Hips) ×2 IMPLANT
HOLDER FOLEY CATH W/STRAP (MISCELLANEOUS) ×3 IMPLANT
KIT TURNOVER KIT A (KITS) IMPLANT
LINER ACET PNNCL PLUS4 NEUTRAL (Hips) IMPLANT
LINER PINN ACET GRIP 50X100 ×2 IMPLANT
MANIFOLD NEPTUNE II (INSTRUMENTS) ×3 IMPLANT
NEEDLE HYPO 22GX1.5 SAFETY (NEEDLE) ×3 IMPLANT
PACK ANTERIOR HIP CUSTOM (KITS) ×3 IMPLANT
PINNACLE PLUS 4 NEUTRAL (Hips) ×3 IMPLANT
RETRACTOR WND ALEXIS 18 MED (MISCELLANEOUS) ×1 IMPLANT
RTRCTR WOUND ALEXIS 18CM MED (MISCELLANEOUS) ×3
STEM FEMORAL SZ 5MM STD ACTIS (Stem) ×2 IMPLANT
SUT ETHIBOND NAB CT1 #1 30IN (SUTURE) ×6 IMPLANT
SUT VIC AB 1 CT1 36 (SUTURE) ×3 IMPLANT
SUT VIC AB 2-0 CT1 27 (SUTURE) ×3
SUT VIC AB 2-0 CT1 TAPERPNT 27 (SUTURE) ×1 IMPLANT
SUT VIC AB 3-0 PS2 18 (SUTURE) ×3
SUT VIC AB 3-0 PS2 18XBRD (SUTURE) ×1 IMPLANT
SUT VLOC 180 0 24IN GS25 (SUTURE) ×3 IMPLANT
SYR 50ML LL SCALE MARK (SYRINGE) ×3 IMPLANT
TRAY FOLEY MTR SLVR 14FR STAT (SET/KITS/TRAYS/PACK) ×2 IMPLANT
YANKAUER SUCT BULB TIP 10FT TU (MISCELLANEOUS) ×3 IMPLANT

## 2019-04-16 NOTE — Op Note (Signed)

## 2019-04-16 NOTE — Anesthesia Procedure Notes (Addendum)
Spinal  Patient location during procedure: OR Start time: 04/16/2019 12:39 PM End time: 04/16/2019 12:42 PM Staffing Anesthesiologist: Lyn Hollingshead, MD Performed: anesthesiologist  Preanesthetic Checklist Completed: patient identified, site marked, surgical consent, pre-op evaluation, timeout performed, IV checked, risks and benefits discussed and monitors and equipment checked Spinal Block Patient position: sitting Prep: site prepped and draped and DuraPrep Patient monitoring: continuous pulse ox and blood pressure Approach: midline Location: L3-4 Injection technique: single-shot Needle Needle type: Pencan  Needle gauge: 24 G Needle length: 10 cm Needle insertion depth: 7 cm Assessment Sensory level: T8

## 2019-04-16 NOTE — Transfer of Care (Signed)
Immediate Anesthesia Transfer of Care Note  Patient: Mary Wolfe  Procedure(s) Performed: Right Anterior Total Hip Arthroplasty (Right Hip)  Patient Location: PACU  Anesthesia Type:Spinal  Level of Consciousness: awake, alert , oriented and patient cooperative  Airway & Oxygen Therapy: Patient Spontanous Breathing and Patient connected to face mask oxygen  Post-op Assessment: Report given to RN, Post -op Vital signs reviewed and stable and Patient moving all extremities  Post vital signs: Reviewed and stable  Last Vitals:  Vitals Value Taken Time  BP 88/65 04/16/19 1445  Temp    Pulse 70 04/16/19 1449  Resp 11 04/16/19 1449  SpO2 100 % 04/16/19 1449  Vitals shown include unvalidated device data.  Last Pain:  Vitals:   04/16/19 1045  TempSrc:   PainSc: 7          Complications: No apparent anesthesia complications

## 2019-04-16 NOTE — Anesthesia Preprocedure Evaluation (Signed)
Anesthesia Evaluation  Patient identified by MRN, date of birth, ID band Patient awake    Reviewed: Allergy & Precautions, NPO status   History of Anesthesia Complications (+) PONV and history of anesthetic complications  Airway Mallampati: II       Dental no notable dental hx. (+) Teeth Intact   Pulmonary neg pulmonary ROS,    Pulmonary exam normal breath sounds clear to auscultation       Cardiovascular hypertension, Pt. on medications Normal cardiovascular exam Rhythm:Regular Rate:Normal     Neuro/Psych negative neurological ROS  negative psych ROS   GI/Hepatic negative GI ROS, Neg liver ROS,   Endo/Other  negative endocrine ROS  Renal/GU negative Renal ROS  negative genitourinary   Musculoskeletal   Abdominal (+) + obese,   Peds  Hematology   Anesthesia Other Findings   Reproductive/Obstetrics                             Anesthesia Physical Anesthesia Plan  ASA: II  Anesthesia Plan: Spinal   Post-op Pain Management:    Induction:   PONV Risk Score and Plan: Ondansetron, Dexamethasone, Midazolam, Propofol infusion and Treatment may vary due to age or medical condition  Airway Management Planned: Natural Airway, Nasal Cannula and Simple Face Mask  Additional Equipment:   Intra-op Plan:   Post-operative Plan:   Informed Consent: I have reviewed the patients History and Physical, chart, labs and discussed the procedure including the risks, benefits and alternatives for the proposed anesthesia with the patient or authorized representative who has indicated his/her understanding and acceptance.       Plan Discussed with: CRNA  Anesthesia Plan Comments:         Anesthesia Quick Evaluation

## 2019-04-16 NOTE — Interval H&P Note (Signed)
History and Physical Interval Note:  04/16/2019 11:37 AM  Mary Wolfe  has presented today for surgery, with the diagnosis of Right Hip Degenerative Joint Diease.  The various methods of treatment have been discussed with the patient and family. After consideration of risks, benefits and other options for treatment, the patient has consented to  Procedure(s): Right Anterior Hip Arthroplasty (Right) as a surgical intervention.  The patient's history has been reviewed, patient examined, no change in status, stable for surgery.  I have reviewed the patient's chart and labs.  Questions were answered to the patient's satisfaction.     Hessie Dibble

## 2019-04-16 NOTE — Care Plan (Signed)
Ortho Bundle Case Management Note  Patient Details  Name: Mary Wolfe MRN: [id number] Date of Birth: December 15, 1950   Spoke with patient prior to surgery. She will discharge to home with her family and go to [employer] walker ordered for home use. Questions answered. Patient and MD agreeable with plan. Choice offered.                  DME Arranged:  [place]ile rolling DME Agency:  [employer]  HH Arranged:    [place] Agency:     Additional Comments: Please contact me with any questions of if this plan should need to change.  Ladell Heads,  [place] [facility]  [phone] 04/16/2019, 10:09 AM

## 2019-04-16 NOTE — Plan of Care (Signed)
Continue POC

## 2019-04-17 ENCOUNTER — Encounter (HOSPITAL_COMMUNITY): Payer: Self-pay | Admitting: Orthopaedic Surgery

## 2019-04-17 DIAGNOSIS — M1611 Unilateral primary osteoarthritis, right hip: Secondary | ICD-10-CM | POA: Diagnosis not present

## 2019-04-17 DIAGNOSIS — M25851 Other specified joint disorders, right hip: Secondary | ICD-10-CM | POA: Diagnosis not present

## 2019-04-17 DIAGNOSIS — R269 Unspecified abnormalities of gait and mobility: Secondary | ICD-10-CM | POA: Diagnosis not present

## 2019-04-17 DIAGNOSIS — M7061 Trochanteric bursitis, right hip: Secondary | ICD-10-CM | POA: Diagnosis not present

## 2019-04-17 DIAGNOSIS — M8568 Other cyst of bone, other site: Secondary | ICD-10-CM | POA: Diagnosis not present

## 2019-04-17 DIAGNOSIS — M25751 Osteophyte, right hip: Secondary | ICD-10-CM | POA: Diagnosis not present

## 2019-04-17 MED ORDER — ASPIRIN 81 MG PO CHEW: 81.0000 mg | CHEWABLE_TABLET | Freq: Two times a day (BID) | ORAL | 0 refills | Status: DC

## 2019-04-17 MED ORDER — HYDROCODONE-ACETAMINOPHEN 5-325 MG PO TABS: 1.0000 | ORAL_TABLET | Freq: Four times a day (QID) | ORAL | 0 refills | Status: DC | PRN

## 2019-04-17 MED ORDER — TIZANIDINE HCL 4 MG PO TABS: 4.0000 mg | ORAL_TABLET | Freq: Four times a day (QID) | ORAL | 1 refills | Status: DC | PRN

## 2019-04-17 NOTE — Progress Notes (Signed)
RW ordered through [employer] and delivered to the patient room  

## 2019-04-17 NOTE — Discharge Summary (Signed)
Patient ID: ERIS HANNAN MRN: [id number] DOB/AGE: 66-Jul-1952 66 y.o.  Admit date: 04/16/2019 Discharge date: 04/17/2019  Admission Diagnoses:  Principal Problem:   Primary osteoarthritis of right hip   Discharge Diagnoses:  Same  Past Medical History:  Diagnosis Date  . Bursitis   . Diverticulitis   . Hyperlipidemia   . Hypertension   . Iron deficiency anemia   . OA (osteoarthritis)   . Obesity   . PONV (postoperative nausea and vomiting)     Surgeries: Procedure(s): Right Anterior Total Hip Arthroplasty on 04/16/2019   Consultants:   Discharged Condition: Improved  Hospital Course: SAINA WAAGE is an 68 y.o. female who was admitted 04/16/2019 for operative treatment ofPrimary osteoarthritis of right hip. Patient has severe unremitting pain that affects sleep, daily activities, and work/hobbies. After pre-op clearance the patient was taken to the operating room on 04/16/2019 and underwent  Procedure(s): Right Anterior Total Hip Arthroplasty.    Patient was given perioperative antibiotics:  Anti-infectives (From admission, onward)   Start     Dose/Rate Route Frequency Ordered Stop   04/16/19 1930  ceFAZolin (ANCEF) IVPB 2g/100 mL premix     2 g 200 mL/hr over 30 Minutes Intravenous Every 6 hours 04/16/19 1829 04/17/19 0136   04/16/19 1045  ceFAZolin (ANCEF) IVPB 2g/100 mL premix     2 g 200 mL/hr over 30 Minutes Intravenous On call to O.R. 04/16/19 1031 04/16/19 1247       Patient was given sequential compression devices, early ambulation, and chemoprophylaxis to prevent DVT.  Patient benefited maximally from hospital stay and there were no complications.    Recent vital signs:  Patient Vitals for the past 24 hrs:  BP Temp Temp src Pulse Resp SpO2 Height Weight  04/17/19 0508 129/83 97.8 F (36.6 C) - 82 16 100 % - -  04/17/19 0112 127/79 97.6 F (36.4 C) - 86 16 100 % - -  04/16/19 2039 (!) 133/97 98.4 F (36.9 C) Oral 97 16 100 % - -  04/16/19 1906 (!) 144/100  98 F (36.7 C) Oral 100 16 100 % - -  04/16/19 1800 135/84 97.7 F (36.5 C) - 98 14 100 % - -  04/16/19 1700 125/78 97.7 F (36.5 C) - 82 15 100 % - -  04/16/19 1615 118/81 - - 78 13 100 % - -  04/16/19 1600 118/78 97.6 F (36.4 C) - 78 13 98 % - -  04/16/19 1545 116/81 - - 69 13 100 % - -  04/16/19 1530 107/71 - - 63 10 99 % - -  04/16/19 1515 104/67 - - 69 12 99 % - -  04/16/19 1500 97/63 - - 63 14 100 % - -  04/16/19 1440 99/65 (!) 97.5 F (36.4 C) - 73 14 100 % - -  04/16/19 1045 - - - - - - 5\' 2"  (1.575 m) 91 kg  04/16/19 1017 (!) 143/92 98.1 F (36.7 C) Oral 99 16 100 % - -     Recent laboratory studies: No results for input(s): WBC, HGB, HCT, PLT, NA, K, CL, CO2, BUN, CREATININE, GLUCOSE, INR, CALCIUM in the last 72 hours.  Invalid input(s): PT, 2   Discharge Medications:   Allergies as of 04/17/2019      Reactions   Nickel Rash      Medication List    STOP taking these medications   Aleve 220 MG tablet Generic drug: naproxen sodium     TAKE these  medications   amLODipine 10 MG tablet Commonly known as: NORVASC Take 1 tablet (10 mg total) by mouth daily.   aspirin 81 MG chewable tablet Chew 1 tablet (81 mg total) by mouth 2 (two) times daily.   HYDROcodone-acetaminophen 5-325 MG tablet Commonly known as: NORCO/VICODIN Take 1-2 tablets by mouth every 6 (six) hours as needed for moderate pain (pain score 4-6).   tiZANidine 4 MG tablet Commonly known as: Zanaflex Take 1 tablet (4 mg total) by mouth every 6 (six) hours as needed.   TURMERIC PO Take 900 mg by mouth daily.            Durable Medical Equipment  (From admission, onward)         Start     Ordered   04/16/19 1830  DME Walker rolling  Once    Question:  Patient needs a walker to treat with the following condition  Answer:  Primary osteoarthritis of right hip   04/16/19 1829   04/16/19 1830  DME 3 n 1  Once     04/16/19 1829   04/16/19 1830  DME Bedside commode  Once    Question:   Patient needs a bedside commode to treat with the following condition  Answer:  Primary osteoarthritis of right hip   04/16/19 1829          Diagnostic Studies: Dg Chest 2 View  Result Date: 04/11/2019 CLINICAL DATA:  Preoperative radiograph for hip replacement EXAM: CHEST - 2 VIEW COMPARISON:  Radiograph 11/13/2009 FINDINGS: No consolidation, features of edema, pneumothorax, or effusion. Pulmonary vascularity is normally distributed. The cardiomediastinal contours are unremarkable. No acute osseous or soft tissue abnormality. Degenerative changes are present in the and imaged spine and shoulders. IMPRESSION: No acute cardiopulmonary abnormality. Electronically Signed   By: Kreg ShropshirePrice  DeHay M.D.   On: 04/11/2019 22:43   Dg C-arm 1-60 Min-no Report  Result Date: 04/16/2019 Fluoroscopy was utilized by the requesting physician.  No radiographic interpretation.   Dg Hip Operative Unilat With Pelvis Right  Result Date: 04/16/2019 CLINICAL DATA:  Right hip replacement EXAM: OPERATIVE RIGHT HIP WITH PELVIS COMPARISON:  None. FLUOROSCOPY TIME:  Radiation Exposure Index (as provided by the fluoroscopic device): 1.92 mGy If the device does not provide the exposure index: Fluoroscopy Time:  11 seconds Number of Acquired Images:  2 FINDINGS: Right hip prosthesis is noted in satisfactory position. No acute bony or soft tissue abnormality is noted. IMPRESSION: Status post right hip replacement Electronically Signed   By: Alcide CleverMark  Lukens M.D.   On: 04/16/2019 15:48    Disposition: Discharge disposition: 01-Home or Self Care       Discharge Instructions    Call MD / Call 911   Complete by: As directed    If you experience chest pain or shortness of breath, CALL 911 and be transported to the hospital emergency room.  If you develope a fever above 101 F, pus (white drainage) or increased drainage or redness at the wound, or calf pain, call your surgeon's office.   Constipation Prevention   Complete by: As  directed    Drink plenty of fluids.  Prune juice may be helpful.  You may use a stool softener, such as Colace (over the counter) 100 mg twice a day.  Use MiraLax (over the counter) for constipation as needed.   Diet - low sodium heart healthy   Complete by: As directed    Discharge instructions   Complete by: As directed  INSTRUCTIONS AFTER JOINT REPLACEMENT   Remove items at home which could result in a fall. This includes throw rugs or furniture in walking pathways ICE to the affected joint every three hours while awake for 30 minutes at a time, for at least the first 3-5 days, and then as needed for pain and swelling.  Continue to use ice for pain and swelling. You may notice swelling that will progress down to the foot and ankle.  This is normal after surgery.  Elevate your leg when you are not up walking on it.   Continue to use the breathing machine you got in the hospital (incentive spirometer) which will help keep your temperature down.  It is common for your temperature to cycle up and down following surgery, especially at night when you are not up moving around and exerting yourself.  The breathing machine keeps your lungs expanded and your temperature down.   DIET:  As you were doing prior to hospitalization, we recommend a well-balanced diet.  DRESSING / WOUND CARE / SHOWERING  You may shower 3 days after surgery, but keep the wounds dry during showering.  You may use an occlusive plastic wrap (Press'n Seal for example), NO SOAKING/SUBMERGING IN THE BATHTUB.  If the bandage gets wet, change with a clean dry gauze.  If the incision gets wet, pat the wound dry with a clean towel.  ACTIVITY  Increase activity slowly as tolerated, but follow the weight bearing instructions below.   No driving for 6 weeks or until further direction given by your physician.  You cannot drive while taking narcotics.  No lifting or carrying greater than 10 lbs. until further directed by your  surgeon. Avoid periods of inactivity such as sitting longer than an hour when not asleep. This helps prevent blood clots.  You may return to work once you are authorized by your doctor.     WEIGHT BEARING   Weight bearing as tolerated with assist device (walker, cane, etc) as directed, use it as long as suggested by your surgeon or therapist, typically at least 4-6 weeks.   EXERCISES  Results after joint replacement surgery are often greatly improved when you follow the exercise, range of motion and muscle strengthening exercises prescribed by your doctor. Safety measures are also important to protect the joint from further injury. Any time any of these exercises cause you to have increased pain or swelling, decrease what you are doing until you are comfortable again and then slowly increase them. If you have problems or questions, call your caregiver or physical therapist for advice.   Rehabilitation is important following a joint replacement. After just a few days of immobilization, the muscles of the leg can become weakened and shrink (atrophy).  These exercises are designed to build up the tone and strength of the thigh and leg muscles and to improve motion. Often times heat used for twenty to thirty minutes before working out will loosen up your tissues and help with improving the range of motion but do not use heat for the first two weeks following surgery (sometimes heat can increase post-operative swelling).   These exercises can be done on a training (exercise) mat, on the floor, on a table or on a bed. Use whatever works the best and is most comfortable for you.    Use music or television while you are exercising so that the exercises are a pleasant break in your day. This will make your life better with the exercises acting  as a break in your routine that you can look forward to.   Perform all exercises about fifteen times, three times per day or as directed.  You should exercise both the  operative leg and the other leg as well.   Exercises include:   Quad Sets - Tighten up the muscle on the front of the thigh (Quad) and hold for 5-10 seconds.   Straight Leg Raises - With your knee straight (if you were given a brace, keep it on), lift the leg to 60 degrees, hold for 3 seconds, and slowly lower the leg.  Perform this exercise against resistance later as your leg gets stronger.  Leg Slides: Lying on your back, slowly slide your foot toward your buttocks, bending your knee up off the floor (only go as far as is comfortable). Then slowly slide your foot back down until your leg is flat on the floor again.  Angel Wings: Lying on your back spread your legs to the side as far apart as you can without causing discomfort.  Hamstring Strength:  Lying on your back, push your heel against the floor with your leg straight by tightening up the muscles of your buttocks.  Repeat, but this time bend your knee to a comfortable angle, and push your heel against the floor.  You may put a pillow under the heel to make it more comfortable if necessary.   A rehabilitation program following joint replacement surgery can speed recovery and prevent re-injury in the future due to weakened muscles. Contact your doctor or a physical therapist for more information on knee rehabilitation.    CONSTIPATION  Constipation is defined medically as fewer than three stools per week and severe constipation as less than one stool per week.  Even if you have a regular bowel pattern at home, your normal regimen is likely to be disrupted due to multiple reasons following surgery.  Combination of anesthesia, postoperative narcotics, change in appetite and fluid intake all can affect your bowels.   YOU MUST use at least one of the following options; they are listed in order of increasing strength to get the job done.  They are all available over the counter, and you may need to use some, POSSIBLY even all of these options:     Drink plenty of fluids (prune juice may be helpful) and high fiber foods Colace 100 mg by mouth twice a day  Senokot for constipation as directed and as needed Dulcolax (bisacodyl), take with full glass of water  Miralax (polyethylene glycol) once or twice a day as needed.  If you have tried all these things and are unable to have a bowel movement in the first 3-4 days after surgery call either your surgeon or your primary doctor.    If you experience loose stools or diarrhea, hold the medications until you stool forms back up.  If your symptoms do not get better within 1 week or if they get worse, check with your doctor.  If you experience "the worst abdominal pain ever" or develop nausea or vomiting, please contact the office immediately for further recommendations for treatment.   ITCHING:  If you experience itching with your medications, try taking only a single pain pill, or even half a pain pill at a time.  You can also use Benadryl over the counter for itching or also to help with sleep.   TED HOSE STOCKINGS:  Use stockings on both legs until for at least 2 weeks or as directed  by physician office. They may be removed at night for sleeping.  MEDICATIONS:  See your medication summary on the "After Visit Summary" that nursing will review with you.  You may have some home medications which will be placed on hold until you complete the course of blood thinner medication.  It is important for you to complete the blood thinner medication as prescribed.  PRECAUTIONS:  If you experience chest pain or shortness of breath - call 911 immediately for transfer to the hospital emergency department.   If you develop a fever greater that 101 F, purulent drainage from wound, increased redness or drainage from wound, foul odor from the wound/dressing, or calf pain - CONTACT YOUR SURGEON.                                                   FOLLOW-UP APPOINTMENTS:  If you do not already have a post-op  appointment, please call the office for an appointment to be seen by your surgeon.  Guidelines for how soon to be seen are listed in your "After Visit Summary", but are typically between 1-4 weeks after surgery.  OTHER INSTRUCTIONS:   Knee Replacement:  Do not place pillow under knee, focus on keeping the knee straight while resting. CPM instructions: 0-90 degrees, 2 hours in the morning, 2 hours in the afternoon, and 2 hours in the evening. Place foam block, curve side up under heel at all times except when in CPM or when walking.  DO NOT modify, tear, cut, or change the foam block in any way.  MAKE SURE YOU:  Understand these instructions.  Get help right away if you are not doing well or get worse.    Thank you for letting us be a part of your medical care team.  It is a privilege we respect greatly.  We hope these instructions will help you stay on track for a fast and full recovery!   Increase activity slowly as tolerated   Complete by: As directed       Follow-up Information    Marcene Corningalldorf, Peter, MD. Go on 04/26/2019.   Specialty: Orthopedic Surgery Why: Your appointment is scheduled for 2:30 Contact information: [address] [place] [address] [phone]        [facility] Go on 04/19/2019.   Why: You are scheduled to start outpatient physical therapy at 10:40. Please arrive at 10:20 to complete your paperwork  Contact information: Physical Therapy [address] [place] [address] [phone]            Signed: Ginger Organndrew Paul Marilynne Dupuis 04/17/2019, 7:57 AM

## 2019-04-17 NOTE — Anesthesia Postprocedure Evaluation (Signed)
Anesthesia Post Note  Patient: Mary Wolfe  Procedure(s) Performed: Right Anterior Total Hip Arthroplasty (Right Hip)     Patient location during evaluation: PACU Anesthesia Type: Spinal Level of consciousness: awake Pain management: pain level controlled Vital Signs Assessment: post-procedure vital signs reviewed and stable Respiratory status: spontaneous breathing Cardiovascular status: stable Postop Assessment: no headache, no backache, spinal receding, patient able to bend at knees and no apparent nausea or vomiting Anesthetic complications: no    Last Vitals:  Vitals:   04/17/19 0508 04/17/19 0846  BP: 129/83 124/77  Pulse: 82 67  Resp: 16 17  Temp: 36.6 C 36.6 C  SpO2: 100% 100%    Last Pain:  Vitals:   04/17/19 0955  TempSrc:   PainSc: 3    Pain Goal: Patients Stated Pain Goal: 3 (04/17/19 0846)                 Huston Foley

## 2019-04-17 NOTE — Progress Notes (Signed)
Subjective: 1 Day Post-Op Procedure(s) (LRB): Right Anterior Total Hip Arthroplasty (Right)   Patient resting well. No real pain. She is hoping to go home today.  Activity level:  wbat Diet tolerance:  ok Voiding:  Foley out this morning. Patient reports pain as mild.    Objective: Vital signs in last 24 hours: Temp:  [97.5 F (36.4 C)-98.4 F (36.9 C)] 97.8 F (36.6 C) (08/12 0508) Pulse Rate:  [63-100] 82 (08/12 0508) Resp:  [10-16] 16 (08/12 0508) BP: (97-144)/(63-100) 129/83 (08/12 0508) SpO2:  [98 %-100 %] 100 % (08/12 0508) Weight:  [91 kg] 91 kg (08/11 1045)  Labs: No results for input(s): HGB in the last 72 hours. No results for input(s): WBC, RBC, HCT, PLT in the last 72 hours. No results for input(s): NA, K, CL, CO2, BUN, CREATININE, GLUCOSE, CALCIUM in the last 72 hours. No results for input(s): LABPT, INR in the last 72 hours.  Physical Exam:  Neurologically intact ABD soft Neurovascular intact Sensation intact distally Intact pulses distally Dorsiflexion/Plantar flexion intact Incision: dressing C/D/I and no drainage No cellulitis present Compartment soft  Assessment/Plan:  1 Day Post-Op Procedure(s) (LRB): Right Anterior Total Hip Arthroplasty (Right) Advance diet Up with therapy D/C IV fluids Discharge home with home health today after PT. Continue on 81mg  BID x 4 weeks for DVT prevention. Follow up in office 2 weeks post op.    Larwance Sachs Mackey Varricchio 04/17/2019, 7:54 AM

## 2019-04-17 NOTE — Evaluation (Signed)
Physical Therapy Evaluation Patient Details Name: Mary Wolfe MRN: [id number] DOB: 08-19-1951 Today's Date: 04/17/2019   History of Present Illness  68 yo female s/p R THA 8/11  Clinical Impression  On eval, pt was Min guard assist for mobility. She walked ~40 feet x 2 with a RW. Mild-Mod pain with activity. Pt did c/o some dizziness during session. BP 123/73 after 1st seated rest break. Pt was then able to walk back to room with no further complaints of dizziness-BP 138/93. Will plan to have a 2nd session prior to d/c home later today.     Follow Up Recommendations Follow surgeon's recommendation for DC plan and follow-up therapies    Equipment Recommendations  Rolling walker with 5" wheels    Recommendations for Other Services       Precautions / Restrictions Precautions Precautions: Fall Restrictions Weight Bearing Restrictions: No Other Position/Activity Restrictions: WBAT      Mobility  Bed Mobility               General bed mobility comments: oob in recliner  Transfers Overall transfer level: Needs assistance   Transfers: Sit to/from Stand Sit to Stand: Supervision         General transfer comment: for safety. VCs hand placement.  Ambulation/Gait Ambulation/Gait assistance: Min guard Gait Distance (Feet): 40 Feet(x2) Assistive device: Rolling walker (2 wheeled) Gait Pattern/deviations: Step-to pattern;Step-through pattern;Decreased stride length     General Gait Details: Close guard for safety. VCs safety, sequence. Slow gait speed.  One seated rest break due to pt c/o dizziness-BP 123/73. Pt was able to walk back to room without any further complaints  Stairs            Wheelchair Mobility    Modified Rankin (Stroke Patients Only)       Balance Overall balance assessment: Needs assistance;History of Falls         Standing balance support: Bilateral upper extremity supported Standing balance-Leahy Scale: Poor                                Pertinent Vitals/Pain Pain Assessment: 0-10 Pain Score: 5  Pain Location: R hip/thigh Pain Descriptors / Indicators: Sore;Tightness;Discomfort Pain Intervention(s): Monitored during session;Ice applied    Home Living Family/patient expects to be discharged to:: Private residence Living Arrangements: Spouse/significant other;Children   Type of Home: House Home Access: Stairs to enter Entrance Stairs-Rails: Left Entrance Stairs-Number of Steps: 3 Home Layout: Two level;Able to live on main level with bedroom/bathroom Home Equipment: Gilford Rile - 2 wheels;Toilet riser;Cane - single point      Prior Function Level of Independence: Independent               Hand Dominance        Extremity/Trunk Assessment   Upper Extremity Assessment Upper Extremity Assessment: Generalized weakness    Lower Extremity Assessment Lower Extremity Assessment: (post op weakness 2* THA)    Cervical / Trunk Assessment Cervical / Trunk Assessment: Normal  Communication   Communication: No difficulties  Cognition Arousal/Alertness: Awake/alert Behavior During Therapy: WFL for tasks assessed/performed Overall Cognitive Status: Within Functional Limits for tasks assessed                                        General Comments      Exercises Total Joint Exercises Ankle Circles/Pumps:  AROM;Both;10 reps;Seated Quad Sets: AROM;Both;10 reps;Seated Heel Slides: AAROM;Right;10 reps;Seated Hip ABduction/ADduction: AAROM;Right;10 reps;Seated Long Arc Quad: AROM;Right;10 reps;Seated   Assessment/Plan    PT Assessment Patient needs continued PT services  PT Problem List Decreased strength;Decreased range of motion;Decreased activity tolerance;Decreased balance;Decreased mobility;Pain;Decreased knowledge of use of DME       PT Treatment Interventions DME instruction;Gait training;Balance training;Stair training;Functional mobility training;Patient/family  education;Therapeutic activities;Therapeutic exercise    PT Goals (Current goals can be found in the Care Plan section)  Acute Rehab PT Goals Patient Stated Goal: regain independence and PLOF. Less pain. PT Goal Formulation: With patient Time For Goal Achievement: 05/01/19 Potential to Achieve Goals: Good    Frequency 7X/week   Barriers to discharge        Co-evaluation               AM-PAC PT "6 Clicks" Mobility  Outcome Measure Help needed turning from your back to your side while in a flat bed without using bedrails?: A Little Help needed moving from lying on your back to sitting on the side of a flat bed without using bedrails?: A Little Help needed moving to and from a bed to a chair (including a wheelchair)?: A Little Help needed standing up from a chair using your arms (e.g., wheelchair or bedside chair)?: A Little Help needed to walk in hospital room?: A Little Help needed climbing 3-5 steps with a railing? : A Little 6 Click Score: 18    End of Session Equipment Utilized During Treatment: Gait belt Activity Tolerance: Patient tolerated treatment well Patient left: in chair;with call bell/phone within reach        Time: [id number] PT Time Calculation (min) (ACUTE ONLY): 29 min   Charges:   PT Evaluation $PT Eval Low Complexity: 1 Low PT Treatments $Gait Training: 8-22 mins          Rebeca AlertJannie Paisley Grajeda, PT Acute Rehabilitation Services Pager: [phone] Office: [phone]

## 2019-04-17 NOTE — Progress Notes (Signed)
Physical Therapy Treatment Patient Details Name: Mary FortDenise C Wolfe MRN: [id number] DOB: 03/02/1951 Today's Date: 04/17/2019    History of Present Illness 68 yo female s/p R THA 8/11    PT Comments    Progressing well with mobility. Reviewed gait training. No complaints of lightheadedness/dizziness this session. Issued HEP in case there is a delay in f/u PT. All education completed.    Follow Up Recommendations  Follow surgeon's recommendation for DC plan and follow-up therapies     Equipment Recommendations  Rolling walker with 5" wheels    Recommendations for Other Services       Precautions / Restrictions Precautions Precautions: Fall Restrictions Weight Bearing Restrictions: No Other Position/Activity Restrictions: WBAT    Mobility  Bed Mobility               General bed mobility comments: oob in recliner  Transfers Overall transfer level: Needs assistance Equipment used: Rolling walker (2 wheeled) Transfers: Sit to/from Stand Sit to Stand: Supervision         General transfer comment: for safety. VCs hand placement.  Ambulation/Gait Ambulation/Gait assistance: Supervision Gait Distance (Feet): 100 Feet Assistive device: Rolling walker (2 wheeled) Gait Pattern/deviations: Step-through pattern;Decreased stride length     General Gait Details: for safety. slow gait speed. no complaints of dizziness this time.   Stairs             Wheelchair Mobility    Modified Rankin (Stroke Patients Only)       Balance Overall balance assessment: History of Falls;Needs assistance         Standing balance support: Bilateral upper extremity supported Standing balance-Leahy Scale: Poor                              Cognition Arousal/Alertness: Awake/alert Behavior During Therapy: WFL for tasks assessed/performed Overall Cognitive Status: Within Functional Limits for tasks assessed                                         Exercises Total Joint Exercises Ankle Circles/Pumps: AROM;Both;10 reps;Seated Quad Sets: AROM;Both;10 reps;Seated Heel Slides: AAROM;Right;10 reps;Seated Hip ABduction/ADduction: AAROM;Right;10 reps;Seated Long Arc Quad: AROM;Right;10 reps;Seated    General Comments        Pertinent Vitals/Pain Pain Assessment: 0-10 Pain Score: 5  Pain Location: R hip/thigh Pain Descriptors / Indicators: Sore;Tightness;Discomfort Pain Intervention(s): Monitored during session    Home Living                      Prior Function            PT Goals (current goals can now be found in the care plan section) Acute Rehab PT Goals Patient Stated Goal: regain independence and PLOF. Less pain. PT Goal Formulation: With patient Time For Goal Achievement: 05/01/19 Potential to Achieve Goals: Good Progress towards PT goals: Progressing toward goals    Frequency    7X/week      PT Plan Current plan remains appropriate    Co-evaluation              AM-PAC PT "6 Clicks" Mobility   Outcome Measure  Help needed turning from your back to your side while in a flat bed without using bedrails?: A Little Help needed moving from lying on your back to sitting on the side of a flat  bed without using bedrails?: A Little Help needed moving to and from a bed to a chair (including a wheelchair)?: A Little Help needed standing up from a chair using your arms (e.g., wheelchair or bedside chair)?: A Little Help needed to walk in hospital room?: A Little Help needed climbing 3-5 steps with a railing? : A Little 6 Click Score: 18    End of Session Equipment Utilized During Treatment: Gait belt Activity Tolerance: Patient tolerated treatment well Patient left: in chair;with call bell/phone within reach   PT Visit Diagnosis: Other abnormalities of gait and mobility (R26.89)     Time: 0211-1552 PT Time Calculation (min) (ACUTE ONLY): 11 min  Charges:  $Gait Training: 8-22 mins                        Weston Anna, [place] Pager: [phone] Office: [phone]

## 2019-04-19 DIAGNOSIS — M25651 Stiffness of right hip, not elsewhere classified: Secondary | ICD-10-CM | POA: Diagnosis not present

## 2019-04-19 DIAGNOSIS — M6281 Muscle weakness (generalized): Secondary | ICD-10-CM | POA: Diagnosis not present

## 2019-04-19 DIAGNOSIS — Z96641 Presence of right artificial hip joint: Secondary | ICD-10-CM | POA: Diagnosis not present

## 2019-04-23 DIAGNOSIS — M25651 Stiffness of right hip, not elsewhere classified: Secondary | ICD-10-CM | POA: Diagnosis not present

## 2019-04-23 DIAGNOSIS — Z96641 Presence of right artificial hip joint: Secondary | ICD-10-CM | POA: Diagnosis not present

## 2019-04-23 DIAGNOSIS — M6281 Muscle weakness (generalized): Secondary | ICD-10-CM | POA: Diagnosis not present

## 2019-04-24 ENCOUNTER — Ambulatory Visit: Payer: Medicare Other | Admitting: Family Medicine

## 2019-04-26 DIAGNOSIS — M25561 Pain in right knee: Secondary | ICD-10-CM | POA: Diagnosis not present

## 2019-04-26 DIAGNOSIS — M25651 Stiffness of right hip, not elsewhere classified: Secondary | ICD-10-CM | POA: Diagnosis not present

## 2019-04-26 DIAGNOSIS — M6281 Muscle weakness (generalized): Secondary | ICD-10-CM | POA: Diagnosis not present

## 2019-04-26 DIAGNOSIS — Z96641 Presence of right artificial hip joint: Secondary | ICD-10-CM | POA: Diagnosis not present

## 2019-04-29 DIAGNOSIS — M6281 Muscle weakness (generalized): Secondary | ICD-10-CM | POA: Diagnosis not present

## 2019-04-29 DIAGNOSIS — Z96641 Presence of right artificial hip joint: Secondary | ICD-10-CM | POA: Diagnosis not present

## 2019-04-29 DIAGNOSIS — M25651 Stiffness of right hip, not elsewhere classified: Secondary | ICD-10-CM | POA: Diagnosis not present

## 2019-05-01 DIAGNOSIS — M6281 Muscle weakness (generalized): Secondary | ICD-10-CM | POA: Diagnosis not present

## 2019-05-01 DIAGNOSIS — M25651 Stiffness of right hip, not elsewhere classified: Secondary | ICD-10-CM | POA: Diagnosis not present

## 2019-05-01 DIAGNOSIS — Z96641 Presence of right artificial hip joint: Secondary | ICD-10-CM | POA: Diagnosis not present

## 2019-05-08 DIAGNOSIS — M25651 Stiffness of right hip, not elsewhere classified: Secondary | ICD-10-CM | POA: Diagnosis not present

## 2019-05-08 DIAGNOSIS — M6281 Muscle weakness (generalized): Secondary | ICD-10-CM | POA: Diagnosis not present

## 2019-05-08 DIAGNOSIS — Z96641 Presence of right artificial hip joint: Secondary | ICD-10-CM | POA: Diagnosis not present

## 2019-05-10 DIAGNOSIS — M6281 Muscle weakness (generalized): Secondary | ICD-10-CM | POA: Diagnosis not present

## 2019-05-10 DIAGNOSIS — Z96641 Presence of right artificial hip joint: Secondary | ICD-10-CM | POA: Diagnosis not present

## 2019-05-10 DIAGNOSIS — M25651 Stiffness of right hip, not elsewhere classified: Secondary | ICD-10-CM | POA: Diagnosis not present

## 2019-05-14 DIAGNOSIS — Z96641 Presence of right artificial hip joint: Secondary | ICD-10-CM | POA: Diagnosis not present

## 2019-05-14 DIAGNOSIS — M25651 Stiffness of right hip, not elsewhere classified: Secondary | ICD-10-CM | POA: Diagnosis not present

## 2019-05-14 DIAGNOSIS — M6281 Muscle weakness (generalized): Secondary | ICD-10-CM | POA: Diagnosis not present

## 2019-05-17 DIAGNOSIS — M25651 Stiffness of right hip, not elsewhere classified: Secondary | ICD-10-CM | POA: Diagnosis not present

## 2019-05-17 DIAGNOSIS — Z96641 Presence of right artificial hip joint: Secondary | ICD-10-CM | POA: Diagnosis not present

## 2019-05-17 DIAGNOSIS — M6281 Muscle weakness (generalized): Secondary | ICD-10-CM | POA: Diagnosis not present

## 2019-05-20 DIAGNOSIS — Z96641 Presence of right artificial hip joint: Secondary | ICD-10-CM | POA: Diagnosis not present

## 2019-05-20 DIAGNOSIS — M25651 Stiffness of right hip, not elsewhere classified: Secondary | ICD-10-CM | POA: Diagnosis not present

## 2019-05-20 DIAGNOSIS — M6281 Muscle weakness (generalized): Secondary | ICD-10-CM | POA: Diagnosis not present

## 2019-05-28 DIAGNOSIS — M6281 Muscle weakness (generalized): Secondary | ICD-10-CM | POA: Diagnosis not present

## 2019-05-28 DIAGNOSIS — Z96641 Presence of right artificial hip joint: Secondary | ICD-10-CM | POA: Diagnosis not present

## 2019-05-28 DIAGNOSIS — M25651 Stiffness of right hip, not elsewhere classified: Secondary | ICD-10-CM | POA: Diagnosis not present

## 2019-05-31 DIAGNOSIS — Z96641 Presence of right artificial hip joint: Secondary | ICD-10-CM | POA: Diagnosis not present

## 2019-05-31 DIAGNOSIS — M25651 Stiffness of right hip, not elsewhere classified: Secondary | ICD-10-CM | POA: Diagnosis not present

## 2019-05-31 DIAGNOSIS — M6281 Muscle weakness (generalized): Secondary | ICD-10-CM | POA: Diagnosis not present

## 2019-06-03 DIAGNOSIS — M25651 Stiffness of right hip, not elsewhere classified: Secondary | ICD-10-CM | POA: Diagnosis not present

## 2019-06-03 DIAGNOSIS — Z96641 Presence of right artificial hip joint: Secondary | ICD-10-CM | POA: Diagnosis not present

## 2019-06-03 DIAGNOSIS — M6281 Muscle weakness (generalized): Secondary | ICD-10-CM | POA: Diagnosis not present

## 2019-06-05 DIAGNOSIS — Z96641 Presence of right artificial hip joint: Secondary | ICD-10-CM | POA: Diagnosis not present

## 2019-06-05 DIAGNOSIS — M6281 Muscle weakness (generalized): Secondary | ICD-10-CM | POA: Diagnosis not present

## 2019-06-05 DIAGNOSIS — M25651 Stiffness of right hip, not elsewhere classified: Secondary | ICD-10-CM | POA: Diagnosis not present

## 2019-06-10 DIAGNOSIS — Z96641 Presence of right artificial hip joint: Secondary | ICD-10-CM | POA: Diagnosis not present

## 2019-06-10 DIAGNOSIS — M6281 Muscle weakness (generalized): Secondary | ICD-10-CM | POA: Diagnosis not present

## 2019-06-10 DIAGNOSIS — M25651 Stiffness of right hip, not elsewhere classified: Secondary | ICD-10-CM | POA: Diagnosis not present

## 2019-06-14 DIAGNOSIS — M25651 Stiffness of right hip, not elsewhere classified: Secondary | ICD-10-CM | POA: Diagnosis not present

## 2019-06-14 DIAGNOSIS — Z96641 Presence of right artificial hip joint: Secondary | ICD-10-CM | POA: Diagnosis not present

## 2019-06-14 DIAGNOSIS — M6281 Muscle weakness (generalized): Secondary | ICD-10-CM | POA: Diagnosis not present

## 2019-06-17 DIAGNOSIS — Z96641 Presence of right artificial hip joint: Secondary | ICD-10-CM | POA: Diagnosis not present

## 2019-06-17 DIAGNOSIS — M25651 Stiffness of right hip, not elsewhere classified: Secondary | ICD-10-CM | POA: Diagnosis not present

## 2019-06-17 DIAGNOSIS — M6281 Muscle weakness (generalized): Secondary | ICD-10-CM | POA: Diagnosis not present

## 2019-06-19 ENCOUNTER — Ambulatory Visit: Payer: TRICARE For Life (TFL) | Admitting: Family Medicine

## 2019-06-21 DIAGNOSIS — M6281 Muscle weakness (generalized): Secondary | ICD-10-CM | POA: Diagnosis not present

## 2019-06-21 DIAGNOSIS — M25651 Stiffness of right hip, not elsewhere classified: Secondary | ICD-10-CM | POA: Diagnosis not present

## 2019-06-21 DIAGNOSIS — Z96641 Presence of right artificial hip joint: Secondary | ICD-10-CM | POA: Diagnosis not present

## 2019-06-24 ENCOUNTER — Other Ambulatory Visit: Payer: Self-pay

## 2019-06-26 ENCOUNTER — Other Ambulatory Visit: Payer: Self-pay

## 2019-06-26 ENCOUNTER — Ambulatory Visit (INDEPENDENT_AMBULATORY_CARE_PROVIDER_SITE_OTHER): Payer: Medicare Other | Admitting: Family Medicine

## 2019-06-26 ENCOUNTER — Encounter: Payer: Self-pay | Admitting: Family Medicine

## 2019-06-26 VITALS — BP 130/80 | HR 103 | Temp 97.2°F | Ht 62.5 in | Wt 204.6 lb

## 2019-06-26 DIAGNOSIS — Z96641 Presence of right artificial hip joint: Secondary | ICD-10-CM | POA: Diagnosis not present

## 2019-06-26 DIAGNOSIS — R7303 Prediabetes: Secondary | ICD-10-CM | POA: Diagnosis not present

## 2019-06-26 DIAGNOSIS — I10 Essential (primary) hypertension: Secondary | ICD-10-CM | POA: Diagnosis not present

## 2019-06-26 DIAGNOSIS — E785 Hyperlipidemia, unspecified: Secondary | ICD-10-CM

## 2019-06-26 LAB — HEPATIC FUNCTION PANEL
ALT: 11 U/L (ref 0–35)
AST: 15 U/L (ref 0–37)
Albumin: 4.5 g/dL (ref 3.5–5.2)
Alkaline Phosphatase: 91 U/L (ref 39–117)
Bilirubin, Direct: 0.1 mg/dL (ref 0.0–0.3)
Total Bilirubin: 0.5 mg/dL (ref 0.2–1.2)
Total Protein: 7.3 g/dL (ref 6.0–8.3)

## 2019-06-26 LAB — HEMOGLOBIN A1C: Hgb A1c MFr Bld: 5.4 % (ref 4.6–6.5)

## 2019-06-26 LAB — LIPID PANEL
Cholesterol: 289 mg/dL — ABNORMAL HIGH (ref 0–200)
HDL: 54.9 mg/dL (ref >39.00)
LDL Cholesterol: 208 mg/dL — ABNORMAL HIGH (ref 0–99)
NonHDL: 233.87
Total CHOL/HDL Ratio: 5
Triglycerides: 129 mg/dL (ref 0.0–149.0)
VLDL: 25.8 mg/dL (ref 0.0–40.0)

## 2019-06-26 NOTE — Progress Notes (Signed)
  Tommi Rumps, MD Phone: [phone]  Mary Wolfe is a 68 y.o. female who presents today for follow-up.  Status post right hip replacement: Patient underwent this in August.  She is doing quite well.  She has no pain now.  She has healed up well and continues to follow with orthopedics.  She is doing physical therapy work at home as they have released her from PT.  Hypertension: Typically 110-125/80s.  Taking amlodipine.  No chest pain, shortness of breath, or edema.  Prediabetes: Is doing physical therapy exercises at home and notes she is going to start walking once her orthopedist releases her to do that.  She eats a fairly regular diet.  Occasional junk food and sweets is nothing excessive.  No soda or sweet tea.  Social History   Tobacco Use  Smoking Status Never Smoker  Smokeless Tobacco Never Used     ROS see history of present illness  Objective  Physical Exam Vitals:   06/26/19 1031  BP: 130/80  Pulse: (!) 103  Temp: (!) 97.2 F (36.2 C)  SpO2: 99%    BP Readings from Last 3 Encounters:  06/26/19 130/80  04/17/19 124/77  04/11/19 (!) 144/98   Wt Readings from Last 3 Encounters:  06/26/19 204 lb 9.6 oz (92.8 kg)  04/16/19 200 lb 9.6 oz (91 kg)  04/11/19 200 lb 9.6 oz (91 kg)    Physical Exam Constitutional:      General: She is not in acute distress.    Appearance: She is not diaphoretic.  Cardiovascular:     Rate and Rhythm: Normal rate and regular rhythm.     Heart sounds: Normal heart sounds.  Pulmonary:     Effort: Pulmonary effort is normal.     Breath sounds: Normal breath sounds.  Musculoskeletal:     Comments: Good internal and external range of motion in bilateral hips, no tenderness of the right lateral hip  Skin:    General: Skin is warm and dry.  Neurological:     Mental Status: She is alert.      Assessment/Plan: Please see individual problem list.  Essential hypertension Adequately controlled.  Continue current regimen.   Hyperlipidemia Check lipid panel.  Prediabetes Check A1c.  Encouraged healthy diet.  Encouraged starting to walk once orthopedics allows her to do.  Status post right hip replacement Patient is doing quite well.  She will continue to follow with her orthopedist.   Health Maintenance: Patient declines flu vaccine and pneumonia vaccine.  I discussed the reasoning behind these though she still declined.  She declines DEXA scan until next year.  She declines doing a mammogram this year.  I discussed that it has been almost 3 years since her last one and there is the potential that we could be missing a breast cancer that could worsen or spread prior to her next mammogram.  I encouraged her to have her mammogram done though she has deferred this with knowing the risks until COVID-19 is improved.  Orders Placed This Encounter  Procedures  . Lipid panel  . Hepatic function panel  . HgB A1c    No orders of the defined types were placed in this encounter.    Tommi Rumps, MD [place]

## 2019-06-26 NOTE — Assessment & Plan Note (Signed)
Check lipid panel  

## 2019-06-26 NOTE — Patient Instructions (Signed)
Nice to see you. Please finish your follow-up with the orthopedist. Once they released you please start to walk for exercise. Please continue to monitor your blood pressure.

## 2019-06-26 NOTE — Assessment & Plan Note (Signed)
Check A1c.  Encouraged healthy diet.  Encouraged starting to walk once orthopedics allows her to do.

## 2019-06-26 NOTE — Assessment & Plan Note (Signed)
Patient is doing quite well.  She will continue to follow with her orthopedist.

## 2019-06-26 NOTE — Assessment & Plan Note (Signed)
Adequately controlled.  Continue current regimen. 

## 2019-07-01 ENCOUNTER — Telehealth: Payer: Self-pay

## 2019-07-01 MED ORDER — ROSUVASTATIN CALCIUM 40 MG PO TABS: 40.0000 mg | ORAL_TABLET | Freq: Every day | ORAL | 1 refills | Status: DC

## 2019-07-01 NOTE — Telephone Encounter (Signed)
I called and spoke with the patient and informed her of her lab results, pt understood and she is willing to start on the Crestor 40 mg tablets and I sent them to her pharmacy 90 tabs with 1 refill per the provider.  Patient also schedule her lab appt in 6 weeks.  Bryanne Riquelme,cma

## 2019-07-01 NOTE — Telephone Encounter (Signed)
-----   Message from Leone Haven, MD sent at 06/29/2019  2:14 PM EDT ----- Please call the patient.  Her cholesterol is very uncontrolled.  Her LDL is 208.  She needs to be on medication for this to help reduce her risk of stroke and heart attack.  I would like to start her on Crestor 40 mg once daily by mouth.  If she is okay starting on this you can send it to her pharmacy with 90 tablets and 1 refill.  She would need a repeat direct LDL completed in 6 weeks with a hepatic function panel for a diagnosis of hyperlipidemia.  Her other lab work is acceptable.  Thanks.

## 2019-07-16 ENCOUNTER — Ambulatory Visit: Payer: Self-pay | Admitting: *Deleted

## 2019-07-16 NOTE — Telephone Encounter (Addendum)
Patient was offered an appt today w/ Philis Nettle, FNP.  Pt said she did not have transportation to get to an appt today.  Pt requested an appt tomorrow when her husband can bring her.  Pt scheduled an in-person appt w/ Philis Nettle, FNP for tomorrow at 9:00 am.  Pt screened for COVID.  No symptoms/no travel/no exposure.  Pt denies having any SOB, dizziness, lightheadedness.  Advised pt to go to urgent care or ED if swelling worsens before appt.  Pt voiced understanding.

## 2019-07-16 NOTE — Telephone Encounter (Signed)
Bilateral ankle swelling should be fine to wait until tomorrow.  It would be unlikely to be related to the Crestor.

## 2019-07-16 NOTE — Telephone Encounter (Signed)
Contacted pt due to concerns about swelling ankles after she started taking Crestor 07/03/2019; she wants the dr to know this medication is making her ankles swell; she also wants to know if she should stop taking? She put on her compression stockings which helped a little; initially her ankles were moderately swollen, but now they are mild since wearing the compression hose; she has not checked her BP but she continues taking her BP medication; the swelling has gone down some but the left is more swollen than the right; her swelling worsens during the day; the pt denies chest pain, SOB, and says that any discomfort she has is related to her previous hip surgery; recommendations made per nurse triage protocol; she verbalized understanding; the pt sees Dr Caryl Bis, [facility]; pt transferred to [facility] for scheduling.   Reason for Disposition . [1] MILD swelling of both ankles (i.e., pedal edema) AND [2] new onset or worsening  Answer Assessment - Initial Assessment Questions 1. ONSET: "When did the swelling start?" (e.g., minutes, hours, days)     days 2. LOCATION: "What part of the leg is swollen?"  "Are both legs swollen or just one leg?"     bil ankles L>R 3. SEVERITY: "How bad is the swelling?" (e.g., localized; mild, moderate, severe)  - Localized - small area of swelling localized to one leg  - MILD pedal edema - swelling limited to foot and ankle, pitting edema < 1/4 inch (6 mm) deep, rest and elevation eliminate most or all swelling  - MODERATE edema - swelling of lower leg to knee, pitting edema > 1/4 inch (6 mm) deep, rest and elevation only partially reduce swelling  - SEVERE edema - swelling extends above knee, facial or hand swelling present     Initially moderate but now mild after wearing compression hose 4. REDNESS: "Does the swelling look red or infected?"  no 5. PAIN: "Is the swelling painful to touch?" If so, ask: "How painful is it?"   (Scale 1-10; mild, moderate or severe)    no 6. FEVER: "Do you have a fever?" If so, ask: "What is it, how was it measured, and when did it start?"      no 7. CAUSE: "What do you think is causing the leg swelling?"    Crestor 8. MEDICAL HISTORY: "Do you have a history of heart failure, kidney disease, liver failure, or cancer?"   HTN 9. RECURRENT SYMPTOM: "Have you had leg swelling before?" If so, ask: "When was the last time?" "What happened that time?"   no 10. OTHER SYMPTOMS: "Do you have any other symptoms?" (e.g., chest pain, difficulty breathing)     no 11. PREGNANCY: "Is there any chance you are pregnant?" "When was your last menstrual period?"       no  Answer Assessment - Initial Assessment Questions n/a  Protocols used: LEG SWELLING AND EDEMA-A-AH, ANKLE SWELLING-A-AH

## 2019-07-17 ENCOUNTER — Ambulatory Visit (INDEPENDENT_AMBULATORY_CARE_PROVIDER_SITE_OTHER): Payer: Medicare Other | Admitting: Family Medicine

## 2019-07-17 ENCOUNTER — Other Ambulatory Visit: Payer: Self-pay

## 2019-07-17 ENCOUNTER — Encounter: Payer: Self-pay | Admitting: Family Medicine

## 2019-07-17 VITALS — BP 128/84 | HR 94 | Temp 96.2°F | Wt 203.6 lb

## 2019-07-17 DIAGNOSIS — M7989 Other specified soft tissue disorders: Secondary | ICD-10-CM

## 2019-07-17 DIAGNOSIS — I10 Essential (primary) hypertension: Secondary | ICD-10-CM | POA: Diagnosis not present

## 2019-07-17 DIAGNOSIS — E785 Hyperlipidemia, unspecified: Secondary | ICD-10-CM

## 2019-07-17 NOTE — Progress Notes (Signed)
Subjective:    Patient ID: Mary Wolfe, female    DOB: 1950-10-04, 68 y.o.   MRN: [id number]  HPI   Patient presents to clinic complaining of bilateral ankle swelling.  States it will get worse throughout the day and then upon waking in the morning the swelling has greatly improved.  Denies chest pain, palpitations, shortness breath or wheezing.  She has been on amlodipine for BP control for years.  Patient states she believes the swelling appeared/got worse around the time she started taking Crestor.  Patient Active Problem List   Diagnosis Date Noted  . Status post right hip replacement 06/26/2019  . Prediabetes 06/26/2019  . Greater trochanteric bursitis of right hip 01/21/2019  . Anemia 10/18/2017  . Leg swelling 12/21/2016  . Primary osteoarthritis of right hip 04/01/2016  . Obesity 04/01/2016  . Essential hypertension 01/31/2016  . Hyperlipidemia 01/31/2016  . Back pain 01/31/2016   Social History   Tobacco Use  . Smoking status: Never Smoker  . Smokeless tobacco: Never Used  Substance Use Topics  . Alcohol use: No    Alcohol/week: 0.0 standard drinks   Review of Systems  Constitutional: Negative for chills, fatigue and fever.  HENT: Negative for congestion, ear pain, sinus pain and sore throat.   Eyes: Negative.   Respiratory: Negative for cough, shortness of breath and wheezing.   Cardiovascular: Negative for chest pain, palpitations. +ankle swelling  Gastrointestinal: Negative for abdominal pain, diarrhea, nausea and vomiting.  Genitourinary: Negative for dysuria, frequency and urgency.  Musculoskeletal: Negative for arthralgias and myalgias.  Skin: Negative for color change, pallor and rash.  Neurological: Negative for syncope, light-headedness and headaches.  Psychiatric/Behavioral: The patient is not nervous/anxious.       Objective:   Physical Exam Vitals signs and nursing note reviewed.  Constitutional:      General: She is not in acute distress.  Appearance: She is not toxic-appearing.  HENT:     Head: Normocephalic and atraumatic.  Cardiovascular:     Rate and Rhythm: Normal rate and regular rhythm.     Heart sounds: Normal heart sounds.  Pulmonary:     Effort: Pulmonary effort is normal. No respiratory distress.     Breath sounds: Normal breath sounds.  Musculoskeletal:     Comments: +trace bilat LE edema around ankles  Skin:    General: Skin is warm and dry.     Coloration: Skin is not jaundiced or pale.  Neurological:     Mental Status: She is alert and oriented to person, place, and time.     Gait: Gait normal.  Psychiatric:        Mood and Affect: Mood normal.        Behavior: Behavior normal.     Today's Vitals   07/17/19 0904  BP: 128/84  Pulse: 94  Temp: (!) 96.2 F (35.7 C)  TempSrc: Temporal  SpO2: 96%  Weight: 203 lb 9.6 oz (92.4 kg)   Body mass index is 36.65 kg/m.     Assessment & Plan:    Essential hypertension  Hyperlipidemia, unspecified hyperlipidemia type  Leg swelling  Suspect patient's leg swelling is directly related to her long-term history of hypertension and hyperlipidemia.  Encouraged patient to wear some sort of compression stocking daily to help combat swelling, keep legs elevated whenever able to and monitor for worsening swelling.  Advised patient that is a good sign that her legs respond to elevation as evidenced by swelling being improved upon waking every  morning.  Patient concerned that this could be related to cholesterol medicine, advised patient that I am not sure that the cholesterol medication directly causes swelling.  I believe the swelling is more so related to long-term history of hypertension, high cholesterol leading to vascular issues over time.  Patient advised that she can trial either half dose of Crestor or no Crestor for a period of time to see how her legs respond, but I do not think taking this medication away will give her the results she is looking for.   Advised patient I would rather her wear compression stockings, elevate legs, monitor salt intake.  Patient will follow-up as already planned with PCP.  Offered to check blood work in clinic today, patient declines.

## 2019-07-17 NOTE — Patient Instructions (Signed)
Edema  Edema is when you have too much fluid in your body or under your skin. Edema may make your legs, feet, and ankles swell up. Swelling is also common in looser tissues, like around your eyes. This is a common condition. It gets more common as you get older. There are many possible causes of edema. Eating too much salt (sodium) and being on your feet or sitting for a long time can cause edema in your legs, feet, and ankles. Hot weather may make edema worse.  Having blood pressure issues and cholesterol issues can cause damage to vessels over the years, increasing your risk of having swelling.  Edema is usually painless. Your skin may look swollen or shiny. Follow these instructions at home:  Keep the swollen body part raised (elevated) above the level of your heart when you are sitting or lying down.  Do not sit still or stand for a long time.  Do not wear tight clothes. Do not wear garters on your upper legs.  Exercise your legs. This can help the swelling go down.  Wear elastic bandages or support stockings as told by your doctor.  Eat a low-salt (low-sodium) diet to reduce fluid as told by your doctor.  Depending on the cause of your swelling, you may need to limit how much fluid you drink (fluid restriction).  Take over-the-counter and prescription medicines only as told by your doctor. Contact a doctor if:  Treatment is not working.  You have heart, liver, or kidney disease and have symptoms of edema.  You have sudden and unexplained weight gain. Get help right away if:  You have shortness of breath or chest pain.  You cannot breathe when you lie down.  You have pain, redness, or warmth in the swollen areas.  You have heart, liver, or kidney disease and get edema all of a sudden.  You have a fever and your symptoms get worse all of a sudden. Summary  Edema is when you have too much fluid in your body or under your skin.  Edema may make your legs, feet, and  ankles swell up. Swelling is also common in looser tissues, like around your eyes.  Raise (elevate) the swollen body part above the level of your heart when you are sitting or lying down.  Follow your doctor's instructions about diet and how much fluid you can drink (fluid restriction). This information is not intended to replace advice given to you by your health care provider. Make sure you discuss any questions you have with your health care provider. Document Released: 02/08/2008 Document Revised: 08/25/2017 Document Reviewed: 09/09/2016 Elsevier Patient Education  2020 [employer].

## 2019-07-26 DIAGNOSIS — Z96641 Presence of right artificial hip joint: Secondary | ICD-10-CM | POA: Diagnosis not present

## 2019-08-07 ENCOUNTER — Other Ambulatory Visit: Payer: TRICARE For Life (TFL)

## 2019-08-19 ENCOUNTER — Telehealth: Payer: Self-pay | Admitting: *Deleted

## 2019-08-19 DIAGNOSIS — E785 Hyperlipidemia, unspecified: Secondary | ICD-10-CM

## 2019-08-19 NOTE — Telephone Encounter (Signed)
Please place future orders for lab appt.  

## 2019-08-19 NOTE — Telephone Encounter (Signed)
Ordered

## 2019-08-21 ENCOUNTER — Other Ambulatory Visit (INDEPENDENT_AMBULATORY_CARE_PROVIDER_SITE_OTHER): Payer: Medicare Other

## 2019-08-21 ENCOUNTER — Other Ambulatory Visit: Payer: Self-pay

## 2019-08-21 DIAGNOSIS — E785 Hyperlipidemia, unspecified: Secondary | ICD-10-CM | POA: Diagnosis not present

## 2019-08-21 LAB — HEPATIC FUNCTION PANEL
ALT: 13 U/L (ref 0–35)
AST: 17 U/L (ref 0–37)
Albumin: 4.4 g/dL (ref 3.5–5.2)
Alkaline Phosphatase: 83 U/L (ref 39–117)
Bilirubin, Direct: 0.1 mg/dL (ref 0.0–0.3)
Total Bilirubin: 0.5 mg/dL (ref 0.2–1.2)
Total Protein: 7.7 g/dL (ref 6.0–8.3)

## 2019-08-21 LAB — LDL CHOLESTEROL, DIRECT: Direct LDL: 170 mg/dL

## 2019-09-05 ENCOUNTER — Encounter: Payer: Self-pay | Admitting: *Deleted

## 2019-09-14 ENCOUNTER — Other Ambulatory Visit: Payer: Self-pay | Admitting: Family Medicine

## 2019-09-14 DIAGNOSIS — E785 Hyperlipidemia, unspecified: Secondary | ICD-10-CM

## 2019-09-14 MED ORDER — ATORVASTATIN CALCIUM 80 MG PO TABS: 80.0000 mg | ORAL_TABLET | Freq: Every day | ORAL | 3 refills | Status: DC

## 2019-10-11 ENCOUNTER — Telehealth: Payer: Self-pay | Admitting: Family Medicine

## 2019-10-11 NOTE — Telephone Encounter (Signed)
Pt states that she needs a refill on amLODipine (NORVASC) 10 MG tablet. Pharmacy asked her to call PCP

## 2019-10-16 ENCOUNTER — Other Ambulatory Visit: Payer: Self-pay | Admitting: Family Medicine

## 2019-10-18 NOTE — Telephone Encounter (Signed)
Medication was sent to pharmacy on 10/16/2019.  Shakeel Disney,cma

## 2019-10-25 ENCOUNTER — Telehealth: Payer: Self-pay | Admitting: Family Medicine

## 2019-10-25 NOTE — Telephone Encounter (Signed)
Left message for patient to call back and schedule Medicare Annual Wellness Visit (AWV) either virtually or audio only.  No hx of AWV per [facility] or [facility]; please schedule at anytime with Denisa O'Brien-Blaney at [facility]

## 2019-10-28 ENCOUNTER — Telehealth: Payer: Self-pay | Admitting: Family Medicine

## 2019-10-28 NOTE — Telephone Encounter (Signed)
Scheduled 2.26.21

## 2019-10-28 NOTE — Telephone Encounter (Signed)
Sent to care guide for follow up scheduling.

## 2019-10-28 NOTE — Telephone Encounter (Signed)
Please return pt's call. I asked pt if I could go ahead and schedule AWV but she wanted to speak with you. Thanks!

## 2019-10-30 ENCOUNTER — Other Ambulatory Visit (INDEPENDENT_AMBULATORY_CARE_PROVIDER_SITE_OTHER): Payer: Medicare Other

## 2019-10-30 ENCOUNTER — Other Ambulatory Visit: Payer: Self-pay

## 2019-10-30 DIAGNOSIS — E785 Hyperlipidemia, unspecified: Secondary | ICD-10-CM | POA: Diagnosis not present

## 2019-10-30 LAB — HEPATIC FUNCTION PANEL
ALT: 13 U/L (ref 0–35)
AST: 13 U/L (ref 0–37)
Albumin: 4.3 g/dL (ref 3.5–5.2)
Alkaline Phosphatase: 98 U/L (ref 39–117)
Bilirubin, Direct: 0.2 mg/dL (ref 0.0–0.3)
Total Bilirubin: 0.8 mg/dL (ref 0.2–1.2)
Total Protein: 7.1 g/dL (ref 6.0–8.3)

## 2019-10-30 LAB — LDL CHOLESTEROL, DIRECT: Direct LDL: 72 mg/dL

## 2019-11-01 ENCOUNTER — Ambulatory Visit (INDEPENDENT_AMBULATORY_CARE_PROVIDER_SITE_OTHER): Payer: Medicare Other

## 2019-11-01 ENCOUNTER — Other Ambulatory Visit: Payer: Self-pay

## 2019-11-01 VITALS — Ht 62.5 in | Wt 203.0 lb

## 2019-11-01 DIAGNOSIS — Z0000 Encounter for general adult medical examination without abnormal findings: Secondary | ICD-10-CM | POA: Diagnosis not present

## 2019-11-01 NOTE — Progress Notes (Signed)
Subjective:   Mary Wolfe is a 69 y.o. female who presents for an Initial Medicare Annual Wellness Visit.  Review of Systems    No ROS.  Medicare Wellness Virtual Visit.  Visual/audio telehealth visit, UTA vital signs.   Ht/Wt provided.  See social history for additional risk factors.    Cardiac Risk Factors include: advanced age (>58men, >97 women);hypertension     Objective:    Today's Vitals   11/01/19 1102  Weight: 203 lb (92.1 kg)  Height: 5' 2.5" (1.588 m)   Body mass index is 36.54 kg/m.  Advanced Directives 11/01/2019 04/16/2019 04/11/2019 06/25/2018  Does Patient Have a Medical Advance Directive? No No No No  Does patient want to make changes to medical advance directive? No - Patient declined - - -  Would patient like information on creating a medical advance directive? - No - Patient declined No - Patient declined -    Current Medications (verified) Outpatient Encounter Medications as of 11/01/2019  Medication Sig  . amLODipine (NORVASC) 10 MG tablet TAKE 1 TABLET(10 MG) BY MOUTH DAILY  . atorvastatin (LIPITOR) 80 MG tablet Take 1 tablet (80 mg total) by mouth daily.  . cholecalciferol (VITAMIN D3) 25 MCG (1000 UT) tablet Take 1,000 Units by mouth daily.  . TURMERIC PO Take 900 mg by mouth daily.    No facility-administered encounter medications on file as of 11/01/2019.    Allergies (verified) Nickel   History: Past Medical History:  Diagnosis Date  . Bursitis   . Diverticulitis   . Hyperlipidemia   . Hypertension   . Iron deficiency anemia   . OA (osteoarthritis)   . Obesity   . PONV (postoperative nausea and vomiting)    Past Surgical History:  Procedure Laterality Date  . CARPAL TUNNEL RELEASE Right   . CESAREAN SECTION    . COLONOSCOPY  03/14/2008  . COLONOSCOPY WITH PROPOFOL N/A 06/25/2018   Procedure: COLONOSCOPY WITH PROPOFOL;  Surgeon: Christena Deem, MD;  Location: [facility] ENDOSCOPY;  Service: Endoscopy;  Laterality: N/A;  .  ESOPHAGOGASTRODUODENOSCOPY  04/01/1997  . ESOPHAGOGASTRODUODENOSCOPY (EGD) WITH PROPOFOL N/A 06/25/2018   Procedure: ESOPHAGOGASTRODUODENOSCOPY (EGD) WITH PROPOFOL;  Surgeon: Christena Deem, MD;  Location: [facility] ENDOSCOPY;  Service: Endoscopy;  Laterality: N/A;  . removed glass from elbow     from accident  . TONSILLECTOMY    . TOTAL HIP ARTHROPLASTY Right 04/16/2019   Procedure: Right Anterior Total Hip Arthroplasty;  Surgeon: Marcene Corning, MD;  Location: [facility] ORS;  Service: Orthopedics;  Laterality: Right;   Family History  Problem Relation Age of Onset  . Heart disease Other   . Arthritis Other   . Breast cancer Maternal Aunt 53   Social History   Socioeconomic History  . Marital status: Married    Spouse name: Not on file  . Number of children: Not on file  . Years of education: Not on file  . Highest education level: Not on file  Occupational History  . Not on file  Tobacco Use  . Smoking status: Never Smoker  . Smokeless tobacco: Never Used  Substance and Sexual Activity  . Alcohol use: No    Alcohol/week: 0.0 standard drinks  . Drug use: No  . Sexual activity: Yes    Birth control/protection: None  Other Topics Concern  . Not on file  Social History Narrative  . Not on file   Social Determinants of Health   Financial Resource Strain:   . Difficulty of Paying Living  Expenses: Not on file  Food Insecurity:   . Worried About Programme researcher, broadcasting/film/video in the Last Year: Not on file  . Ran Out of Food in the Last Year: Not on file  Transportation Needs:   . Lack of Transportation (Medical): Not on file  . Lack of Transportation (Non-Medical): Not on file  Physical Activity:   . Days of Exercise per Week: Not on file  . Minutes of Exercise per Session: Not on file  Stress:   . Feeling of Stress : Not on file  Social Connections:   . Frequency of Communication with Friends and Family: Not on file  . Frequency of Social Gatherings with Friends and Family: Not on  file  . Attends Religious Services: Not on file  . Active Member of Clubs or Organizations: Not on file  . Attends Banker Meetings: Not on file  . Marital Status: Not on file    Tobacco Counseling Counseling given: Not Answered   Clinical Intake:  Pre-visit preparation completed: Yes        Diabetes: No  How often do you need to have someone help you when you read instructions, pamphlets, or other written materials from your doctor or pharmacy?: 1 - Never  Interpreter Needed?: No      Activities of Daily Living In your present state of health, do you have any difficulty performing the following activities: 11/01/2019 04/16/2019  Hearing? N -  Vision? N -  Difficulty concentrating or making decisions? N -  Walking or climbing stairs? N -  Dressing or bathing? N -  Doing errands, shopping? N N  Preparing Food and eating ? N -  Using the Toilet? N -  In the past six months, have you accidently leaked urine? N -  Do you have problems with loss of bowel control? N -  Managing your Medications? N -  Managing your Finances? N -  Housekeeping or managing your Housekeeping? N -  Some recent data might be hidden     Immunizations and Health Maintenance Immunization History  Administered Date(s) Administered  . Tdap 06/24/2016  . Zoster 06/24/2016   There are no preventive care reminders to display for this patient.  Patient Care Team: Glori Luis, MD as PCP - General (Family Medicine)  Indicate any recent Medical Services you may have received from other than [facility] providers in the past year (date may be approximate).     Assessment:   This is a routine wellness examination for [place].  Nurse connected with patient 11/01/19 at 11:00 AM EST by a telephone enabled telemedicine application and verified that I am speaking with the correct person using two identifiers. Patient stated full name and DOB. Patient gave permission to continue with virtual  visit. Patient's location was at home and Nurse's location was at [place] office.   Patient is alert and oriented x3. Patient denies difficulty focusing or concentrating. Patient likes to read and participates in Bible class for brain stimulation.   Health Maintenance Due: See completed HM at the end of note.   Eye: Visual acuity not assessed. Virtual visit. Followed by their ophthalmologist.  Dental: Visits every 6 months.    Hearing: Demonstrates normal hearing during visit.  Safety:  Patient feels safe at home- yes Patient does have smoke detectors at home- yes Patient does wear sunscreen or protective clothing when in direct sunlight - yes Patient does wear seat belt when in a moving vehicle - yes Patient drives-  yes Adequate lighting in walkways free from debris- yes Grab bars and handrails used as appropriate- yes Ambulates with an assistive device- no Cell phone on person when ambulating outside of the home- yes  Social: Alcohol intake - no  Smoking history- never   Smokers in home? none Illicit drug use? none  Medication: Taking as directed and without issues.  Self managed - yes  Ted hose in use- yes  Covid-19: Precautions and sickness symptoms discussed. Wears mask, social distancing, hand hygiene as appropriate.   Activities of Daily Living Patient denies needing assistance with: household chores, feeding themselves, getting from bed to chair, getting to the toilet, bathing/showering, dressing, managing money, or preparing meals.   Discussed the importance of a healthy diet, water intake and the benefits of aerobic exercise.   Physical activity- active around the home. She plans to walk in the Spring.  Diet:  Low cholesterol  Water: 6-8 cups daily  Other Providers Patient Care Team: Leone Haven, MD as PCP - General (Family Medicine) Hearing/Vision screen  Hearing Screening   125Hz  250Hz  500Hz  1000Hz  2000Hz  3000Hz  4000Hz  6000Hz  8000Hz   Right  ear:           Left ear:           Comments: Patient is able to hear conversational tones without difficulty.  No issues reported.  Vision Screening Comments: Wears corrective lenses Visual acuity not assessed, virtual visit.  They have seen their ophthalmologist in the last 12 months.     Dietary issues and exercise activities discussed: Current Exercise Habits: Home exercise routine, Intensity: Mild  Goals    . Increase physical activity     Walk for exercise       Depression Screen PHQ 2/9 Scores 11/01/2019 06/26/2019 10/18/2017  PHQ - 2 Score 0 0 0    Fall Risk Fall Risk  11/01/2019 06/26/2019 01/19/2018  Falls in the past year? 0 0 No  Number falls in past yr: - 0 -  Follow up Falls evaluation completed Falls evaluation completed -    Timed Get Up and Go Performed no, virtual visit  Cognitive Function:     6CIT Screen 11/01/2019  What Year? 0 points  What month? 0 points  What time? 0 points  Count back from 20 0 points  Months in reverse 0 points  Repeat phrase 0 points  Total Score 0    Screening Tests Health Maintenance  Topic Date Due  . INFLUENZA VACCINE  12/04/2019 (Originally 04/06/2019)  . MAMMOGRAM  02/18/2020 (Originally 08/03/2018)  . DEXA SCAN  06/25/2020 (Originally 12/09/2017)  . PNA vac Low Risk Adult (1 of 2 - PCV13) 06/25/2020 (Originally 12/09/2017)  . TETANUS/TDAP  06/24/2026  . COLONOSCOPY  06/25/2028  . Hepatitis C Screening  Completed      Plan:   Keep all routine maintenance appointments.   Follow up 12/25/19 @ 930  Spot checking blood pressure at home. Plans to send via mychart to her doctor. Ted hose/complression socks worn for minimal ankle swelling   Medicare Attestation I have personally reviewed: The patient's medical and social history Their use of alcohol, tobacco or illicit drugs Their current medications and supplements The patient's functional ability including ADLs,fall risks, home safety risks, cognitive, and hearing  and visual impairment Diet and physical activities Evidence for depression   I have reviewed and discussed with patient certain preventive protocols, quality metrics, and best practice recommendations.      OBrien-Blaney, Leeona Mccardle L, LPN  11/01/2019      

## 2019-11-01 NOTE — Patient Instructions (Addendum)
  Ms. Stuckey , Thank you for taking time to come for your Medicare Wellness Visit. I appreciate your ongoing commitment to your health goals. Please review the following plan we discussed and let me know if I can assist you in the future.   These are the goals we discussed: Goals    . Increase physical activity     Walk for exercise        This is a list of the screening recommended for you and due dates:  Health Maintenance  Topic Date Due  . Flu Shot  12/04/2019*  . Mammogram  02/18/2020*  . DEXA scan (bone density measurement)  06/25/2020*  . Pneumonia vaccines (1 of 2 - PCV13) 06/25/2020*  . Tetanus Vaccine  06/24/2026  . Colon Cancer Screening  06/25/2028  .  Hepatitis C: One time screening is recommended by Center for Disease Control  (CDC) for  adults born from [birth year] through 1963.   Completed  *Topic was postponed. The date shown is not the original due date.

## 2019-12-23 ENCOUNTER — Encounter: Payer: Self-pay | Admitting: Family Medicine

## 2019-12-25 ENCOUNTER — Encounter: Payer: Self-pay | Admitting: Family Medicine

## 2019-12-25 ENCOUNTER — Ambulatory Visit (INDEPENDENT_AMBULATORY_CARE_PROVIDER_SITE_OTHER): Payer: Medicare Other | Admitting: Family Medicine

## 2019-12-25 ENCOUNTER — Other Ambulatory Visit: Payer: Self-pay

## 2019-12-25 VITALS — BP 120/80 | HR 84 | Temp 96.5°F | Ht 62.5 in | Wt 210.0 lb

## 2019-12-25 DIAGNOSIS — Z6837 Body mass index (BMI) 37.0-37.9, adult: Secondary | ICD-10-CM

## 2019-12-25 DIAGNOSIS — R202 Paresthesia of skin: Secondary | ICD-10-CM | POA: Diagnosis not present

## 2019-12-25 DIAGNOSIS — E785 Hyperlipidemia, unspecified: Secondary | ICD-10-CM | POA: Diagnosis not present

## 2019-12-25 DIAGNOSIS — I10 Essential (primary) hypertension: Secondary | ICD-10-CM

## 2019-12-25 MED ORDER — PRAVASTATIN SODIUM 10 MG PO TABS: ORAL_TABLET | ORAL | 1 refills | Status: DC

## 2019-12-25 NOTE — Assessment & Plan Note (Signed)
We will trial low-dose pravastatin 3 days a week to see if she can tolerate this.  Plan to recheck labs in about 6 weeks.  If needed we could add on Zetia.

## 2019-12-25 NOTE — Patient Instructions (Signed)
Nice to see you. Please monitor the tingling.  If it worsens or does not resolve please let us know and we can refer to neurology. We will try pravastatin for your cholesterol.  Please take this 3 days a week as prescribed.  If you develop joint pain or muscle pain please discontinue it and let us know. Please continue to work on diet and exercise.

## 2019-12-25 NOTE — Progress Notes (Signed)
Tommi Rumps, MD Phone: [phone]  Mary Wolfe is a 69 y.o. female who presents today for f/u.  HYPERTENSION  Disease Monitoring  Home BP Monitoring see mychart message from yesterday with readings Chest pain- no    Dyspnea- no Medications  Compliance-  Taking amlodipine.   Edema- initially with statins, though it has improved with stopping lipitor  HLD: unable to tolerate lipitor given myalgia and arthralgia. No abdominal pain.  Left lateral thigh tingling: Patient notes this has been going on for 5 or so months.  It has improved some at this time.  Only occurs at night when she lays down.  When she gets up and goes to the bathroom comes back and lays down it does not recur.  No back pain.  Does not go below her knee.  No numbness.  No weakness.  Obesity: Patient notes she is doing better on her diet.  She started doing an exercise video twice daily.    Social History   Tobacco Use  Smoking Status Never Smoker  Smokeless Tobacco Never Used     ROS see history of present illness  Objective  Physical Exam Vitals:   12/25/19 0927  BP: 120/80  Pulse: 84  Temp: (!) 96.5 F (35.8 C)  SpO2: 99%    BP Readings from Last 3 Encounters:  12/25/19 120/80  07/17/19 128/84  06/26/19 130/80   Wt Readings from Last 3 Encounters:  12/25/19 210 lb (95.3 kg)  11/01/19 203 lb (92.1 kg)  07/17/19 203 lb 9.6 oz (92.4 kg)    Physical Exam Constitutional:      General: She is not in acute distress.    Appearance: She is not diaphoretic.  Cardiovascular:     Rate and Rhythm: Normal rate and regular rhythm.     Heart sounds: Normal heart sounds.  Pulmonary:     Effort: Pulmonary effort is normal.     Breath sounds: Normal breath sounds.  Musculoskeletal:     Comments: No midline spine tenderness, no midline spine step-off, no muscular back tenderness, no tenderness over either lateral thigh  Skin:    General: Skin is warm and dry.  Neurological:     Mental Status:  She is alert.     Comments: 5/5 strength bilateral quads, hamstrings, plantar flexion, and dorsiflexion, sensation light touch intact bilateral lower extremities      Assessment/Plan: Please see individual problem list.  Left lateral thigh tingling Suspect nerve impingement given that it only occurs when she lays down at night.  It has been improving and thus we will monitor for another 3 to 4 weeks.  If it does not resolve or if it worsens she will let us know sooner and we can refer to neurology.  Obesity Encouraged continued diet and exercise.  Essential hypertension Diastolic BP is slightly high.  Patient is going to be working on exercise and dietary changes.  Discussed doing these things for about 3 months and then we can recheck her blood pressure.  If it is still borderline elevated we would need to consider adding additional medication.  Hyperlipidemia We will trial low-dose pravastatin 3 days a week to see if she can tolerate this.  Plan to recheck labs in about 6 weeks.  If needed we could add on Zetia.   Health Maintenance: Encouraged the patient to get her COVID-19 vaccine.  Orders Placed This Encounter  Procedures  . LDL cholesterol, direct    Standing Status:   Future  Standing Expiration Date:   12/24/2020  . Hepatic function panel    Standing Status:   Future    Standing Expiration Date:   12/24/2020    Meds ordered this encounter  Medications  . pravastatin (PRAVACHOL) 10 MG tablet    Sig: Take 1 tablet (10 mg) by mouth on Monday, Wednesday, and Friday    Dispense:  36 tablet    Refill:  1    This visit occurred during the SARS-CoV-2 public health emergency.  Safety protocols were in place, including screening questions prior to the visit, additional usage of staff PPE, and extensive cleaning of exam room while observing appropriate contact time as indicated for disinfecting solutions.    Marikay Alar, MD [facility]

## 2019-12-25 NOTE — Assessment & Plan Note (Signed)
Encouraged continued diet and exercise. 

## 2019-12-25 NOTE — Assessment & Plan Note (Addendum)
Diastolic BP is slightly high.  Patient is going to be working on exercise and dietary changes.  Discussed doing these things for about 3 months and then we can recheck her blood pressure.  If it is still borderline elevated we would need to consider adding additional medication.

## 2019-12-25 NOTE — Assessment & Plan Note (Signed)
Suspect nerve impingement given that it only occurs when she lays down at night.  It has been improving and thus we will monitor for another 3 to 4 weeks.  If it does not resolve or if it worsens she will let us know sooner and we can refer to neurology.

## 2019-12-27 ENCOUNTER — Encounter: Payer: Self-pay | Admitting: Family Medicine

## 2020-01-22 DIAGNOSIS — H5213 Myopia, bilateral: Secondary | ICD-10-CM | POA: Diagnosis not present

## 2020-01-22 DIAGNOSIS — H52223 Regular astigmatism, bilateral: Secondary | ICD-10-CM | POA: Diagnosis not present

## 2020-01-22 DIAGNOSIS — H524 Presbyopia: Secondary | ICD-10-CM | POA: Diagnosis not present

## 2020-01-22 DIAGNOSIS — H2513 Age-related nuclear cataract, bilateral: Secondary | ICD-10-CM | POA: Diagnosis not present

## 2020-01-22 DIAGNOSIS — H40013 Open angle with borderline findings, low risk, bilateral: Secondary | ICD-10-CM | POA: Diagnosis not present

## 2020-01-22 DIAGNOSIS — H35371 Puckering of macula, right eye: Secondary | ICD-10-CM | POA: Diagnosis not present

## 2020-01-22 DIAGNOSIS — H35033 Hypertensive retinopathy, bilateral: Secondary | ICD-10-CM | POA: Diagnosis not present

## 2020-02-05 ENCOUNTER — Other Ambulatory Visit (INDEPENDENT_AMBULATORY_CARE_PROVIDER_SITE_OTHER): Payer: Medicare Other

## 2020-02-05 ENCOUNTER — Other Ambulatory Visit: Payer: Self-pay

## 2020-02-05 DIAGNOSIS — E785 Hyperlipidemia, unspecified: Secondary | ICD-10-CM | POA: Diagnosis not present

## 2020-02-05 LAB — HEPATIC FUNCTION PANEL
ALT: 12 U/L (ref 0–35)
AST: 14 U/L (ref 0–37)
Albumin: 4.5 g/dL (ref 3.5–5.2)
Alkaline Phosphatase: 99 U/L (ref 39–117)
Bilirubin, Direct: 0.1 mg/dL (ref 0.0–0.3)
Total Bilirubin: 0.5 mg/dL (ref 0.2–1.2)
Total Protein: 7.2 g/dL (ref 6.0–8.3)

## 2020-02-05 LAB — LDL CHOLESTEROL, DIRECT: Direct LDL: 135 mg/dL

## 2020-02-06 ENCOUNTER — Other Ambulatory Visit: Payer: Self-pay | Admitting: Family Medicine

## 2020-02-12 ENCOUNTER — Other Ambulatory Visit: Payer: Self-pay | Admitting: Family Medicine

## 2020-02-12 DIAGNOSIS — E785 Hyperlipidemia, unspecified: Secondary | ICD-10-CM

## 2020-02-24 ENCOUNTER — Telehealth: Payer: Self-pay | Admitting: Family Medicine

## 2020-02-24 DIAGNOSIS — E785 Hyperlipidemia, unspecified: Secondary | ICD-10-CM

## 2020-02-24 NOTE — Telephone Encounter (Signed)
Pt called in stated that she could not take thepravastatin (PRAVACHOL) 10 MG tablet  Because it is giving her headache and muscle pain.

## 2020-02-25 MED ORDER — EZETIMIBE 10 MG PO TABS: 10.0000 mg | ORAL_TABLET | Freq: Every day | ORAL | 3 refills | Status: DC

## 2020-02-25 NOTE — Telephone Encounter (Signed)
Noted. Would she be willing to take zetia for her cholesterol? This is not a statin. If she is please send in zetia 10 mg tablets with directions to take 1 tablet by mouth once daily. 90 tablets with 3 refills. She would need labs about 6 weeks after starting this.

## 2020-02-25 NOTE — Telephone Encounter (Signed)
I called and spoke with the patient and informed her that the provider was stopping her pravastatin and starting her on Zetia and she has an lab appointment to recheck.  Edy Mcbane,cma

## 2020-02-25 NOTE — Telephone Encounter (Signed)
Pt called in stated that she could not take thepravastatin (PRAVACHOL) 10 MG tablet  Because it is giving her headache and muscle pain. Mary Wolfe,cma

## 2020-03-30 ENCOUNTER — Other Ambulatory Visit: Payer: TRICARE For Life (TFL)

## 2020-03-31 ENCOUNTER — Other Ambulatory Visit (INDEPENDENT_AMBULATORY_CARE_PROVIDER_SITE_OTHER): Payer: Medicare Other

## 2020-03-31 ENCOUNTER — Other Ambulatory Visit: Payer: Self-pay

## 2020-03-31 DIAGNOSIS — E785 Hyperlipidemia, unspecified: Secondary | ICD-10-CM

## 2020-03-31 LAB — LDL CHOLESTEROL, DIRECT: Direct LDL: 165 mg/dL

## 2020-03-31 LAB — HEPATIC FUNCTION PANEL
ALT: 12 U/L (ref 0–35)
AST: 15 U/L (ref 0–37)
Albumin: 4.3 g/dL (ref 3.5–5.2)
Alkaline Phosphatase: 79 U/L (ref 39–117)
Bilirubin, Direct: 0.1 mg/dL (ref 0.0–0.3)
Total Bilirubin: 0.7 mg/dL (ref 0.2–1.2)
Total Protein: 7 g/dL (ref 6.0–8.3)

## 2020-04-01 ENCOUNTER — Ambulatory Visit: Payer: TRICARE For Life (TFL) | Admitting: Family Medicine

## 2020-04-06 ENCOUNTER — Other Ambulatory Visit: Payer: Self-pay | Admitting: Family Medicine

## 2020-04-08 ENCOUNTER — Ambulatory Visit (INDEPENDENT_AMBULATORY_CARE_PROVIDER_SITE_OTHER): Payer: Medicare Other

## 2020-04-08 ENCOUNTER — Ambulatory Visit (INDEPENDENT_AMBULATORY_CARE_PROVIDER_SITE_OTHER): Payer: Medicare Other | Admitting: Family Medicine

## 2020-04-08 ENCOUNTER — Encounter: Payer: Self-pay | Admitting: Family Medicine

## 2020-04-08 ENCOUNTER — Other Ambulatory Visit: Payer: Self-pay

## 2020-04-08 VITALS — BP 115/80 | HR 88 | Temp 98.3°F | Ht 62.5 in | Wt 210.0 lb

## 2020-04-08 DIAGNOSIS — E785 Hyperlipidemia, unspecified: Secondary | ICD-10-CM | POA: Diagnosis not present

## 2020-04-08 DIAGNOSIS — I10 Essential (primary) hypertension: Secondary | ICD-10-CM

## 2020-04-08 DIAGNOSIS — G8929 Other chronic pain: Secondary | ICD-10-CM | POA: Diagnosis not present

## 2020-04-08 DIAGNOSIS — M19012 Primary osteoarthritis, left shoulder: Secondary | ICD-10-CM | POA: Diagnosis not present

## 2020-04-08 DIAGNOSIS — Z780 Asymptomatic menopausal state: Secondary | ICD-10-CM

## 2020-04-08 DIAGNOSIS — Z1231 Encounter for screening mammogram for malignant neoplasm of breast: Secondary | ICD-10-CM | POA: Diagnosis not present

## 2020-04-08 DIAGNOSIS — M25512 Pain in left shoulder: Secondary | ICD-10-CM

## 2020-04-08 MED ORDER — ROSUVASTATIN CALCIUM 5 MG PO TABS: 5.0000 mg | ORAL_TABLET | ORAL | 1 refills | Status: DC

## 2020-04-08 NOTE — Assessment & Plan Note (Signed)
Uncontrolled.  She is had difficulty tolerating multiple statins.  We need to try Crestor 5 mg 3 times weekly if she cannot tolerate that we can try to get her approved for Repatha.  Continue Zetia.

## 2020-04-08 NOTE — Progress Notes (Signed)
Tommi Rumps, MD Phone: [phone]  Mary Wolfe is a 69 y.o. female who presents today for f/u.  HYPERLIPIDEMIA Symptoms Chest pain on exertion:  no    Medications: Compliance- zetia Right upper quadrant pain- no  Muscle aches- no Has tried crestor 40 mg daily, lipitor 80 mg daily, and pravastatin 10 mg 3x/week with myalgias on all of them.   HYPERTENSION  Disease Monitoring  Chest pain- no    Dyspnea- no Medications  Compliance-  Taking amlodipine.  Edema- no  Left shoulder pain: Patient notes this was going on for several years.  She had some falls back in 2019 where she fell onto her left side and she pretty much ignored the symptoms.  She notes it hurts with abduction.  Does not hurt every day.  She has been taking Aleve 2 tablets over-the-counter at a time to help with the discomfort.      Social History   Tobacco Use  Smoking Status Never Smoker  Smokeless Tobacco Never Used     ROS see history of present illness  Objective  Physical Exam Vitals:   04/08/20 0937  BP: 115/80  Pulse: 88  Temp: 98.3 F (36.8 C)  SpO2: 99%    BP Readings from Last 3 Encounters:  04/08/20 115/80  12/25/19 120/80  07/17/19 128/84   Wt Readings from Last 3 Encounters:  04/08/20 210 lb (95.3 kg)  12/25/19 210 lb (95.3 kg)  11/01/19 203 lb (92.1 kg)    Physical Exam Constitutional:      General: She is not in acute distress.    Appearance: She is not diaphoretic.  Cardiovascular:     Rate and Rhythm: Normal rate and regular rhythm.     Heart sounds: Normal heart sounds.  Pulmonary:     Effort: Pulmonary effort is normal.     Breath sounds: Normal breath sounds.  Musculoskeletal:     Comments: Bilateral shoulders symmetric, there is discomfort on palpation of the left trapezius muscle towards the shoulder, there is spasm, she has full range of motion bilateral shoulders with discomfort on active and passive abduction on the left, positive empty can on the left,  negative drop arm test  Skin:    General: Skin is warm and dry.  Neurological:     Mental Status: She is alert.      Assessment/Plan: Please see individual problem list.  Essential hypertension Well-controlled.  Continue current regimen.  Hyperlipidemia Uncontrolled.  She is had difficulty tolerating multiple statins.  We need to try Crestor 5 mg 3 times weekly if she cannot tolerate that we can try to get her approved for Repatha.  Continue Zetia.  Left shoulder pain Suspect rotator cuff tendinitis as well as spasm in her left trapezius muscle.  Discussed the option of physical therapy versus home exercises and she opted for home exercises.  Discussed a 5-day course of over-the-counter Aleve 2 tablets every 12 hours with food for 5 days.  If she has stomach upset with that she will let us know.   Health Maintenance: Mammogram and DEXA scan ordered.  Patient will call to schedule those.  Orders Placed This Encounter  Procedures  . DG Shoulder Left    Standing Status:   Future    Number of Occurrences:   1    Standing Expiration Date:   04/08/2021    Order Specific Question:   Reason for Exam (SYMPTOM  OR DIAGNOSIS REQUIRED)    Answer:   chronic left shoulder  pain, fall 2 years ago on to shoulder    Order Specific Question:   Preferred imaging location?    Answer:   [facility] Specific Question:   Radiology Contrast Protocol - do NOT remove file path    Answer:   [email or web address]  . MM 3D SCREEN BREAST BILATERAL    Standing Status:   Future    Standing Expiration Date:   04/08/2021    Order Specific Question:   Reason for Exam (SYMPTOM  OR DIAGNOSIS REQUIRED)    Answer:   breast cancer screening    Order Specific Question:   Preferred imaging location?    Answer:   [facility]  . DG Bone Density    Standing Status:   Future    Standing Expiration Date:   04/08/2021    Order Specific Question:   Reason for  Exam (SYMPTOM  OR DIAGNOSIS REQUIRED)    Answer:   postmenopausal estrogen deficiency, osteoporosis screening    Order Specific Question:   Preferred imaging location?    Answer:   [facility]  . Comp Met (CMET)    Standing Status:   Future    Standing Expiration Date:   04/08/2021  . Lipid panel    Standing Status:   Future    Standing Expiration Date:   04/08/2021    Meds ordered this encounter  Medications  . rosuvastatin (CRESTOR) 5 MG tablet    Sig: Take 1 tablet (5 mg total) by mouth 3 (three) times a week. On Monday, Wednesday, Friday.    Dispense:  13 tablet    Refill:  1    This visit occurred during the SARS-CoV-2 public health emergency.  Safety protocols were in place, including screening questions prior to the visit, additional usage of staff PPE, and extensive cleaning of exam room while observing appropriate contact time as indicated for disinfecting solutions.    Tommi Rumps, MD [place]

## 2020-04-08 NOTE — Assessment & Plan Note (Signed)
Well-controlled.  Continue current regimen. 

## 2020-04-08 NOTE — Patient Instructions (Addendum)
Nice to see you. Please call to schedule your mammogram and bone density scan. We will contact you with your x-ray result. We will try Crestor 5 mg Monday, Wednesday, and Friday to see if you can tolerate this.  We will repeat labs in about a month.  If you cannot tolerate this please let us know we will try to get you approved for Repatha.   Shoulder Impingement Syndrome Rehab Ask your health care provider which exercises are safe for you. Do exercises exactly as told by your health care provider and adjust them as directed. It is normal to feel mild stretching, pulling, tightness, or discomfort as you do these exercises. Stop right away if you feel sudden pain or your pain gets worse. Do not begin these exercises until told by your health care provider. Stretching and range-of-motion exercise This exercise warms up your muscles and joints and improves the movement and flexibility of your shoulder. This exercise also helps to relieve pain and stiffness. Passive horizontal adduction In passive adduction, you use your other hand to move the injured arm toward your body. The injured arm does not move on its own. In this movement, your arm is moved across your body in the horizontal plane (horizontal adduction). 1. Sit or stand and pull your left / right elbow across your chest, toward your other shoulder. Stop when you feel a gentle stretch in the back of your shoulder and upper arm. ? Keep your arm at shoulder height. ? Keep your arm as close to your body as you comfortably can. 2. Hold for __________ seconds. 3. Slowly return to the starting position. Repeat __________ times. Complete this exercise __________ times a day. Strengthening exercises These exercises build strength and endurance in your shoulder. Endurance is the ability to use your muscles for a long time, even after they get tired. External rotation, isometric This is an exercise in which you press the back of your wrist against a  door frame without moving your shoulder joint (isometric). 1. Stand or sit in a doorway, facing the door frame. 2. Bend your left / right elbow and place the back of your wrist against the door frame. Only the back of your wrist should be touching the frame. Keep your upper arm at your side. 3. Gently press your wrist against the door frame, as if you are trying to push your arm away from your abdomen (external rotation). Press as hard as you are able without pain. ? Avoid shrugging your shoulder while you press your wrist against the door frame. Keep your shoulder blade tucked down toward the middle of your back. 4. Hold for __________ seconds. 5. Slowly release the tension, and relax your muscles completely before you repeat the exercise. Repeat __________ times. Complete this exercise __________ times a day. Internal rotation, isometric This is an exercise in which you press your palm against a door frame without moving your shoulder joint (isometric). 1. Stand or sit in a doorway, facing the door frame. 2. Bend your left / right elbow and place the palm of your hand against the door frame. Only your palm should be touching the frame. Keep your upper arm at your side. 3. Gently press your hand against the door frame, as if you are trying to push your arm toward your abdomen (internal rotation). Press as hard as you are able without pain. ? Avoid shrugging your shoulder while you press your hand against the door frame. Keep your shoulder blade tucked down  toward the middle of your back. 4. Hold for __________ seconds. 5. Slowly release the tension, and relax your muscles completely before you repeat the exercise. Repeat __________ times. Complete this exercise __________ times a day. Scapular protraction, supine  1. Lie on your back on a firm surface (supine position). Hold a __________ weight in your left / right hand. 2. Raise your left / right arm straight into the air so your hand is  directly above your shoulder joint. 3. Push the weight into the air so your shoulder (scapula) lifts off the surface that you are lying on. The scapula will push up or forward (protraction). Do not move your head, neck, or back. 4. Hold for __________ seconds. 5. Slowly return to the starting position. Let your muscles relax completely before you repeat this exercise. Repeat __________ times. Complete this exercise __________ times a day. Scapular retraction  1. Sit in a stable chair without armrests, or stand up. 2. Secure an exercise band to a stable object in front of you so the band is at shoulder height. 3. Hold one end of the exercise band in each hand. Your palms should face down. 4. Squeeze your shoulder blades together (retraction) and move your elbows slightly behind you. Do not shrug your shoulders upward while you do this. 5. Hold for __________ seconds. 6. Slowly return to the starting position. Repeat __________ times. Complete this exercise __________ times a day. Shoulder extension  1. Sit in a stable chair without armrests, or stand up. 2. Secure an exercise band to a stable object in front of you so the band is above shoulder height. 3. Hold one end of the exercise band in each hand. 4. Straighten your elbows and lift your hands up to shoulder height. 5. Squeeze your shoulder blades together and pull your hands down to the sides of your thighs (extension). Stop when your hands are straight down by your sides. Do not let your hands go behind your body. 6. Hold for __________ seconds. 7. Slowly return to the starting position. Repeat __________ times. Complete this exercise __________ times a day. This information is not intended to replace advice given to you by your health care provider. Make sure you discuss any questions you have with your health care provider. Document Revised: 12/14/2018 Document Reviewed: 09/17/2018 Elsevier Patient Education  2020 [employer].

## 2020-04-08 NOTE — Assessment & Plan Note (Signed)
Suspect rotator cuff tendinitis as well as spasm in her left trapezius muscle.  Discussed the option of physical therapy versus home exercises and she opted for home exercises.  Discussed a 5-day course of over-the-counter Aleve 2 tablets every 12 hours with food for 5 days.  If she has stomach upset with that she will let us know.

## 2020-04-15 ENCOUNTER — Ambulatory Visit
Admission: RE | Admit: 2020-04-15 | Discharge: 2020-04-15 | Disposition: A | Discharge status: Home / Self Care | Payer: Medicare Other | Source: Ambulatory Visit | Attending: Family Medicine | Admitting: Family Medicine

## 2020-04-15 ENCOUNTER — Other Ambulatory Visit: Payer: Self-pay

## 2020-04-15 DIAGNOSIS — Z1231 Encounter for screening mammogram for malignant neoplasm of breast: Secondary | ICD-10-CM | POA: Diagnosis not present

## 2020-04-16 ENCOUNTER — Other Ambulatory Visit: Payer: Self-pay | Admitting: Family Medicine

## 2020-04-22 ENCOUNTER — Other Ambulatory Visit: Payer: Self-pay

## 2020-04-22 ENCOUNTER — Ambulatory Visit
Admission: RE | Admit: 2020-04-22 | Discharge: 2020-04-22 | Disposition: A | Discharge status: Home / Self Care | Payer: Medicare Other | Source: Ambulatory Visit | Attending: Family Medicine | Admitting: Family Medicine

## 2020-04-22 DIAGNOSIS — Z13820 Encounter for screening for osteoporosis: Secondary | ICD-10-CM | POA: Diagnosis not present

## 2020-04-22 DIAGNOSIS — Z780 Asymptomatic menopausal state: Secondary | ICD-10-CM | POA: Insufficient documentation

## 2020-04-27 ENCOUNTER — Telehealth: Payer: Self-pay | Admitting: Family Medicine

## 2020-04-27 MED ORDER — AMLODIPINE BESYLATE 10 MG PO TABS: ORAL_TABLET | ORAL | 1 refills | Status: DC

## 2020-04-27 NOTE — Telephone Encounter (Signed)
Pt would like a refill on amLODipine (NORVASC) 10 MG tablet sent to [facility] on [address]

## 2020-05-28 ENCOUNTER — Other Ambulatory Visit: Payer: Self-pay

## 2020-05-28 ENCOUNTER — Other Ambulatory Visit (INDEPENDENT_AMBULATORY_CARE_PROVIDER_SITE_OTHER): Payer: Medicare Other

## 2020-05-28 DIAGNOSIS — E785 Hyperlipidemia, unspecified: Secondary | ICD-10-CM | POA: Diagnosis not present

## 2020-05-28 LAB — COMPREHENSIVE METABOLIC PANEL
ALT: 9 U/L (ref 0–35)
AST: 12 U/L (ref 0–37)
Albumin: 4.3 g/dL (ref 3.5–5.2)
Alkaline Phosphatase: 80 U/L (ref 39–117)
BUN: 20 mg/dL (ref 6–23)
CO2: 27 mEq/L (ref 19–32)
Calcium: 9.3 mg/dL (ref 8.4–10.5)
Chloride: 106 mEq/L (ref 96–112)
Creatinine, Ser: 0.79 mg/dL (ref 0.40–1.20)
GFR: 87.71 mL/min (ref >60.00)
Glucose, Bld: 96 mg/dL (ref 70–99)
Potassium: 3.8 mEq/L (ref 3.5–5.1)
Sodium: 141 mEq/L (ref 135–145)
Total Bilirubin: 0.7 mg/dL (ref 0.2–1.2)
Total Protein: 7.4 g/dL (ref 6.0–8.3)

## 2020-05-28 LAB — LIPID PANEL
Cholesterol: 170 mg/dL (ref 0–200)
HDL: 50.9 mg/dL (ref >39.00)
LDL Cholesterol: 106 mg/dL — ABNORMAL HIGH (ref 0–99)
NonHDL: 119.1
Total CHOL/HDL Ratio: 3
Triglycerides: 66 mg/dL (ref 0.0–149.0)
VLDL: 13.2 mg/dL (ref 0.0–40.0)

## 2020-06-09 ENCOUNTER — Other Ambulatory Visit: Payer: Self-pay | Admitting: Family Medicine

## 2020-06-09 DIAGNOSIS — E785 Hyperlipidemia, unspecified: Secondary | ICD-10-CM

## 2020-06-11 ENCOUNTER — Telehealth: Payer: Self-pay | Admitting: Family Medicine

## 2020-06-11 NOTE — Telephone Encounter (Signed)
Patient returned Mary Wolfe's call. Please call her back at [phone], at work.

## 2020-07-27 DIAGNOSIS — H35033 Hypertensive retinopathy, bilateral: Secondary | ICD-10-CM | POA: Diagnosis not present

## 2020-07-27 DIAGNOSIS — H04123 Dry eye syndrome of bilateral lacrimal glands: Secondary | ICD-10-CM | POA: Diagnosis not present

## 2020-07-27 DIAGNOSIS — H52223 Regular astigmatism, bilateral: Secondary | ICD-10-CM | POA: Diagnosis not present

## 2020-07-27 DIAGNOSIS — H5213 Myopia, bilateral: Secondary | ICD-10-CM | POA: Diagnosis not present

## 2020-07-27 DIAGNOSIS — H2513 Age-related nuclear cataract, bilateral: Secondary | ICD-10-CM | POA: Diagnosis not present

## 2020-07-27 DIAGNOSIS — H524 Presbyopia: Secondary | ICD-10-CM | POA: Diagnosis not present

## 2020-07-27 DIAGNOSIS — H35371 Puckering of macula, right eye: Secondary | ICD-10-CM | POA: Diagnosis not present

## 2020-07-27 DIAGNOSIS — H40013 Open angle with borderline findings, low risk, bilateral: Secondary | ICD-10-CM | POA: Diagnosis not present

## 2020-09-11 ENCOUNTER — Other Ambulatory Visit: Payer: Self-pay | Admitting: Family Medicine

## 2020-09-11 DIAGNOSIS — E785 Hyperlipidemia, unspecified: Secondary | ICD-10-CM

## 2020-11-03 ENCOUNTER — Ambulatory Visit (INDEPENDENT_AMBULATORY_CARE_PROVIDER_SITE_OTHER): Payer: Medicare Other

## 2020-11-03 VITALS — Ht 62.5 in | Wt 210.0 lb

## 2020-11-03 DIAGNOSIS — Z0000 Encounter for general adult medical examination without abnormal findings: Secondary | ICD-10-CM | POA: Diagnosis not present

## 2020-11-03 NOTE — Progress Notes (Signed)
Subjective:   Mary Wolfe is a 70 y.o. female who presents for Medicare Annual (Subsequent) preventive examination.  Review of Systems    No ROS.  Medicare Wellness Virtual Visit.   Cardiac Risk Factors include: advanced age (>56men, >95 women)     Objective:    Today's Vitals   11/03/20 1217  Weight: 210 lb (95.3 kg)  Height: 5' 2.5" (1.588 m)   Body mass index is 37.8 kg/m.  Advanced Directives 11/03/2020 11/01/2019 04/16/2019 04/11/2019 06/25/2018  Does Patient Have a Medical Advance Directive? No No No No No  Does patient want to make changes to medical advance directive? - No - Patient declined - - -  Would patient like information on creating a medical advance directive? No - Patient declined - No - Patient declined No - Patient declined -    Current Medications (verified) Outpatient Encounter Medications as of 11/03/2020  Medication Sig  . amLODipine (NORVASC) 10 MG tablet TAKE 1 TABLET(10 MG) BY MOUTH DAILY  . cholecalciferol (VITAMIN D3) 25 MCG (1000 UT) tablet Take 1,000 Units by mouth daily.  . rosuvastatin (CRESTOR) 5 MG tablet TAKE 1 TABLET BY MOUTH THREE TIMES A WEEK ON MONDAY, WEDNESDAY, AND FRIDAY  . TURMERIC PO Take 900 mg by mouth daily.   . [DISCONTINUED] ezetimibe (ZETIA) 10 MG tablet Take 1 tablet (10 mg total) by mouth daily.   No facility-administered encounter medications on file as of 11/03/2020.    Allergies (verified) Nickel   History: Past Medical History:  Diagnosis Date  . Bursitis   . Diverticulitis   . Hyperlipidemia   . Hypertension   . Iron deficiency anemia   . OA (osteoarthritis)   . Obesity   . PONV (postoperative nausea and vomiting)    Past Surgical History:  Procedure Laterality Date  . CARPAL TUNNEL RELEASE Right   . CESAREAN SECTION    . COLONOSCOPY  03/14/2008  . COLONOSCOPY WITH PROPOFOL N/A 06/25/2018   Procedure: COLONOSCOPY WITH PROPOFOL;  Surgeon: Christena Deem, MD;  Location: [facility] ENDOSCOPY;  Service:  Endoscopy;  Laterality: N/A;  . ESOPHAGOGASTRODUODENOSCOPY  04/01/1997  . ESOPHAGOGASTRODUODENOSCOPY (EGD) WITH PROPOFOL N/A 06/25/2018   Procedure: ESOPHAGOGASTRODUODENOSCOPY (EGD) WITH PROPOFOL;  Surgeon: Christena Deem, MD;  Location: [facility] ENDOSCOPY;  Service: Endoscopy;  Laterality: N/A;  . removed glass from elbow     from accident  . TONSILLECTOMY    . TOTAL HIP ARTHROPLASTY Right 04/16/2019   Procedure: Right Anterior Total Hip Arthroplasty;  Surgeon: Marcene Corning, MD;  Location: [facility] ORS;  Service: Orthopedics;  Laterality: Right;   Family History  Problem Relation Age of Onset  . Heart disease Other   . Arthritis Other   . Breast cancer Maternal Aunt 24   Social History   Socioeconomic History  . Marital status: Married    Spouse name: Not on file  . Number of children: Not on file  . Years of education: Not on file  . Highest education level: Not on file  Occupational History  . Not on file  Tobacco Use  . Smoking status: Never Smoker  . Smokeless tobacco: Never Used  Vaping Use  . Vaping Use: Never used  Substance and Sexual Activity  . Alcohol use: No    Alcohol/week: 0.0 standard drinks  . Drug use: No  . Sexual activity: Yes    Birth control/protection: None  Other Topics Concern  . Not on file  Social History Narrative  . Not on file  Social Determinants of Health   Financial Resource Strain: Low Risk   . Difficulty of Paying Living Expenses: Not hard at all  Food Insecurity: No Food Insecurity  . Worried About Programme researcher, broadcasting/film/video in the Last Year: Never true  . Ran Out of Food in the Last Year: Never true  Transportation Needs: No Transportation Needs  . Lack of Transportation (Medical): No  . Lack of Transportation (Non-Medical): No  Physical Activity: Not on file  Stress: No Stress Concern Present  . Feeling of Stress : Not at all  Social Connections: Unknown  . Frequency of Communication with Friends and Family: More than three times a  week  . Frequency of Social Gatherings with Friends and Family: More than three times a week  . Attends Religious Services: Not on file  . Active Member of Clubs or Organizations: Not on file  . Attends Banker Meetings: Not on file  . Marital Status: Married    Tobacco Counseling Counseling given: Not Answered   Clinical Intake:  Pre-visit preparation completed: Yes        Diabetes: No  How often do you need to have someone help you when you read instructions, pamphlets, or other written materials from your doctor or pharmacy?: 1 - Never    Interpreter Needed?: No      Activities of Daily Living In your present state of health, do you have any difficulty performing the following activities: 11/03/2020  Hearing? N  Vision? N  Difficulty concentrating or making decisions? N  Walking or climbing stairs? N  Dressing or bathing? N  Doing errands, shopping? N  Preparing Food and eating ? N  Using the Toilet? N  In the past six months, have you accidently leaked urine? N  Do you have problems with loss of bowel control? N  Managing your Medications? N  Managing your Finances? N  Housekeeping or managing your Housekeeping? N  Some recent data might be hidden    Patient Care Team: Glori Luis, MD as PCP - General (Family Medicine)  Indicate any recent Medical Services you may have received from other than [facility] providers in the past year (date may be approximate).     Assessment:   This is a routine wellness examination for Mary Wolfe.  I connected with [name] today by telephone and verified that I am speaking with the correct person using two identifiers. Location patient: home Location provider: work Persons participating in the virtual visit: patient, Engineer, civil (consulting).    I discussed the limitations, risks, security and privacy concerns of performing an evaluation and management service by telephone and the availability of in person appointments. The  patient expressed understanding and verbally consented to this telephonic visit.    Interactive audio and video telecommunications were attempted between this provider and patient, however failed, due to patient having technical difficulties OR patient did not have access to video capability.  We continued and completed visit with audio only.  Some vital signs may be absent or patient reported.   Hearing/Vision screen  Hearing Screening   125Hz  250Hz  500Hz  1000Hz  2000Hz  3000Hz  4000Hz  6000Hz  8000Hz   Right ear:           Left ear:           Comments: Patient is able to hear conversational tones without difficulty.  No issues reported.  Vision Screening Comments: Followed by Dr. Wears corrective lenses Visual acuity not assessed, virtual visit.  They have seen their ophthalmologist  in the last 12 months.    Dietary issues and exercise activities discussed:    Healthy diet Fair water intake  Goals    . Increase physical activity     Walk for exercise  Lose weight      Depression Screen PHQ 2/9 Scores 11/03/2020 04/08/2020 11/01/2019 06/26/2019 10/18/2017  PHQ - 2 Score 0 0 0 0 0    Fall Risk Fall Risk  11/03/2020 04/08/2020 11/01/2019 06/26/2019 01/19/2018  Falls in the past year? 0 0 0 0 No  Number falls in past yr: 0 0 - 0 -  Injury with Fall? 0 - - - -  Follow up Falls evaluation completed Falls evaluation completed Falls evaluation completed Falls evaluation completed -    FALL RISK PREVENTION PERTAINING TO THE HOME: Handrails in use when climbing stairs? Yes Home free of loose throw rugs in walkways, pet beds, electrical cords, etc? Yes  Adequate lighting in your home to reduce risk of falls? Yes   ASSISTIVE DEVICES UTILIZED TO PREVENT FALLS: Life alert? No  Use of a cane, walker or w/c? No   TIMED UP AND GO: Was the test performed? No . Virtual visit.   Cognitive Function:   Patient is alert and oriented x3.  Denies difficulty focusing, making decisions, memory  loss.  Enjoys working and Therapist, art for Product/process development scientist.  MMSE/6CIT deferred. Normal by direct communication/observation.    6CIT Screen 11/01/2019  What Year? 0 points  What month? 0 points  What time? 0 points  Count back from 20 0 points  Months in reverse 0 points  Repeat phrase 0 points  Total Score 0    Immunizations Immunization History  Administered Date(s) Administered  . PFIZER(Purple Top)SARS-COV-2 Vaccination 12/27/2019, 01/17/2020  . Tdap 06/24/2016  . Zoster 06/24/2016   Health Maintenance Health Maintenance  Topic Date Due  . COVID-19 Vaccine (3 - Booster for Pfizer series) 07/19/2020  . MAMMOGRAM  04/15/2022  . TETANUS/TDAP  06/24/2026  . COLONOSCOPY (Pts 45-33yrs Insurance coverage will need to be confirmed)  06/25/2028  . DEXA SCAN  Completed  . Hepatitis C Screening  Completed  . HPV VACCINES  Aged Out  . INFLUENZA VACCINE  Discontinued  . PNA vac Low Risk Adult  Discontinued    Colorectal cancer screening: Type of screening: Colonoscopy. Completed 06/25/18. Repeat every 10 years  Mammogram status: Completed 04/15/20. Repeat every year  Lung Cancer Screening: (Low Dose CT Chest recommended if Age 43-80 years, 30 pack-year currently smoking OR have quit w/in 15years.) does not qualify.    Vision Screening: Recommended annual ophthalmology exams for early detection of glaucoma and other disorders of the eye. Is the patient up to date with their annual eye exam?  Yes   Dental Screening: Recommended annual dental exams for proper oral hygiene.  Community Resource Referral / Chronic Care Management: CRR required this visit?  No   CCM required this visit?  No      Plan:   Keep all routine maintenance appointments.   Follow up 03/03/21 @ 10:00  I have personally reviewed and noted the following in the patient's chart:   . Medical and social history . Use of alcohol, tobacco or illicit drugs  . Current medications and supplements . Functional  ability and status . Nutritional status . Physical activity . Advanced directives . List of other physicians . Hospitalizations, surgeries, and ER visits in previous 12 months . Vitals . Screenings to include cognitive, depression, and falls . Referrals and  appointments  In addition, I have reviewed and discussed with patient certain preventive protocols, quality metrics, and best practice recommendations. A written personalized care plan for preventive services as well as general preventive health recommendations were provided to patient via mychart.     Ashok PallOBrien-Blaney, Denisa L, LPN   1/6/10963/09/2020

## 2020-11-03 NOTE — Patient Instructions (Addendum)
Mary Wolfe , Thank you for taking time to come for your Medicare Wellness Visit. I appreciate your ongoing commitment to your health goals. Please review the following plan we discussed and let me know if I can assist you in the future.   These are the goals we discussed: Goals    . Increase physical activity     Walk for exercise  Lose weight       This is a list of the screening recommended for you and due dates:  Health Maintenance  Topic Date Due  . COVID-19 Vaccine (3 - Booster for Pfizer series) 07/19/2020  . Mammogram  04/15/2022  . Tetanus Vaccine  06/24/2026  . Colon Cancer Screening  06/25/2028  . DEXA scan (bone density measurement)  Completed  .  Hepatitis C: One time screening is recommended by Center for Disease Control  (CDC) for  adults born from [birth year] through 1963.   Completed  . HPV Vaccine  Aged Out  . Flu Shot  Discontinued  . Pneumonia vaccines  Discontinued    Immunizations Immunization History  Administered Date(s) Administered  . PFIZER(Purple Top)SARS-COV-2 Vaccination 12/27/2019, 01/17/2020  . Tdap 06/24/2016  . Zoster 06/24/2016   Keep all routine maintenance appointments.   Follow up 03/03/21 @ 10:00  Advanced directives: not yet completed  Conditions/risks identified: none new  Follow up in one year for your annual wellness visit    Preventive Care 65 Years and Older, Female Preventive care refers to lifestyle choices and visits with your health care provider that can promote health and wellness. What does preventive care include?  A yearly physical exam. This is also called an annual well check.  Dental exams once or twice a year.  Routine eye exams. Ask your health care provider how often you should have your eyes checked.  Personal lifestyle choices, including:  Daily care of your teeth and gums.  Regular physical activity.  Eating a healthy diet.  Avoiding tobacco and drug use.  Limiting alcohol use.  Practicing safe  sex.  Taking low-dose aspirin every day.  Taking vitamin and mineral supplements as recommended by your health care provider. What happens during an annual well check? The services and screenings done by your health care provider during your annual well check will depend on your age, overall health, lifestyle risk factors, and family history of disease. Counseling  Your health care provider may ask you questions about your:  Alcohol use.  Tobacco use.  Drug use.  Emotional well-being.  Home and relationship well-being.  Sexual activity.  Eating habits.  History of falls.  Memory and ability to understand (cognition).  Work and work Astronomer.  Reproductive health. Screening  You may have the following tests or measurements:  Height, weight, and BMI.  Blood pressure.  Lipid and cholesterol levels. These may be checked every 5 years, or more frequently if you are over 56 years old.  Skin check.  Lung cancer screening. You may have this screening every year starting at age 79 if you have a 30-pack-year history of smoking and currently smoke or have quit within the past 15 years.  Fecal occult blood test (FOBT) of the stool. You may have this test every year starting at age 56.  Flexible sigmoidoscopy or colonoscopy. You may have a sigmoidoscopy every 5 years or a colonoscopy every 10 years starting at age 24.  Hepatitis C blood test.  Hepatitis B blood test.  Sexually transmitted disease (STD) testing.  Diabetes screening. This  is done by checking your blood sugar (glucose) after you have not eaten for a while (fasting). You may have this done every 1-3 years.  Bone density scan. This is done to screen for osteoporosis. You may have this done starting at age 80.  Mammogram. This may be done every 1-2 years. Talk to your health care provider about how often you should have regular mammograms. Talk with your health care provider about your test results,  treatment options, and if necessary, the need for more tests. Vaccines  Your health care provider may recommend certain vaccines, such as:  Influenza vaccine. This is recommended every year.  Tetanus, diphtheria, and acellular pertussis (Tdap, Td) vaccine. You may need a Td booster every 10 years.  Zoster vaccine. You may need this after age 43.  Pneumococcal 13-valent conjugate (PCV13) vaccine. One dose is recommended after age 65.  Pneumococcal polysaccharide (PPSV23) vaccine. One dose is recommended after age 21. Talk to your health care provider about which screenings and vaccines you need and how often you need them. This information is not intended to replace advice given to you by your health care provider. Make sure you discuss any questions you have with your health care provider. Document Released: 09/18/2015 Document Revised: 05/11/2016 Document Reviewed: 06/23/2015 Elsevier Interactive Patient Education  2017 [place] Prevention in the Home Falls can cause injuries. They can happen to people of all ages. There are many things you can do to make your home safe and to help prevent falls. What can I do on the outside of my home?  Regularly fix the edges of walkways and driveways and fix any cracks.  Remove anything that might make you trip as you walk through a door, such as a raised step or threshold.  Trim any bushes or trees on the path to your home.  Use bright outdoor lighting.  Clear any walking paths of anything that might make someone trip, such as rocks or tools.  Regularly check to see if handrails are loose or broken. Make sure that both sides of any steps have handrails.  Any raised decks and porches should have guardrails on the edges.  Have any leaves, snow, or ice cleared regularly.  Use sand or salt on walking paths during winter.  Clean up any spills in your garage right away. This includes oil or grease spills. What can I do in the  bathroom?  Use night lights.  Install grab bars by the toilet and in the tub and shower. Do not use towel bars as grab bars.  Use non-skid mats or decals in the tub or shower.  If you need to sit down in the shower, use a plastic, non-slip stool.  Keep the floor dry. Clean up any water that spills on the floor as soon as it happens.  Remove soap buildup in the tub or shower regularly.  Attach bath mats securely with double-sided non-slip rug tape.  Do not have throw rugs and other things on the floor that can make you trip. What can I do in the bedroom?  Use night lights.  Make sure that you have a light by your bed that is easy to reach.  Do not use any sheets or blankets that are too big for your bed. They should not hang down onto the floor.  Have a firm chair that has side arms. You can use this for support while you get dressed.  Do not have throw rugs and other  things on the floor that can make you trip. What can I do in the kitchen?  Clean up any spills right away.  Avoid walking on wet floors.  Keep items that you use a lot in easy-to-reach places.  If you need to reach something above you, use a strong step stool that has a grab bar.  Keep electrical cords out of the way.  Do not use floor polish or wax that makes floors slippery. If you must use wax, use non-skid floor wax.  Do not have throw rugs and other things on the floor that can make you trip. What can I do with my stairs?  Do not leave any items on the stairs.  Make sure that there are handrails on both sides of the stairs and use them. Fix handrails that are broken or loose. Make sure that handrails are as long as the stairways.  Check any carpeting to make sure that it is firmly attached to the stairs. Fix any carpet that is loose or worn.  Avoid having throw rugs at the top or bottom of the stairs. If you do have throw rugs, attach them to the floor with carpet tape.  Make sure that you have a  light switch at the top of the stairs and the bottom of the stairs. If you do not have them, ask someone to add them for you. What else can I do to help prevent falls?  Wear shoes that:  Do not have high heels.  Have rubber bottoms.  Are comfortable and fit you well.  Are closed at the toe. Do not wear sandals.  If you use a stepladder:  Make sure that it is fully opened. Do not climb a closed stepladder.  Make sure that both sides of the stepladder are locked into place.  Ask someone to hold it for you, if possible.  Clearly mark and make sure that you can see:  Any grab bars or handrails.  First and last steps.  Where the edge of each step is.  Use tools that help you move around (mobility aids) if they are needed. These include:  Canes.  Walkers.  Scooters.  Crutches.  Turn on the lights when you go into a dark area. Replace any light bulbs as soon as they burn out.  Set up your furniture so you have a clear path. Avoid moving your furniture around.  If any of your floors are uneven, fix them.  If there are any pets around you, be aware of where they are.  Review your medicines with your doctor. Some medicines can make you feel dizzy. This can increase your chance of falling. Ask your doctor what other things that you can do to help prevent falls. This information is not intended to replace advice given to you by your health care provider. Make sure you discuss any questions you have with your health care provider. Document Released: 06/18/2009 Document Revised: 01/28/2016 Document Reviewed: 09/26/2014 Elsevier Interactive Patient Education  2017 [employer].

## 2020-11-27 ENCOUNTER — Other Ambulatory Visit: Payer: Self-pay | Admitting: Family Medicine

## 2020-11-27 DIAGNOSIS — E785 Hyperlipidemia, unspecified: Secondary | ICD-10-CM

## 2021-01-27 DIAGNOSIS — H40013 Open angle with borderline findings, low risk, bilateral: Secondary | ICD-10-CM | POA: Diagnosis not present

## 2021-01-27 DIAGNOSIS — H52223 Regular astigmatism, bilateral: Secondary | ICD-10-CM | POA: Diagnosis not present

## 2021-01-27 DIAGNOSIS — H35033 Hypertensive retinopathy, bilateral: Secondary | ICD-10-CM | POA: Diagnosis not present

## 2021-01-27 DIAGNOSIS — H35371 Puckering of macula, right eye: Secondary | ICD-10-CM | POA: Diagnosis not present

## 2021-01-27 DIAGNOSIS — H04123 Dry eye syndrome of bilateral lacrimal glands: Secondary | ICD-10-CM | POA: Diagnosis not present

## 2021-01-27 DIAGNOSIS — H2513 Age-related nuclear cataract, bilateral: Secondary | ICD-10-CM | POA: Diagnosis not present

## 2021-01-27 DIAGNOSIS — H524 Presbyopia: Secondary | ICD-10-CM | POA: Diagnosis not present

## 2021-01-27 DIAGNOSIS — H5213 Myopia, bilateral: Secondary | ICD-10-CM | POA: Diagnosis not present

## 2021-03-03 ENCOUNTER — Ambulatory Visit: Payer: TRICARE For Life (TFL) | Admitting: Family Medicine

## 2021-03-29 ENCOUNTER — Encounter: Payer: Self-pay | Admitting: Family Medicine

## 2021-03-29 ENCOUNTER — Other Ambulatory Visit: Payer: Self-pay

## 2021-03-29 ENCOUNTER — Ambulatory Visit (INDEPENDENT_AMBULATORY_CARE_PROVIDER_SITE_OTHER): Payer: Medicare Other | Admitting: Family Medicine

## 2021-03-29 DIAGNOSIS — Z6837 Body mass index (BMI) 37.0-37.9, adult: Secondary | ICD-10-CM

## 2021-03-29 DIAGNOSIS — R7303 Prediabetes: Secondary | ICD-10-CM

## 2021-03-29 DIAGNOSIS — I10 Essential (primary) hypertension: Secondary | ICD-10-CM

## 2021-03-29 DIAGNOSIS — R202 Paresthesia of skin: Secondary | ICD-10-CM

## 2021-03-29 DIAGNOSIS — E785 Hyperlipidemia, unspecified: Secondary | ICD-10-CM

## 2021-03-29 LAB — COMPREHENSIVE METABOLIC PANEL
ALT: 13 U/L (ref 0–35)
AST: 15 U/L (ref 0–37)
Albumin: 4.5 g/dL (ref 3.5–5.2)
Alkaline Phosphatase: 77 U/L (ref 39–117)
BUN: 14 mg/dL (ref 6–23)
CO2: 28 mEq/L (ref 19–32)
Calcium: 9.9 mg/dL (ref 8.4–10.5)
Chloride: 104 mEq/L (ref 96–112)
Creatinine, Ser: 0.81 mg/dL (ref 0.40–1.20)
GFR: 74.65 mL/min (ref >60.00)
Glucose, Bld: 98 mg/dL (ref 70–99)
Potassium: 4.1 mEq/L (ref 3.5–5.1)
Sodium: 141 mEq/L (ref 135–145)
Total Bilirubin: 0.7 mg/dL (ref 0.2–1.2)
Total Protein: 7.3 g/dL (ref 6.0–8.3)

## 2021-03-29 LAB — HEMOGLOBIN A1C: Hgb A1c MFr Bld: 6.3 % (ref 4.6–6.5)

## 2021-03-29 LAB — LDL CHOLESTEROL, DIRECT: Direct LDL: 134 mg/dL

## 2021-03-29 NOTE — Assessment & Plan Note (Signed)
Adequate control.  She will continue amlodipine 10 mg once daily.  Check labs.

## 2021-03-29 NOTE — Assessment & Plan Note (Signed)
Check LDL.  She will continue Crestor 5 mg 3 times a week.

## 2021-03-29 NOTE — Patient Instructions (Signed)
Nice to see you. We will check lab work today. Please attempt to lose weight as that may help with the tingling in your thigh.  Please also maintain a healthy activity level.

## 2021-03-29 NOTE — Progress Notes (Signed)
Mary Rumps, MD Phone: [phone]  DORAINE SCHEXNIDER is a 70 y.o. female who presents today for f/u.  HYPERTENSION Disease Monitoring Home BP Monitoring similar to today Chest pain- no    Dyspnea- no Medications Compliance-  taking amlodipine.   Edema- minimal at times, no change  HYPERLIPIDEMIA Symptoms Chest pain on exertion:  no    Medications: Compliance- taking crestor Right upper quadrant pain- no  Muscle aches- no, had to stop zetia due to myalgias  Leg tingling: Patient notes anterior left thigh tingling that typically only occurs when she lays down in bed at night and stretches her leg out.  If she moves her leg or bends it the tingling goes away.  Notes it is the anterior portion of her left thigh.  No back pain.  No pain in her legs.  No other tingling.  Notes this has been going on for quite some time.  Social History   Tobacco Use  Smoking Status Never  Smokeless Tobacco Never    Current Outpatient Medications on File Prior to Visit  Medication Sig Dispense Refill   amLODipine (NORVASC) 10 MG tablet TAKE 1 TABLET(10 MG) BY MOUTH DAILY 90 tablet 1   cholecalciferol (VITAMIN D3) 25 MCG (1000 UT) tablet Take 1,000 Units by mouth daily.     rosuvastatin (CRESTOR) 5 MG tablet TAKE 1 TABLET BY MOUTH 3 TIMES A WEEK(MONDAY, WEDNESDAY, FRIDAY) 13 tablet 1   TURMERIC PO Take 900 mg by mouth daily.      No current facility-administered medications on file prior to visit.     ROS see history of present illness  Objective  Physical Exam Vitals:   03/29/21 0928  BP: 120/70  Pulse: 98  Temp: 98 F (36.7 C)  SpO2: 98%    BP Readings from Last 3 Encounters:  03/29/21 120/70  04/08/20 115/80  12/25/19 120/80   Wt Readings from Last 3 Encounters:  03/29/21 220 lb 12.8 oz (100.2 kg)  11/03/20 210 lb (95.3 kg)  04/08/20 210 lb (95.3 kg)    Physical Exam Constitutional:      General: She is not in acute distress.    Appearance: She is not diaphoretic.   Cardiovascular:     Rate and Rhythm: Normal rate and regular rhythm.     Heart sounds: Normal heart sounds.  Pulmonary:     Effort: Pulmonary effort is normal.     Breath sounds: Normal breath sounds.  Musculoskeletal:     Comments: No midline spine tenderness, no midline spine step-off, no muscular back tenderness  Skin:    General: Skin is warm and dry.  Neurological:     Mental Status: She is alert.     Comments: 5/5 strength bilateral quads, hamstrings, plantar flexion, and dorsiflexion, sensation to light touch intact bilateral lower extremities     Assessment/Plan: Please see individual problem list.  Problem List Items Addressed This Visit     Essential hypertension    Adequate control.  She will continue amlodipine 10 mg once daily.  Check labs.       Hyperlipidemia    Check LDL.  She will continue Crestor 5 mg 3 times a week.       Relevant Orders   Comp Met (CMET)   Direct LDL   Left lateral thigh tingling    I suspect this is related to nerve impingement as it only occurs in certain positions.  Potentially this could be related to meralgia paresthetica.  Discussed trying to lose  weight and increasing her activity level with walking to see if that is helpful.  Advised that if she has any worsening symptoms or persistent symptoms she should let us know and we could have her see a specialist at that time.       Obesity    Patient notes she does an exercise video 3 times a week.  This is 12 minutes at a time.  She notes her diet has not been great recently.  I encouraged her to add walking 2 to 3 days a week to see if she can increase her exercise amount.  Discussed healthy diet and trying to limit fried fatty foods as well as sweets.       Prediabetes    Check A1c.       Relevant Orders   HgB A1c     Health Maintenance: I encouraged the patient to get the COVID booster though she has deferred this at this time.  I also discussed getting the Shingrix  vaccine and she notes she will think about this.  Return in about 6 months (around 09/29/2021) for Hypertension.  This visit occurred during the SARS-CoV-2 public health emergency.  Safety protocols were in place, including screening questions prior to the visit, additional usage of staff PPE, and extensive cleaning of exam room while observing appropriate contact time as indicated for disinfecting solutions.    Mary Rumps, MD [place]

## 2021-03-29 NOTE — Assessment & Plan Note (Addendum)
I suspect this is related to nerve impingement as it only occurs in certain positions.  Potentially this could be related to meralgia paresthetica.  Discussed trying to lose weight and increasing her activity level with walking to see if that is helpful.  Advised that if she has any worsening symptoms or persistent symptoms she should let us know and we could have her see a specialist at that time.

## 2021-03-29 NOTE — Assessment & Plan Note (Signed)
Check A1c. 

## 2021-03-29 NOTE — Assessment & Plan Note (Signed)
Patient notes she does an exercise video 3 times a week.  This is 12 minutes at a time.  She notes her diet has not been great recently.  I encouraged her to add walking 2 to 3 days a week to see if she can increase her exercise amount.  Discussed healthy diet and trying to limit fried fatty foods as well as sweets.

## 2021-04-04 ENCOUNTER — Other Ambulatory Visit: Payer: Self-pay | Admitting: Family Medicine

## 2021-04-04 DIAGNOSIS — E785 Hyperlipidemia, unspecified: Secondary | ICD-10-CM

## 2021-04-04 MED ORDER — ROSUVASTATIN CALCIUM 5 MG PO TABS: 5.0000 mg | ORAL_TABLET | Freq: Every day | ORAL | 1 refills | Status: DC

## 2021-05-12 ENCOUNTER — Other Ambulatory Visit: Payer: TRICARE For Life (TFL)

## 2021-05-17 ENCOUNTER — Other Ambulatory Visit: Payer: Self-pay

## 2021-05-17 ENCOUNTER — Other Ambulatory Visit (INDEPENDENT_AMBULATORY_CARE_PROVIDER_SITE_OTHER): Payer: Medicare Other

## 2021-05-17 ENCOUNTER — Other Ambulatory Visit: Payer: Self-pay | Admitting: Family Medicine

## 2021-05-17 DIAGNOSIS — E785 Hyperlipidemia, unspecified: Secondary | ICD-10-CM | POA: Diagnosis not present

## 2021-05-17 DIAGNOSIS — R7989 Other specified abnormal findings of blood chemistry: Secondary | ICD-10-CM

## 2021-05-17 LAB — HEPATIC FUNCTION PANEL
ALT: 37 U/L — ABNORMAL HIGH (ref 0–35)
AST: 29 U/L (ref 0–37)
Albumin: 4.2 g/dL (ref 3.5–5.2)
Alkaline Phosphatase: 71 U/L (ref 39–117)
Bilirubin, Direct: 0.1 mg/dL (ref 0.0–0.3)
Total Bilirubin: 0.8 mg/dL (ref 0.2–1.2)
Total Protein: 7 g/dL (ref 6.0–8.3)

## 2021-05-17 LAB — LDL CHOLESTEROL, DIRECT: Direct LDL: 95 mg/dL

## 2021-05-19 ENCOUNTER — Other Ambulatory Visit: Payer: TRICARE For Life (TFL)

## 2021-05-27 ENCOUNTER — Encounter: Payer: Self-pay | Admitting: Family Medicine

## 2021-05-31 ENCOUNTER — Other Ambulatory Visit (INDEPENDENT_AMBULATORY_CARE_PROVIDER_SITE_OTHER): Payer: Medicare Other

## 2021-05-31 ENCOUNTER — Other Ambulatory Visit: Payer: Self-pay

## 2021-05-31 DIAGNOSIS — R7989 Other specified abnormal findings of blood chemistry: Secondary | ICD-10-CM

## 2021-05-31 LAB — HEPATIC FUNCTION PANEL
ALT: 21 U/L (ref 0–35)
AST: 19 U/L (ref 0–37)
Albumin: 4.4 g/dL (ref 3.5–5.2)
Alkaline Phosphatase: 72 U/L (ref 39–117)
Bilirubin, Direct: 0.1 mg/dL (ref 0.0–0.3)
Total Bilirubin: 0.7 mg/dL (ref 0.2–1.2)
Total Protein: 7.2 g/dL (ref 6.0–8.3)

## 2021-07-08 ENCOUNTER — Other Ambulatory Visit: Payer: Self-pay | Admitting: Family Medicine

## 2021-08-09 DIAGNOSIS — H35033 Hypertensive retinopathy, bilateral: Secondary | ICD-10-CM | POA: Diagnosis not present

## 2021-08-09 DIAGNOSIS — H35371 Puckering of macula, right eye: Secondary | ICD-10-CM | POA: Diagnosis not present

## 2021-08-09 DIAGNOSIS — H40013 Open angle with borderline findings, low risk, bilateral: Secondary | ICD-10-CM | POA: Diagnosis not present

## 2021-08-09 DIAGNOSIS — H04123 Dry eye syndrome of bilateral lacrimal glands: Secondary | ICD-10-CM | POA: Diagnosis not present

## 2021-08-09 DIAGNOSIS — H2513 Age-related nuclear cataract, bilateral: Secondary | ICD-10-CM | POA: Diagnosis not present

## 2021-09-29 ENCOUNTER — Ambulatory Visit: Payer: TRICARE For Life (TFL) | Admitting: Family Medicine

## 2021-11-03 ENCOUNTER — Other Ambulatory Visit: Payer: Self-pay | Admitting: Family Medicine

## 2021-11-03 DIAGNOSIS — E785 Hyperlipidemia, unspecified: Secondary | ICD-10-CM

## 2021-11-04 ENCOUNTER — Ambulatory Visit: Payer: TRICARE For Life (TFL)

## 2021-11-10 ENCOUNTER — Ambulatory Visit (INDEPENDENT_AMBULATORY_CARE_PROVIDER_SITE_OTHER): Payer: Medicare Other

## 2021-11-10 VITALS — Ht 62.5 in | Wt 220.0 lb

## 2021-11-10 DIAGNOSIS — Z0000 Encounter for general adult medical examination without abnormal findings: Secondary | ICD-10-CM | POA: Diagnosis not present

## 2021-11-10 NOTE — Patient Instructions (Addendum)
Mary Wolfe , Thank you for taking time to come for your Medicare Wellness Visit. I appreciate your ongoing commitment to your health goals. Please review the following plan we discussed and let me know if I can assist you in the future.   These are the goals we discussed:  Goals      Increase physical activity     Walk for exercise. Lose weight.        This is a list of the screening recommended for you and due dates:  Health Maintenance  Topic Date Due   COVID-19 Vaccine (3 - Booster for Pfizer series) 11/26/2021*   Mammogram  04/15/2022   Tetanus Vaccine  06/24/2026   Colon Cancer Screening  06/25/2028   DEXA scan (bone density measurement)  Completed   Hepatitis C Screening: USPSTF Recommendation to screen - Ages 77-79 yo.  Completed   HPV Vaccine  Aged Out   Pneumonia Vaccine  Discontinued   Flu Shot  Discontinued   Zoster (Shingles) Vaccine  Discontinued  *Topic was postponed. The date shown is not the original due date.    Advanced directives: not yet completed  Conditions/risks identified: none new  Next appointment: Follow up in one year for your annual wellness visit    Preventive Care 65 Years and Older, Female Preventive care refers to lifestyle choices and visits with your health care provider that can promote health and wellness. What does preventive care include? A yearly physical exam. This is also called an annual well check. Dental exams once or twice a year. Routine eye exams. Ask your health care provider how often you should have your eyes checked. Personal lifestyle choices, including: Daily care of your teeth and gums. Regular physical activity. Eating a healthy diet. Avoiding tobacco and drug use. Limiting alcohol use. Practicing safe sex. Taking low-dose aspirin every day. Taking vitamin and mineral supplements as recommended by your health care provider. What happens during an annual well check? The services and screenings done by your health  care provider during your annual well check will depend on your age, overall health, lifestyle risk factors, and family history of disease. Counseling  Your health care provider may ask you questions about your: Alcohol use. Tobacco use. Drug use. Emotional well-being. Home and relationship well-being. Sexual activity. Eating habits. History of falls. Memory and ability to understand (cognition). Work and work Astronomer. Reproductive health. Screening  You may have the following tests or measurements: Height, weight, and BMI. Blood pressure. Lipid and cholesterol levels. These may be checked every 5 years, or more frequently if you are over 45 years old. Skin check. Lung cancer screening. You may have this screening every year starting at age 84 if you have a 30-pack-year history of smoking and currently smoke or have quit within the past 15 years. Fecal occult blood test (FOBT) of the stool. You may have this test every year starting at age 67. Flexible sigmoidoscopy or colonoscopy. You may have a sigmoidoscopy every 5 years or a colonoscopy every 10 years starting at age 7. Hepatitis C blood test. Hepatitis B blood test. Sexually transmitted disease (STD) testing. Diabetes screening. This is done by checking your blood sugar (glucose) after you have not eaten for a while (fasting). You may have this done every 1-3 years. Bone density scan. This is done to screen for osteoporosis. You may have this done starting at age 2. Mammogram. This may be done every 1-2 years. Talk to your health care provider about how  often you should have regular mammograms. Talk with your health care provider about your test results, treatment options, and if necessary, the need for more tests. Vaccines  Your health care provider may recommend certain vaccines, such as: Influenza vaccine. This is recommended every year. Tetanus, diphtheria, and acellular pertussis (Tdap, Td) vaccine. You may need a Td  booster every 10 years. Zoster vaccine. You may need this after age 19. Pneumococcal 13-valent conjugate (PCV13) vaccine. One dose is recommended after age 35. Pneumococcal polysaccharide (PPSV23) vaccine. One dose is recommended after age 32. Talk to your health care provider about which screenings and vaccines you need and how often you need them. This information is not intended to replace advice given to you by your health care provider. Make sure you discuss any questions you have with your health care provider. Document Released: 09/18/2015 Document Revised: 05/11/2016 Document Reviewed: 06/23/2015 Elsevier Interactive Patient Education  2017 [place] Prevention in the Home Falls can cause injuries. They can happen to people of all ages. There are many things you can do to make your home safe and to help prevent falls. What can I do on the outside of my home? Regularly fix the edges of walkways and driveways and fix any cracks. Remove anything that might make you trip as you walk through a door, such as a raised step or threshold. Trim any bushes or trees on the path to your home. Use bright outdoor lighting. Clear any walking paths of anything that might make someone trip, such as rocks or tools. Regularly check to see if handrails are loose or broken. Make sure that both sides of any steps have handrails. Any raised decks and porches should have guardrails on the edges. Have any leaves, snow, or ice cleared regularly. Use sand or salt on walking paths during winter. Clean up any spills in your garage right away. This includes oil or grease spills. What can I do in the bathroom? Use night lights. Install grab bars by the toilet and in the tub and shower. Do not use towel bars as grab bars. Use non-skid mats or decals in the tub or shower. If you need to sit down in the shower, use a plastic, non-slip stool. Keep the floor dry. Clean up any water that spills on the floor  as soon as it happens. Remove soap buildup in the tub or shower regularly. Attach bath mats securely with double-sided non-slip rug tape. Do not have throw rugs and other things on the floor that can make you trip. What can I do in the bedroom? Use night lights. Make sure that you have a light by your bed that is easy to reach. Do not use any sheets or blankets that are too big for your bed. They should not hang down onto the floor. Have a firm chair that has side arms. You can use this for support while you get dressed. Do not have throw rugs and other things on the floor that can make you trip. What can I do in the kitchen? Clean up any spills right away. Avoid walking on wet floors. Keep items that you use a lot in easy-to-reach places. If you need to reach something above you, use a strong step stool that has a grab bar. Keep electrical cords out of the way. Do not use floor polish or wax that makes floors slippery. If you must use wax, use non-skid floor wax. Do not have throw rugs and other things  on the floor that can make you trip. What can I do with my stairs? Do not leave any items on the stairs. Make sure that there are handrails on both sides of the stairs and use them. Fix handrails that are broken or loose. Make sure that handrails are as long as the stairways. Check any carpeting to make sure that it is firmly attached to the stairs. Fix any carpet that is loose or worn. Avoid having throw rugs at the top or bottom of the stairs. If you do have throw rugs, attach them to the floor with carpet tape. Make sure that you have a light switch at the top of the stairs and the bottom of the stairs. If you do not have them, ask someone to add them for you. What else can I do to help prevent falls? Wear shoes that: Do not have high heels. Have rubber bottoms. Are comfortable and fit you well. Are closed at the toe. Do not wear sandals. If you use a stepladder: Make sure that it is  fully opened. Do not climb a closed stepladder. Make sure that both sides of the stepladder are locked into place. Ask someone to hold it for you, if possible. Clearly mark and make sure that you can see: Any grab bars or handrails. First and last steps. Where the edge of each step is. Use tools that help you move around (mobility aids) if they are needed. These include: Canes. Walkers. Scooters. Crutches. Turn on the lights when you go into a dark area. Replace any light bulbs as soon as they burn out. Set up your furniture so you have a clear path. Avoid moving your furniture around. If any of your floors are uneven, fix them. If there are any pets around you, be aware of where they are. Review your medicines with your doctor. Some medicines can make you feel dizzy. This can increase your chance of falling. Ask your doctor what other things that you can do to help prevent falls. This information is not intended to replace advice given to you by your health care provider. Make sure you discuss any questions you have with your health care provider. Document Released: 06/18/2009 Document Revised: 01/28/2016 Document Reviewed: 09/26/2014 Elsevier Interactive Patient Education  2017 [employer].

## 2021-11-10 NOTE — Progress Notes (Signed)
Subjective:   Mary Wolfe is a 71 y.o. female who presents for Medicare Annual (Subsequent) preventive examination.  Review of Systems    No ROS.  Medicare Wellness Virtual Visit.  Visual/audio telehealth visit, UTA vital signs.   See social history for additional risk factors.   Cardiac Risk Factors include: advanced age (>52men, >47 women)     Objective:    Today's Vitals   11/10/21 1450  Weight: 220 lb (99.8 kg)  Height: 5' 2.5" (1.588 m)   Body mass index is 39.6 kg/m.  Advanced Directives 11/10/2021 11/03/2020 11/01/2019 04/16/2019 04/11/2019 06/25/2018  Does Patient Have a Medical Advance Directive? No No No No No No  Does patient want to make changes to medical advance directive? - - No - Patient declined - - -  Would patient like information on creating a medical advance directive? No - Guardian declined No - Patient declined - No - Patient declined No - Patient declined -   Current Medications (verified) Outpatient Encounter Medications as of 11/10/2021  Medication Sig   amLODipine (NORVASC) 10 MG tablet TAKE 1 TABLET(10 MG) BY MOUTH DAILY   cholecalciferol (VITAMIN D3) 25 MCG (1000 UT) tablet Take 1,000 Units by mouth daily.   rosuvastatin (CRESTOR) 5 MG tablet TAKE 1 TABLET(5 MG) BY MOUTH DAILY (Patient not taking: Reported on 11/10/2021)   TURMERIC PO Take 900 mg by mouth daily.    No facility-administered encounter medications on file as of 11/10/2021.    Allergies (verified) Nickel   History: Past Medical History:  Diagnosis Date   Bursitis    Diverticulitis    Hyperlipidemia    Hypertension    Iron deficiency anemia    OA (osteoarthritis)    Obesity    PONV (postoperative nausea and vomiting)    Past Surgical History:  Procedure Laterality Date   CARPAL TUNNEL RELEASE Right    CESAREAN SECTION     COLONOSCOPY  03/14/2008   COLONOSCOPY WITH PROPOFOL N/A 06/25/2018   Procedure: COLONOSCOPY WITH PROPOFOL;  Surgeon: Christena Deem, MD;  Location: [facility]  ENDOSCOPY;  Service: Endoscopy;  Laterality: N/A;   ESOPHAGOGASTRODUODENOSCOPY  04/01/1997   ESOPHAGOGASTRODUODENOSCOPY (EGD) WITH PROPOFOL N/A 06/25/2018   Procedure: ESOPHAGOGASTRODUODENOSCOPY (EGD) WITH PROPOFOL;  Surgeon: Christena Deem, MD;  Location: [facility] ENDOSCOPY;  Service: Endoscopy;  Laterality: N/A;   removed glass from elbow     from accident   TONSILLECTOMY     TOTAL HIP ARTHROPLASTY Right 04/16/2019   Procedure: Right Anterior Total Hip Arthroplasty;  Surgeon: Marcene Corning, MD;  Location: [facility] ORS;  Service: Orthopedics;  Laterality: Right;   Family History  Problem Relation Age of Onset   Heart disease Other    Arthritis Other    Breast cancer Maternal Aunt 12   Social History   Socioeconomic History   Marital status: Married    Spouse name: Not on file   Number of children: Not on file   Years of education: Not on file   Highest education level: Not on file  Occupational History   Not on file  Tobacco Use   Smoking status: Never   Smokeless tobacco: Never  Vaping Use   Vaping Use: Never used  Substance and Sexual Activity   Alcohol use: No    Alcohol/week: 0.0 standard drinks   Drug use: No   Sexual activity: Yes    Birth control/protection: None  Other Topics Concern   Not on file  Social History Narrative   Not on  file   Social Determinants of Health   Financial Resource Strain: Not on file  Food Insecurity: Not on file  Transportation Needs: Not on file  Physical Activity: Not on file  Stress: Not on file  Social Connections: Not on file    Tobacco Counseling Counseling given: Not Answered   Clinical Intake:  Pre-visit preparation completed: Yes        Diabetes: No  How often do you need to have someone help you when you read instructions, pamphlets, or other written materials from your doctor or pharmacy?: 1 - Never  Interpreter Needed?: No      Activities of Daily Living In your present state of health, do you have any  difficulty performing the following activities: 11/10/2021  Hearing? N  Vision? N  Difficulty concentrating or making decisions? N  Walking or climbing stairs? N  Dressing or bathing? N  Doing errands, shopping? N  Preparing Food and eating ? N  Using the Toilet? N  In the past six months, have you accidently leaked urine? N  Do you have problems with loss of bowel control? N  Managing your Medications? N  Managing your Finances? N  Housekeeping or managing your Housekeeping? N  Some recent data might be hidden   Patient Care Team: Glori LuisSonnenberg, Eric G, MD as PCP - General (Family Medicine)  Indicate any recent Medical Services you may have received from other than [facility] providers in the past year (date may be approximate).     Assessment:   This is a routine wellness examination for Mary Wolfe.  Virtual Visit via Telephone Note  I connected with  Mary Wolfe on 11/10/21 at  2:45 PM EST by telephone and verified that I am speaking with the correct person using two identifiers.  Persons participating in the virtual visit: patient/Nurse Health Advisor   I discussed the limitations, risks, security and privacy concerns of performing an evaluation and management service by telephone and the availability of in person appointments. The patient expressed understanding and agreed to proceed.  Interactive audio and video telecommunications were attempted between this nurse and patient, however failed, due to patient having technical difficulties OR patient did not have access to video capability.  We continued and completed visit with audio only.  Some vital signs may be absent or patient reported.   Hearing/Vision screen Hearing Screening - Comments:: Patient is able to hear conversational tones without difficulty.  No issues reported. Vision Screening - Comments:: Followed by Dr. Larence PenningNice  Wears corrective lenses They have seen their ophthalmologist in the last 12 months  Dietary issues and  exercise activities discussed: Current Exercise Habits: Home exercise routine, Type of exercise: strength training/weights;walking, Time (Minutes): 10, Frequency (Times/Week): 3, Weekly Exercise (Minutes/Week): 30, Intensity: Mild Healthy diet Good water intake   Goals Addressed             This Visit's Progress    Increase physical activity       Walk for exercise. Lose weight.       Depression Screen PHQ 2/9 Scores 11/10/2021 03/29/2021 11/03/2020 04/08/2020 11/01/2019 06/26/2019 10/18/2017  PHQ - 2 Score 0 0 0 0 0 0 0    Fall Risk Fall Risk  11/10/2021 03/29/2021 11/03/2020 04/08/2020 11/01/2019  Falls in the past year? 0 0 0 0 0  Number falls in past yr: 0 0 0 0 -  Injury with Fall? - - 0 - -  Follow up Falls evaluation completed Falls evaluation completed Falls evaluation  completed Falls evaluation completed Falls evaluation completed    FALL RISK PREVENTION PERTAINING TO THE HOME: Home free of loose throw rugs in walkways, pet beds, electrical cords, etc? Yes  Adequate lighting in your home to reduce risk of falls? Yes   ASSISTIVE DEVICES UTILIZED TO PREVENT FALLS:  Life alert? No  Use of a cane, walker or w/c? No   TIMED UP AND GO:  Was the test performed? No .   Cognitive Function:  Patient is alert and oriented x3.    6CIT Screen 11/10/2021 11/01/2019  What Year? 0 points 0 points  What month? 0 points 0 points  What time? - 0 points  Count back from 20 - 0 points  Months in reverse 0 points 0 points  Repeat phrase - 0 points  Total Score - 0   Immunizations Immunization History  Administered Date(s) Administered   PFIZER(Purple Top)SARS-COV-2 Vaccination 12/27/2019, 01/17/2020   Tdap 06/24/2016   Zoster, Live 06/24/2016    Health maintenance vaccines discontinued per patient.   Screening Tests Health Maintenance  Topic Date Due   COVID-19 Vaccine (3 - Booster for Pfizer series) 11/26/2021 (Originally 03/13/2020)   MAMMOGRAM  04/15/2022   TETANUS/TDAP   06/24/2026   COLONOSCOPY (Pts 45-53yrs Insurance coverage will need to be confirmed)  06/25/2028   DEXA SCAN  Completed   Hepatitis C Screening  Completed   HPV VACCINES  Aged Out   Pneumonia Vaccine 24+ Years old  Discontinued   INFLUENZA VACCINE  Discontinued   Zoster Vaccines- Shingrix  Discontinued   Health Maintenance There are no preventive care reminders to display for this patient.   Mammogram- schedules every 2 years. Next due 04/2022   Lung Cancer Screening: (Low Dose CT Chest recommended if Age 61-80 years, 30 pack-year currently smoking OR have quit w/in 15years.) does not qualify.   Vision Screening: Recommended annual ophthalmology exams for early detection of glaucoma and other disorders of the eye.  Dental Screening: Recommended annual dental exams for proper oral hygiene  Community Resource Referral / Chronic Care Management: CRR required this visit?  No   CCM required this visit?  No      Plan:   Keep all routine maintenance appointments.   I have personally reviewed and noted the following in the patients chart:   Medical and social history Use of alcohol, tobacco or illicit drugs  Current medications and supplements including opioid prescriptions.  Functional ability and status Nutritional status Physical activity Advanced directives List of other physicians Hospitalizations, surgeries, and ER visits in previous 12 months Vitals Screenings to include cognitive, depression, and falls Referrals and appointments  In addition, I have reviewed and discussed with patient certain preventive protocols, quality metrics, and best practice recommendations. A written personalized care plan for preventive services as well as general preventive health recommendations were provided to patient.     Ashok Pall, LPN   0/10/6376

## 2022-02-02 ENCOUNTER — Other Ambulatory Visit: Payer: Self-pay | Admitting: Family Medicine

## 2022-02-16 ENCOUNTER — Encounter: Payer: Self-pay | Admitting: Family Medicine

## 2022-02-16 ENCOUNTER — Ambulatory Visit (INDEPENDENT_AMBULATORY_CARE_PROVIDER_SITE_OTHER): Payer: Medicare Other | Admitting: Family Medicine

## 2022-02-16 VITALS — BP 125/80 | HR 98 | Temp 97.9°F | Ht 62.5 in | Wt 217.0 lb

## 2022-02-16 DIAGNOSIS — R7303 Prediabetes: Secondary | ICD-10-CM

## 2022-02-16 DIAGNOSIS — E785 Hyperlipidemia, unspecified: Secondary | ICD-10-CM | POA: Diagnosis not present

## 2022-02-16 DIAGNOSIS — I10 Essential (primary) hypertension: Secondary | ICD-10-CM | POA: Diagnosis not present

## 2022-02-16 LAB — COMPREHENSIVE METABOLIC PANEL
ALT: 13 U/L (ref 0–35)
AST: 17 U/L (ref 0–37)
Albumin: 4.5 g/dL (ref 3.5–5.2)
Alkaline Phosphatase: 83 U/L (ref 39–117)
BUN: 14 mg/dL (ref 6–23)
CO2: 27 mEq/L (ref 19–32)
Calcium: 9.8 mg/dL (ref 8.4–10.5)
Chloride: 104 mEq/L (ref 96–112)
Creatinine, Ser: 0.8 mg/dL (ref 0.40–1.20)
GFR: 75.3 mL/min (ref >60.00)
Glucose, Bld: 111 mg/dL — ABNORMAL HIGH (ref 70–99)
Potassium: 4.4 mEq/L (ref 3.5–5.1)
Sodium: 141 mEq/L (ref 135–145)
Total Bilirubin: 0.7 mg/dL (ref 0.2–1.2)
Total Protein: 7.5 g/dL (ref 6.0–8.3)

## 2022-02-16 LAB — LDL CHOLESTEROL, DIRECT: Direct LDL: 185 mg/dL

## 2022-02-16 LAB — HEMOGLOBIN A1C: Hgb A1c MFr Bld: 6.2 % (ref 4.6–6.5)

## 2022-02-16 NOTE — Assessment & Plan Note (Signed)
Adequate control.  She will continue amlodipine 10 mg once daily. °

## 2022-02-16 NOTE — Patient Instructions (Signed)
Nice to see you. Please get back to exercising. Please continue healthy diet.

## 2022-02-16 NOTE — Assessment & Plan Note (Signed)
We will check her LDL.  She has been intolerant to multiple statins and Zetia.  She does not meet criteria for PCSK9 inhibitor.  We will continue to monitor and she will continue to work on diet and exercise.

## 2022-02-16 NOTE — Assessment & Plan Note (Signed)
Check A1c.  She will continue diet and exercise. 

## 2022-02-16 NOTE — Progress Notes (Signed)
Tommi Rumps, MD Phone: [phone]  Mary Wolfe is a 71 y.o. female who presents today for f/u.  HYPERTENSION Disease Monitoring Home BP Monitoring similar to today Chest pain- no    Dyspnea- no Medications Compliance-  taking amlodipine.   Edema- no BMET    Component Value Date/Time   NA 141 03/29/2021 0956   K 4.1 03/29/2021 0956   CL 104 03/29/2021 0956   CO2 28 03/29/2021 0956   GLUCOSE 98 03/29/2021 0956   BUN 14 03/29/2021 0956   CREATININE 0.81 03/29/2021 0956   CREATININE 0.89 06/24/2016 1624   CALCIUM 9.9 03/29/2021 0956   GFRNONAA >60 04/11/2019 1511   GFRAA >60 04/11/2019 1511   Prediabetes: No polyuria or polydipsia.  She has been eating mostly salads.  She limit sweets.  She has occasional fried foods.  No soda or sweet tea.  She had been exercising 12 minutes 3-4 times a week though her brother passed away earlier this year and she has gotten away from exercising.  Patient lost her brother after a 93-monthlong illness.  She notes no depression.  She notes she is coping adequately with this.  She has support at home.  Hyperlipidemia: Patient had to stop Crestor given tingling in her legs.  The tingling resolved with stopping the Crestor.  The 10-year ASCVD risk score (Arnett DK, et al., 2019) is: 9.4%   Values used to calculate the score:     Age: [age]years     Sex: Female     Is Non-Hispanic African American: Yes     Diabetic: No     Tobacco smoker: No     Systolic Blood Pressure: 1253mmHg     Is BP treated: Yes     HDL Cholesterol: 50.9 mg/dL     Total Cholesterol: 170 mg/dL   Social History   Tobacco Use  Smoking Status Never  Smokeless Tobacco Never    Current Outpatient Medications on File Prior to Visit  Medication Sig Dispense Refill   amLODipine (NORVASC) 10 MG tablet TAKE 1 TABLET(10 MG) BY MOUTH DAILY 90 tablet 1   cholecalciferol (VITAMIN D3) 25 MCG (1000 UT) tablet Take 1,000 Units by mouth daily.     No current  facility-administered medications on file prior to visit.     ROS see history of present illness  Objective  Physical Exam Vitals:   02/16/22 0853  BP: 125/80  Pulse: 98  Temp: 97.9 F (36.6 C)  SpO2: 99%    BP Readings from Last 3 Encounters:  02/16/22 125/80  03/29/21 120/70  04/08/20 115/80   Wt Readings from Last 3 Encounters:  02/16/22 217 lb (98.4 kg)  11/10/21 220 lb (99.8 kg)  03/29/21 220 lb 12.8 oz (100.2 kg)    Physical Exam Constitutional:      General: She is not in acute distress.    Appearance: She is not diaphoretic.  Cardiovascular:     Rate and Rhythm: Normal rate and regular rhythm.     Heart sounds: Normal heart sounds.  Pulmonary:     Effort: Pulmonary effort is normal.     Breath sounds: Normal breath sounds.  Musculoskeletal:     Right lower leg: No edema.     Left lower leg: No edema.  Skin:    General: Skin is warm and dry.  Neurological:     Mental Status: She is alert.      Assessment/Plan: Please see individual problem list.  Problem List Items  Addressed This Visit     Essential hypertension (Chronic)    Adequate control.  She will continue amlodipine 10 mg once daily.      Relevant Orders   Comp Met (CMET)   Hyperlipidemia - Primary (Chronic)    We will check her LDL.  She has been intolerant to multiple statins and Zetia.  She does not meet criteria for PCSK9 inhibitor.  We will continue to monitor and she will continue to work on diet and exercise.      Relevant Orders   Comp Met (CMET)   Direct LDL   Prediabetes (Chronic)    Check A1c.  She will continue diet and exercise.      Relevant Orders   HgB A1c   Offered support.  The patient will let us know if she develops any depression following the loss of her brother.  Return in about 6 months (around 08/18/2022) for Hypertension.   Tommi Rumps, MD [place]

## 2022-02-18 ENCOUNTER — Telehealth: Payer: Self-pay | Admitting: Family Medicine

## 2022-02-18 NOTE — Telephone Encounter (Signed)
Patient returned office phone for lab results. °

## 2022-02-18 NOTE — Telephone Encounter (Signed)
Pt called and notified of lab results with no further questions.

## 2022-03-16 DIAGNOSIS — H2513 Age-related nuclear cataract, bilateral: Secondary | ICD-10-CM | POA: Diagnosis not present

## 2022-03-16 DIAGNOSIS — H35371 Puckering of macula, right eye: Secondary | ICD-10-CM | POA: Diagnosis not present

## 2022-03-16 DIAGNOSIS — H04123 Dry eye syndrome of bilateral lacrimal glands: Secondary | ICD-10-CM | POA: Diagnosis not present

## 2022-03-16 DIAGNOSIS — H40013 Open angle with borderline findings, low risk, bilateral: Secondary | ICD-10-CM | POA: Diagnosis not present

## 2022-03-16 DIAGNOSIS — H35033 Hypertensive retinopathy, bilateral: Secondary | ICD-10-CM | POA: Diagnosis not present

## 2022-08-24 ENCOUNTER — Ambulatory Visit: Payer: Medicare Other | Admitting: Family Medicine

## 2022-09-07 ENCOUNTER — Other Ambulatory Visit: Payer: Self-pay | Admitting: Family Medicine

## 2022-09-19 DIAGNOSIS — S60112A Contusion of left thumb with damage to nail, initial encounter: Secondary | ICD-10-CM | POA: Diagnosis not present

## 2022-09-21 DIAGNOSIS — H2513 Age-related nuclear cataract, bilateral: Secondary | ICD-10-CM | POA: Diagnosis not present

## 2022-09-21 DIAGNOSIS — H35033 Hypertensive retinopathy, bilateral: Secondary | ICD-10-CM | POA: Diagnosis not present

## 2022-09-21 DIAGNOSIS — H35371 Puckering of macula, right eye: Secondary | ICD-10-CM | POA: Diagnosis not present

## 2022-09-21 DIAGNOSIS — H04123 Dry eye syndrome of bilateral lacrimal glands: Secondary | ICD-10-CM | POA: Diagnosis not present

## 2022-09-21 DIAGNOSIS — H40013 Open angle with borderline findings, low risk, bilateral: Secondary | ICD-10-CM | POA: Diagnosis not present

## 2022-09-28 ENCOUNTER — Ambulatory Visit: Payer: Medicare Other | Admitting: Family

## 2022-10-04 DIAGNOSIS — S60112D Contusion of left thumb with damage to nail, subsequent encounter: Secondary | ICD-10-CM | POA: Diagnosis not present

## 2022-10-04 DIAGNOSIS — S6702XD Crushing injury of left thumb, subsequent encounter: Secondary | ICD-10-CM | POA: Diagnosis not present

## 2022-11-07 ENCOUNTER — Telehealth: Payer: Self-pay | Admitting: Family Medicine

## 2022-11-07 NOTE — Telephone Encounter (Signed)
Contacted Mary Wolfe to schedule their annual wellness visit. Appointment made for 11/14/2022.  Thank you,  [place] Direct dial  [phone]

## 2022-11-14 ENCOUNTER — Ambulatory Visit (INDEPENDENT_AMBULATORY_CARE_PROVIDER_SITE_OTHER): Payer: Medicare PPO

## 2022-11-14 VITALS — Ht 62.5 in | Wt 217.0 lb

## 2022-11-14 DIAGNOSIS — Z0000 Encounter for general adult medical examination without abnormal findings: Secondary | ICD-10-CM | POA: Diagnosis not present

## 2022-11-14 NOTE — Progress Notes (Signed)
Subjective:   Mary Wolfe is a 72 y.o. female who presents for Medicare Annual (Subsequent) preventive examination.  Review of Systems    No ROS.  Medicare Wellness Virtual Visit.  Visual/audio telehealth visit, UTA vital signs.   See social history for additional risk factors.   Cardiac Risk Factors include: advanced age (>45mn, >>77women);hypertension     Objective:    Today's Vitals   11/14/22 1338  Weight: 217 lb (98.4 kg)  Height: 5' 2.5" (1.588 m)   Body mass index is 39.06 kg/m.     11/14/2022    1:45 PM 11/10/2021    3:05 PM 11/03/2020   11:20 AM 11/01/2019   11:09 AM 04/16/2019   10:47 AM 04/11/2019    2:27 PM 06/25/2018    7:58 AM  Advanced Directives  Does Patient Have a Medical Advance Directive? No No No No No No No  Does patient want to make changes to medical advance directive?    No - Patient declined     Would patient like information on creating a medical advance directive? No - Patient declined No - Guardian declined No - Patient declined  No - Patient declined No - Patient declined     Current Medications (verified) Outpatient Encounter Medications as of 11/14/2022  Medication Sig   amLODipine (NORVASC) 10 MG tablet TAKE 1 TABLET(10 MG) BY MOUTH DAILY   cholecalciferol (VITAMIN D3) 25 MCG (1000 UT) tablet Take 1,000 Units by mouth daily.   No facility-administered encounter medications on file as of 11/14/2022.    Allergies (verified) Nickel   History: Past Medical History:  Diagnosis Date   Bursitis    Diverticulitis    Hyperlipidemia    Hypertension    Iron deficiency anemia    OA (osteoarthritis)    Obesity    PONV (postoperative nausea and vomiting)    Past Surgical History:  Procedure Laterality Date   CARPAL TUNNEL RELEASE Right    CESAREAN SECTION     COLONOSCOPY  03/14/2008   COLONOSCOPY WITH PROPOFOL N/A 06/25/2018   Procedure: COLONOSCOPY WITH PROPOFOL;  Surgeon: SLollie Sails MD;  [facility];  Service:  Endoscopy;  Laterality: N/A;   ESOPHAGOGASTRODUODENOSCOPY  04/01/1997   ESOPHAGOGASTRODUODENOSCOPY (EGD) WITH PROPOFOL N/A 06/25/2018   Procedure: ESOPHAGOGASTRODUODENOSCOPY (EGD) WITH PROPOFOL;  Surgeon: SLollie Sails MD;  [facility];  Service: Endoscopy;  Laterality: N/A;   removed glass from elbow     from accident   TSan SimonRight 04/16/2019   Procedure: Right Anterior Total Hip Arthroplasty;  Surgeon: DMelrose Nakayama MD;  Location: [facility];  Service: Orthopedics;  Laterality: Right;   Family History  Problem Relation Age of Onset   Heart disease Other    Arthritis Other    Breast cancer Maternal Aunt 65   Social History   Socioeconomic History   Marital status: Married    Spouse name: Not on file   Number of children: Not on file   Years of education: Not on file   Highest education level: Not on file  Occupational History   Not on file  Tobacco Use   Smoking status: Never   Smokeless tobacco: Never  Vaping Use   Vaping Use: Never used  Substance and Sexual Activity   Alcohol use: No    Alcohol/week: 0.0 standard drinks of alcohol   Drug use: No   Sexual activity: Yes    Birth control/protection: None  Other  Topics Concern   Not on file  Social History Narrative   Not on file   Social Determinants of Health   Financial Resource Strain: Low Risk  (11/14/2022)   Overall Financial Resource Strain (CARDIA)    Difficulty of Paying Living Expenses: Not hard at all  Food Insecurity: No Food Insecurity (11/14/2022)   Hunger Vital Sign    Worried About Running Out of Food in the Last Year: Never true    Ran Out of Food in the Last Year: Never true  Transportation Needs: No Transportation Needs (11/14/2022)   PRAPARE - Hydrologist (Medical): No    Lack of Transportation (Non-Medical): No  Physical Activity: Not on file  Stress: No Stress Concern Present (11/14/2022)   [place]    Feeling of Stress : Not at all  Social Connections: Unknown (11/14/2022)   Social Connection and Isolation Panel [NHANES]    Frequency of Communication with Friends and Family: More than three times a week    Frequency of Social Gatherings with Friends and Family: More than three times a week    Attends Religious Services: Not on Advertising copywriter or Organizations: Not on file    Attends Archivist Meetings: Not on file    Marital Status: Married    Tobacco Counseling Counseling given: Not Answered   Clinical Intake:  Pre-visit preparation completed: Yes        Diabetes: No  How often do you need to have someone help you when you read instructions, pamphlets, or other written materials from your doctor or pharmacy?: 1 - Never    Interpreter Needed?: No      Activities of Daily Living    11/14/2022    1:40 PM  In your present state of health, do you have any difficulty performing the following activities:  Hearing? 0  Vision? 0  Difficulty concentrating or making decisions? 0  Walking or climbing stairs? 0  Dressing or bathing? 0  Doing errands, shopping? 0  Preparing Food and eating ? N  Using the Toilet? N  In the past six months, have you accidently leaked urine? N  Do you have problems with loss of bowel control? N  Managing your Medications? N  Managing your Finances? Y  Comment Husband manages  Housekeeping or managing your Housekeeping? N    Patient Care Team: Leone Haven, MD as PCP - General (Family Medicine)  Indicate any recent Medical Services you may have received from other than [facility] providers in the past year (date may be approximate).     Assessment:   This is a routine wellness examination for [place].  I connected with  Mary Wolfe on 11/14/22 by a audio enabled telemedicine application and verified that I am speaking with the correct person using two  identifiers.  Patient Location: Home  Provider Location: Office/Clinic  I discussed the limitations of evaluation and management by telemedicine. The patient expressed understanding and agreed to proceed.   Hearing/Vision screen Hearing Screening - Comments::  Patient is able to hear conversational tones without difficulty. No issues reported. Vision Screening - Comments:: Followed by Dr. Matilde Sprang Wears corrective lenses They have seen their ophthalmologist in the last 12 months    Dietary issues and exercise activities discussed: Current Exercise Habits: Home exercise routine, Type of exercise: stretching (aerobic exercise), Time (Minutes): 15, Frequency (Times/Week): 3, Weekly Exercise (Minutes/Week):  45, Intensity: Mild   Goals Addressed             This Visit's Progress    Increase physical activity       Walk for exercise Lose a little weight Exercise at the gym with husband        Depression Screen    11/14/2022    1:40 PM 02/16/2022    8:58 AM 11/10/2021    2:59 PM 03/29/2021    9:30 AM 11/03/2020   11:14 AM 04/08/2020    9:38 AM 11/01/2019   11:21 AM  PHQ 2/9 Scores  PHQ - 2 Score 0 0 0 0 0 0 0    Fall Risk    11/14/2022    1:46 PM 02/16/2022    8:58 AM 11/10/2021    2:58 PM 03/29/2021    9:30 AM 11/03/2020   11:17 AM  [place] in the past year? 0 0 0 0 0  Number falls in past yr: 0 0 0 0 0  Injury with Fall? 0 0   0  Risk for fall due to :  No Fall Risks     Follow up Falls evaluation completed;Falls prevention discussed Falls evaluation completed Falls evaluation completed Falls evaluation completed Falls evaluation completed    FALL RISK PREVENTION PERTAINING TO THE HOME: Home free of loose throw rugs in walkways, pet beds, electrical cords, etc? Yes  Adequate lighting in your home to reduce risk of falls? Yes   ASSISTIVE DEVICES UTILIZED TO PREVENT FALLS: Life alert? No  Use of a cane, walker or w/c? No   TIMED UP AND GO: Was the test performed?  No .   Cognitive Function:        11/14/2022    1:47 PM 11/10/2021    3:06 PM 11/01/2019   11:25 AM  6CIT Screen  What Year? 0 points 0 points 0 points  What month? 0 points 0 points 0 points  What time? 0 points  0 points  Count back from 20 0 points  0 points  Months in reverse 0 points 0 points 0 points  Repeat phrase 2 points  0 points  Total Score 2 points  0 points    Immunizations Immunization History  Administered Date(s) Administered   PFIZER(Purple Top)SARS-COV-2 Vaccination 12/27/2019, 01/17/2020   Tdap 06/24/2016   Zoster, Live 06/24/2016   Screening Tests Health Maintenance  Topic Date Due   MAMMOGRAM  04/15/2022   COVID-19 Vaccine (3 - 2023-24 season) 11/30/2022 (Originally 05/06/2022)   Medicare Annual Wellness (AWV)  11/14/2023   DTaP/Tdap/Td (2 - Td or Tdap) 06/24/2026   COLONOSCOPY (Pts 45-34yr Insurance coverage will need to be confirmed)  06/25/2028   DEXA SCAN  Completed   Hepatitis C Screening  Completed   HPV VACCINES  Aged Out   Pneumonia Vaccine 70 Years old  Discontinued   INFLUENZA VACCINE  Discontinued   Zoster Vaccines- Shingrix  Discontinued   Health Maintenance Health Maintenance Due  Topic Date Due   MAMMOGRAM  04/15/2022   Mammogram- ordered. Number provided for scheduling [phone]   Lung Cancer Screening: (Low Dose CT Chest recommended if Age 70-80years, 30 pack-year currently smoking OR have quit w/in 15years.) does not qualify.   Hepatitis C Screening: Completed 06/2016.  Vision Screening: Recommended annual ophthalmology exams for early detection of glaucoma and other disorders of the eye.  Dental Screening: Recommended annual dental exams for proper oral hygiene  Community Resource Referral /  Chronic Care Management: CRR required this visit?  No   CCM required this visit?  No      Plan:     I have personally reviewed and noted the following in the patient's chart:   Medical and social history Use of alcohol,  tobacco or illicit drugs  Current medications and supplements including opioid prescriptions. Patient is not currently taking opioid prescriptions. Functional ability and status Nutritional status Physical activity Advanced directives List of other physicians Hospitalizations, surgeries, and ER visits in previous 12 months Vitals Screenings to include cognitive, depression, and falls Referrals and appointments  In addition, I have reviewed and discussed with patient certain preventive protocols, quality metrics, and best practice recommendations. A written personalized care plan for preventive services as well as general preventive health recommendations were provided to patient.     Leta Jungling, LPN   [phone]

## 2022-11-14 NOTE — Patient Instructions (Addendum)
Mary Wolfe , Thank you for taking time to come for your Medicare Wellness Visit. I appreciate your ongoing commitment to your health goals. Please review the following plan we discussed and let me know if I can assist you in the future.   These are the goals we discussed:  Goals      Increase physical activity     Walk for exercise Lose a little weight Exercise at the gym with husband         This is a list of the screening recommended for you and due dates:  Health Maintenance  Topic Date Due   Mammogram  04/15/2022   COVID-19 Vaccine (3 - 2023-24 season) 11/30/2022*   Medicare Annual Wellness Visit  11/14/2023   DTaP/Tdap/Td vaccine (2 - Td or Tdap) 06/24/2026   Colon Cancer Screening  06/25/2028   DEXA scan (bone density measurement)  Completed   Hepatitis C Screening: USPSTF Recommendation to screen - Ages 100-79 yo.  Completed   HPV Vaccine  Aged Out   Pneumonia Vaccine  Discontinued   Flu Shot  Discontinued   Zoster (Shingles) Vaccine  Discontinued  *Topic was postponed. The date shown is not the original due date.    Advanced directives: not yet completed  Conditions/risks identified: none new  Next appointment: Follow up in one year for your annual wellness visit    Preventive Care 65 Years and Older, Female Preventive care refers to lifestyle choices and visits with your health care provider that can promote health and wellness. What does preventive care include? A yearly physical exam. This is also called an annual well check. Dental exams once or twice a year. Routine eye exams. Ask your health care provider how often you should have your eyes checked. Personal lifestyle choices, including: Daily care of your teeth and gums. Regular physical activity. Eating a healthy diet. Avoiding tobacco and drug use. Limiting alcohol use. Practicing safe sex. Taking low-dose aspirin every day. Taking vitamin and mineral supplements as recommended by your health care  provider. What happens during an annual well check? The services and screenings done by your health care provider during your annual well check will depend on your age, overall health, lifestyle risk factors, and family history of disease. Counseling  Your health care provider may ask you questions about your: Alcohol use. Tobacco use. Drug use. Emotional well-being. Home and relationship well-being. Sexual activity. Eating habits. History of falls. Memory and ability to understand (cognition). Work and work Statistician. Reproductive health. Screening  You may have the following tests or measurements: Height, weight, and BMI. Blood pressure. Lipid and cholesterol levels. These may be checked every 5 years, or more frequently if you are over 74 years old. Skin check. Lung cancer screening. You may have this screening every year starting at age 69 if you have a 30-pack-year history of smoking and currently smoke or have quit within the past 15 years. Fecal occult blood test (FOBT) of the stool. You may have this test every year starting at age 72. Flexible sigmoidoscopy or colonoscopy. You may have a sigmoidoscopy every 5 years or a colonoscopy every 10 years starting at age 28. Hepatitis C blood test. Hepatitis B blood test. Sexually transmitted disease (STD) testing. Diabetes screening. This is done by checking your blood sugar (glucose) after you have not eaten for a while (fasting). You may have this done every 1-3 years. Bone density scan. This is done to screen for osteoporosis. You may have this done starting  at age 3. Mammogram. This may be done every 1-2 years. Talk to your health care provider about how often you should have regular mammograms. Talk with your health care provider about your test results, treatment options, and if necessary, the need for more tests. Vaccines  Your health care provider may recommend certain vaccines, such as: Influenza vaccine. This is  recommended every year. Tetanus, diphtheria, and acellular pertussis (Tdap, Td) vaccine. You may need a Td booster every 10 years. Zoster vaccine. You may need this after age 66. Pneumococcal 13-valent conjugate (PCV13) vaccine. One dose is recommended after age 46. Pneumococcal polysaccharide (PPSV23) vaccine. One dose is recommended after age 81. Talk to your health care provider about which screenings and vaccines you need and how often you need them. This information is not intended to replace advice given to you by your health care provider. Make sure you discuss any questions you have with your health care provider. Document Released: 09/18/2015 Document Revised: 05/11/2016 Document Reviewed: 06/23/2015 Elsevier Interactive Patient Education  2017 [place] Prevention in the Home Falls can cause injuries. They can happen to people of all ages. There are many things you can do to make your home safe and to help prevent falls. What can I do on the outside of my home? Regularly fix the edges of walkways and driveways and fix any cracks. Remove anything that might make you trip as you walk through a door, such as a raised step or threshold. Trim any bushes or trees on the path to your home. Use bright outdoor lighting. Clear any walking paths of anything that might make someone trip, such as rocks or tools. Regularly check to see if handrails are loose or broken. Make sure that both sides of any steps have handrails. Any raised decks and porches should have guardrails on the edges. Have any leaves, snow, or ice cleared regularly. Use sand or salt on walking paths during winter. Clean up any spills in your garage right away. This includes oil or grease spills. What can I do in the bathroom? Use night lights. Install grab bars by the toilet and in the tub and shower. Do not use towel bars as grab bars. Use non-skid mats or decals in the tub or shower. If you need to sit down in  the shower, use a plastic, non-slip stool. Keep the floor dry. Clean up any water that spills on the floor as soon as it happens. Remove soap buildup in the tub or shower regularly. Attach bath mats securely with double-sided non-slip rug tape. Do not have throw rugs and other things on the floor that can make you trip. What can I do in the bedroom? Use night lights. Make sure that you have a light by your bed that is easy to reach. Do not use any sheets or blankets that are too big for your bed. They should not hang down onto the floor. Have a firm chair that has side arms. You can use this for support while you get dressed. Do not have throw rugs and other things on the floor that can make you trip. What can I do in the kitchen? Clean up any spills right away. Avoid walking on wet floors. Keep items that you use a lot in easy-to-reach places. If you need to reach something above you, use a strong step stool that has a grab bar. Keep electrical cords out of the way. Do not use floor polish or wax that makes  floors slippery. If you must use wax, use non-skid floor wax. Do not have throw rugs and other things on the floor that can make you trip. What can I do with my stairs? Do not leave any items on the stairs. Make sure that there are handrails on both sides of the stairs and use them. Fix handrails that are broken or loose. Make sure that handrails are as long as the stairways. Check any carpeting to make sure that it is firmly attached to the stairs. Fix any carpet that is loose or worn. Avoid having throw rugs at the top or bottom of the stairs. If you do have throw rugs, attach them to the floor with carpet tape. Make sure that you have a light switch at the top of the stairs and the bottom of the stairs. If you do not have them, ask someone to add them for you. What else can I do to help prevent falls? Wear shoes that: Do not have high heels. Have rubber bottoms. Are comfortable  and fit you well. Are closed at the toe. Do not wear sandals. If you use a stepladder: Make sure that it is fully opened. Do not climb a closed stepladder. Make sure that both sides of the stepladder are locked into place. Ask someone to hold it for you, if possible. Clearly mark and make sure that you can see: Any grab bars or handrails. First and last steps. Where the edge of each step is. Use tools that help you move around (mobility aids) if they are needed. These include: Canes. Walkers. Scooters. Crutches. Turn on the lights when you go into a dark area. Replace any light bulbs as soon as they burn out. Set up your furniture so you have a clear path. Avoid moving your furniture around. If any of your floors are uneven, fix them. If there are any pets around you, be aware of where they are. Review your medicines with your doctor. Some medicines can make you feel dizzy. This can increase your chance of falling. Ask your doctor what other things that you can do to help prevent falls. This information is not intended to replace advice given to you by your health care provider. Make sure you discuss any questions you have with your health care provider. Document Released: 06/18/2009 Document Revised: 01/28/2016 Document Reviewed: 09/26/2014 Elsevier Interactive Patient Education  2017 [employer].

## 2022-11-30 ENCOUNTER — Ambulatory Visit (INDEPENDENT_AMBULATORY_CARE_PROVIDER_SITE_OTHER): Payer: Medicare PPO | Admitting: Family Medicine

## 2022-11-30 ENCOUNTER — Encounter: Payer: Self-pay | Admitting: Family Medicine

## 2022-11-30 VITALS — BP 120/82 | HR 90 | Temp 97.6°F | Ht 62.5 in | Wt 218.0 lb

## 2022-11-30 DIAGNOSIS — S6992XD Unspecified injury of left wrist, hand and finger(s), subsequent encounter: Secondary | ICD-10-CM

## 2022-11-30 DIAGNOSIS — I10 Essential (primary) hypertension: Secondary | ICD-10-CM

## 2022-11-30 DIAGNOSIS — E785 Hyperlipidemia, unspecified: Secondary | ICD-10-CM

## 2022-11-30 DIAGNOSIS — Z9181 History of falling: Secondary | ICD-10-CM | POA: Diagnosis not present

## 2022-11-30 DIAGNOSIS — R7303 Prediabetes: Secondary | ICD-10-CM | POA: Diagnosis not present

## 2022-11-30 DIAGNOSIS — Z1231 Encounter for screening mammogram for malignant neoplasm of breast: Secondary | ICD-10-CM | POA: Diagnosis not present

## 2022-11-30 DIAGNOSIS — S6990XA Unspecified injury of unspecified wrist, hand and finger(s), initial encounter: Secondary | ICD-10-CM | POA: Insufficient documentation

## 2022-11-30 DIAGNOSIS — Z6839 Body mass index (BMI) 39.0-39.9, adult: Secondary | ICD-10-CM | POA: Diagnosis not present

## 2022-11-30 DIAGNOSIS — W19XXXA Unspecified fall, initial encounter: Secondary | ICD-10-CM | POA: Insufficient documentation

## 2022-11-30 LAB — COMPREHENSIVE METABOLIC PANEL
ALT: 15 U/L (ref 0–35)
AST: 18 U/L (ref 0–37)
Albumin: 4.7 g/dL (ref 3.5–5.2)
Alkaline Phosphatase: 90 U/L (ref 39–117)
BUN: 18 mg/dL (ref 6–23)
CO2: 27 mEq/L (ref 19–32)
Calcium: 9.9 mg/dL (ref 8.4–10.5)
Chloride: 105 mEq/L (ref 96–112)
Creatinine, Ser: 0.84 mg/dL (ref 0.40–1.20)
GFR: 70.63 mL/min (ref >60.00)
Glucose, Bld: 105 mg/dL — ABNORMAL HIGH (ref 70–99)
Potassium: 4.3 mEq/L (ref 3.5–5.1)
Sodium: 140 mEq/L (ref 135–145)
Total Bilirubin: 0.5 mg/dL (ref 0.2–1.2)
Total Protein: 7.7 g/dL (ref 6.0–8.3)

## 2022-11-30 LAB — LIPID PANEL
Cholesterol: 281 mg/dL — ABNORMAL HIGH (ref 0–200)
HDL: 53.7 mg/dL (ref >39.00)
LDL Cholesterol: 211 mg/dL — ABNORMAL HIGH (ref 0–99)
NonHDL: 226.98
Total CHOL/HDL Ratio: 5
Triglycerides: 81 mg/dL (ref 0.0–149.0)
VLDL: 16.2 mg/dL (ref 0.0–40.0)

## 2022-11-30 LAB — HEMOGLOBIN A1C: Hgb A1c MFr Bld: 6.4 % (ref 4.6–6.5)

## 2022-11-30 NOTE — Assessment & Plan Note (Signed)
Chronic issue.  Continue to work on [employer] and exercise.  Check lipid panel.

## 2022-11-30 NOTE — Assessment & Plan Note (Addendum)
Chronic issue.  Slightly above goal.  Discussed checking it daily at home for 2 weeks and sending me her readings.  I will follow-up with her in 3 months for her blood pressure.  She will continue amlodipine 10 mg daily.  Discussed goal blood pressure less than 130/80.

## 2022-11-30 NOTE — Assessment & Plan Note (Signed)
Chronic issue.  Check A1c.  Continue to work on diet and exercise.

## 2022-11-30 NOTE — Assessment & Plan Note (Signed)
Discussed falls precautions.  Advised to wear well-fitting shoes.  Advised to remove anything she could trip over at home.  Injury has resolved at this time.

## 2022-11-30 NOTE — Progress Notes (Signed)
Tommi Rumps, MD Phone: [phone]  Mary Wolfe is a 72 y.o. female who presents today for f/u.  HYPERTENSION Disease Monitoring Home BP Monitoring not checking Chest pain- no    Dyspnea- no Medications Compliance-  taking amlodipine.  Edema- no BMET    Component Value Date/Time   NA 141 02/16/2022 0917   K 4.4 02/16/2022 0917   CL 104 02/16/2022 0917   CO2 27 02/16/2022 0917   GLUCOSE 111 (H) 02/16/2022 0917   BUN 14 02/16/2022 0917   CREATININE 0.80 02/16/2022 0917   CREATININE 0.89 06/24/2016 1624   CALCIUM 9.8 02/16/2022 0917   GFRNONAA >60 04/11/2019 1511   GFRAA >60 04/11/2019 1511   Hyperlipidemia: No claudication.  She is eating lots of salads and thinks she may be using too much stressing.  She exercises 3 times a week with an exercise today.  No soda or sweet tea.  Occasional junk food or fried foods.  Left thumb injury: Patient reports she showed it in a car door.  She was evaluated for this and had x-rays which she reports were negative.  She has no pain at this time.  Nail has not fallen off yet.  Fall: Patient had a fall last year.  She not exactly sure why she fell she did hurt her left shoulder though has had no recent pain.  Social History   Tobacco Use  Smoking Status Never  Smokeless Tobacco Never    Current Outpatient Medications on File Prior to Visit  Medication Sig Dispense Refill   amLODipine (NORVASC) 10 MG tablet TAKE 1 TABLET(10 MG) BY MOUTH DAILY 90 tablet 1   cholecalciferol (VITAMIN D3) 25 MCG (1000 UT) tablet Take 1,000 Units by mouth daily.     No current facility-administered medications on file prior to visit.     ROS see history of present illness  Objective  Physical Exam Vitals:   11/30/22 0957 11/30/22 1009  BP: 126/82 120/82  Pulse: 90   Temp: 97.6 F (36.4 C)   SpO2: 99%     BP Readings from Last 3 Encounters:  11/30/22 120/82  02/16/22 125/80  03/29/21 120/70   Wt Readings from Last 3 Encounters:   11/30/22 218 lb (98.9 kg)  11/14/22 217 lb (98.4 kg)  02/16/22 217 lb (98.4 kg)    Physical Exam Constitutional:      General: She is not in acute distress.    Appearance: She is not diaphoretic.  Cardiovascular:     Rate and Rhythm: Normal rate and regular rhythm.     Heart sounds: Normal heart sounds.  Pulmonary:     Effort: Pulmonary effort is normal.     Breath sounds: Normal breath sounds.  Skin:    General: Skin is warm and dry.  Neurological:     Mental Status: She is alert.      Assessment/Plan: Please see individual problem list.  Hyperlipidemia, unspecified hyperlipidemia type Assessment & Plan: Chronic issue.  Continue to work on [employer] and exercise.  Check lipid panel.  Orders: -     Comprehensive metabolic panel -     Lipid panel  Prediabetes Assessment & Plan: Chronic issue.  Check A1c.  Continue to work on diet and exercise.  Orders: -     Hemoglobin A1c  Essential hypertension Assessment & Plan: Chronic issue.  Slightly above goal.  Discussed checking it daily at home for 2 weeks and sending me her readings.  I will follow-up with her in 3  months for her blood pressure.  She will continue amlodipine 10 mg daily.  Discussed goal blood pressure less than 130/80.   Class 2 severe obesity due to excess calories with serious comorbidity and body mass index (BMI) of 39.0 to 39.9 in adult [facility]) Assessment & Plan: Encouraged healthy diet and exercise.   Injury of left thumb, subsequent encounter Assessment & Plan: Improved.  Discussed her nail would likely fall off in the near future.   Fall, initial encounter Assessment & Plan: Discussed falls precautions.  Advised to wear well-fitting shoes.  Advised to remove anything she could trip over at home.  Injury has resolved at this time.   Encounter for screening mammogram for malignant neoplasm of breast -     3D Screening Mammogram, Left and Right; Future     Health Maintenance: Patient  will call to schedule her mammogram.  Return in about 3 months (around 03/02/2023) for Hypertension.   Tommi Rumps, MD [place]

## 2022-11-30 NOTE — Assessment & Plan Note (Signed)
Improved.  Discussed her nail would likely fall off in the near future.

## 2022-11-30 NOTE — Patient Instructions (Signed)
Nice to see you. Will call you with your lab results. Please call [phone] to schedule your mammogram. Please check your blood pressure daily for the next 2 weeks and get me a list of your readings so we can see if you are adequately controlled at home.

## 2022-11-30 NOTE — Assessment & Plan Note (Signed)
Encouraged healthy diet and exercise. 

## 2022-12-01 ENCOUNTER — Telehealth: Payer: Self-pay

## 2022-12-01 NOTE — Telephone Encounter (Signed)
-----   Message from Leone Haven, MD sent at 12/01/2022  9:01 AM EDT ----- Please let the patient know her cholesterol is very uncontrolled. I would recommend starting an injectable cholesterol medication called repatha. I can send this in once you speak with her. She would need labs 6 weeks after starting on this medication. If it is too expensive she should let us know. A1c has worsened slightly though remains in the prediabetic range. Patient needs to continue to work on her diet and exercise for this.

## 2022-12-01 NOTE — Telephone Encounter (Signed)
LMTCB regarding lab results.  

## 2022-12-16 ENCOUNTER — Encounter: Payer: Self-pay | Admitting: Family Medicine

## 2023-03-15 ENCOUNTER — Encounter: Payer: Self-pay | Admitting: Family Medicine

## 2023-03-15 ENCOUNTER — Ambulatory Visit (INDEPENDENT_AMBULATORY_CARE_PROVIDER_SITE_OTHER): Payer: Medicare PPO | Admitting: Family Medicine

## 2023-03-15 VITALS — BP 126/80 | HR 92 | Temp 98.0°F | Ht 62.5 in | Wt 222.0 lb

## 2023-03-15 DIAGNOSIS — R7303 Prediabetes: Secondary | ICD-10-CM

## 2023-03-15 DIAGNOSIS — I10 Essential (primary) hypertension: Secondary | ICD-10-CM

## 2023-03-15 DIAGNOSIS — E785 Hyperlipidemia, unspecified: Secondary | ICD-10-CM | POA: Diagnosis not present

## 2023-03-15 LAB — POCT GLYCOSYLATED HEMOGLOBIN (HGB A1C): Hemoglobin A1C: 5.8 % — AB (ref 4.0–5.6)

## 2023-03-15 MED ORDER — AMLODIPINE BESYLATE 10 MG PO TABS: ORAL_TABLET | ORAL | 2 refills | Status: DC

## 2023-03-15 NOTE — Assessment & Plan Note (Signed)
Chronic issue.  Patient declines PCSK9 inhibitor or referral to lipid clinic.  Discussed lower risk of muscle and joint aches with PCSK9 inhibitors and other more advanced medicines for hyperlipidemia.  We will plan on checking her cholesterol once yearly.

## 2023-03-15 NOTE — Progress Notes (Signed)
Marikay Alar, MD Phone: [phone]  Mary Wolfe is a 72 y.o. female who presents today for f/u.  HYPERTENSION Disease Monitoring Home BP Monitoring 115/80 Chest pain- no    Dyspnea- no Medications Compliance-  taking amlodipine.  Edema- note minimal in her ankles, no orthopnea, no PND, notes it resolves over night BMET    Component Value Date/Time   NA 140 11/30/2022 1015   K 4.3 11/30/2022 1015   CL 105 11/30/2022 1015   CO2 27 11/30/2022 1015   GLUCOSE 105 (H) 11/30/2022 1015   BUN 18 11/30/2022 1015   CREATININE 0.84 11/30/2022 1015   CREATININE 0.89 06/24/2016 1624   CALCIUM 9.9 11/30/2022 1015   GFRNONAA >60 04/11/2019 1511   GFRAA >60 04/11/2019 1511   Prediabetes: Patient notes her diet is not been great lately.  She does eat some junk food.  Rarely has sweet tea.  No soda.  [facility] [employer] [facility] and is going to start using the treadmill and doing some of the weight machines.  Hyperlipidemia: Patient declines any further treatment for this issue.  Notes too much achiness with prior treatment options.  Social History   Tobacco Use  Smoking Status Never  Smokeless Tobacco Never    Current Outpatient Medications on File Prior to Visit  Medication Sig Dispense Refill   cholecalciferol (VITAMIN D3) 25 MCG (1000 UT) tablet Take 1,000 Units by mouth daily.     No current facility-administered medications on file prior to visit.     ROS see history of present illness  Objective  Physical Exam Vitals:   03/15/23 0908 03/15/23 0933  BP: 128/82 126/80  Pulse: 92   Temp: 98 F (36.7 C)   SpO2: 99%     BP Readings from Last 3 Encounters:  03/15/23 126/80  11/30/22 120/82  02/16/22 125/80   Wt Readings from Last 3 Encounters:  03/15/23 222 lb (100.7 kg)  11/30/22 218 lb (98.9 kg)  11/14/22 217 lb (98.4 kg)    Physical Exam Constitutional:      General: She is not in acute distress.    Appearance: She is not diaphoretic.  Cardiovascular:      Rate and Rhythm: Normal rate and regular rhythm.     Heart sounds: Normal heart sounds.  Pulmonary:     Effort: Pulmonary effort is normal.     Breath sounds: Normal breath sounds.  Musculoskeletal:     Right lower leg: No edema.     Left lower leg: No edema.  Skin:    General: Skin is warm and dry.  Neurological:     Mental Status: She is alert.      Assessment/Plan: Please see individual problem list.  Essential hypertension Assessment & Plan: Chronic issue.  Generally well-controlled at home.  She can continue amlodipine 10 mg daily.  Discussed a goal blood pressure of less than 130/80.  Encouraged her to start exercising.   Prediabetes Assessment & Plan: Check A1c.  Continue to work on diet and exercise.  Orders: -     POCT glycosylated hemoglobin (Hb A1C)  Hyperlipidemia, unspecified hyperlipidemia type Assessment & Plan: Chronic issue.  Patient declines PCSK9 inhibitor or referral to lipid clinic.  Discussed lower risk of muscle and joint aches with PCSK9 inhibitors and other more advanced medicines for hyperlipidemia.  We will plan on checking her cholesterol once yearly.   Other orders -     amLODIPine Besylate; TAKE 1 TABLET(10 MG) BY MOUTH DAILY  Dispense:  90 tablet; Refill: 2     Return in about 6 months (around 09/15/2023).   Marikay Alar, MD [facility]

## 2023-03-15 NOTE — Assessment & Plan Note (Signed)
Chronic issue.  Generally well-controlled at home.  She can continue amlodipine 10 mg daily.  Discussed a goal blood pressure of less than 130/80.  Encouraged her to start exercising.

## 2023-03-15 NOTE — Assessment & Plan Note (Signed)
Check A1c.  Continue to work on diet and exercise. 

## 2023-03-27 DIAGNOSIS — H35033 Hypertensive retinopathy, bilateral: Secondary | ICD-10-CM | POA: Diagnosis not present

## 2023-03-27 DIAGNOSIS — H40013 Open angle with borderline findings, low risk, bilateral: Secondary | ICD-10-CM | POA: Diagnosis not present

## 2023-03-27 DIAGNOSIS — H2513 Age-related nuclear cataract, bilateral: Secondary | ICD-10-CM | POA: Diagnosis not present

## 2023-03-27 DIAGNOSIS — H35371 Puckering of macula, right eye: Secondary | ICD-10-CM | POA: Diagnosis not present

## 2023-03-27 DIAGNOSIS — H04123 Dry eye syndrome of bilateral lacrimal glands: Secondary | ICD-10-CM | POA: Diagnosis not present

## 2023-05-04 ENCOUNTER — Telehealth: Payer: Self-pay

## 2023-05-04 NOTE — Patient Outreach (Signed)
  Care Coordination   05/04/2023 Name: Mary Wolfe MRN: [id number] DOB: [birth year]   Care Coordination Outreach Attempts:  An unsuccessful telephone outreach was attempted today to offer the patient information about available care coordination services. Attempted call to home.  Unable to reach patient or leave voice message due to phone only ringing.  Attempted call to listed mobile number.  Unable to reach patient or leave message due to voice mail box being full.   Follow Up Plan:  Additional outreach attempts will be made to offer the patient care coordination information and services.   Encounter Outcome:  No Answer   Care Coordination Interventions:  No, not indicated    George Ina [facility] Care Coordination [phone] direct line

## 2023-09-18 ENCOUNTER — Ambulatory Visit (INDEPENDENT_AMBULATORY_CARE_PROVIDER_SITE_OTHER): Payer: Medicare Other | Admitting: Family Medicine

## 2023-09-18 ENCOUNTER — Encounter: Payer: Self-pay | Admitting: Family Medicine

## 2023-09-18 VITALS — BP 134/80 | HR 100 | Temp 97.8°F | Ht 62.5 in | Wt 219.8 lb

## 2023-09-18 DIAGNOSIS — I10 Essential (primary) hypertension: Secondary | ICD-10-CM

## 2023-09-18 DIAGNOSIS — S46819A Strain of other muscles, fascia and tendons at shoulder and upper arm level, unspecified arm, initial encounter: Secondary | ICD-10-CM | POA: Insufficient documentation

## 2023-09-18 DIAGNOSIS — S46811A Strain of other muscles, fascia and tendons at shoulder and upper arm level, right arm, initial encounter: Secondary | ICD-10-CM | POA: Diagnosis not present

## 2023-09-18 DIAGNOSIS — R7303 Prediabetes: Secondary | ICD-10-CM | POA: Diagnosis not present

## 2023-09-18 LAB — BASIC METABOLIC PANEL
BUN: 9 mg/dL (ref 6–23)
CO2: 28 meq/L (ref 19–32)
Calcium: 10 mg/dL (ref 8.4–10.5)
Chloride: 105 meq/L (ref 96–112)
Creatinine, Ser: 0.79 mg/dL (ref 0.40–1.20)
GFR: 75.6 mL/min (ref >60.00)
Glucose, Bld: 114 mg/dL — ABNORMAL HIGH (ref 70–99)
Potassium: 4.3 meq/L (ref 3.5–5.1)
Sodium: 141 meq/L (ref 135–145)

## 2023-09-18 LAB — HEMOGLOBIN A1C: Hgb A1c MFr Bld: 6.7 % — ABNORMAL HIGH (ref 4.6–6.5)

## 2023-09-18 MED ORDER — MELOXICAM 15 MG PO TABS: 15.0000 mg | ORAL_TABLET | Freq: Every day | ORAL | 0 refills | Status: DC | PRN

## 2023-09-18 NOTE — Progress Notes (Signed)
 Camellia Her, MD Phone: [phone]  Mary Wolfe is a 73 y.o. female who presents today for f/u.  HYPERTENSION Disease Monitoring Home BP Monitoring similar at home Chest pain- no    Dyspnea- no Medications Compliance-  taking amlodipine .  Edema- minimal, resolves over night BMET    Component Value Date/Time   NA 140 11/30/2022 1015   K 4.3 11/30/2022 1015   CL 105 11/30/2022 1015   CO2 27 11/30/2022 1015   GLUCOSE 105 (H) 11/30/2022 1015   BUN 18 11/30/2022 1015   CREATININE 0.84 11/30/2022 1015   CREATININE 0.89 06/24/2016 1624   CALCIUM  9.9 11/30/2022 1015   GFRNONAA >60 04/11/2019 1511   GFRAA >60 04/11/2019 1511   Prediabetes: Patient notes her diet has not been good.  She is been eating more fried foods.  She has not been exercising.  No polyuria or polydipsia.  Neck/shoulder discomfort: Patient notes this has been going on a couple weeks.  She notes no injury.  No numbness or tingling.  No radiation of pain.  No weakness.  She has been taking Aleve with no benefit.  Notes the discomfort is a 6/10.  Social History   Tobacco Use  Smoking Status Never  Smokeless Tobacco Never    Current Outpatient Medications on File Prior to Visit  Medication Sig Dispense Refill   amLODipine  (NORVASC ) 10 MG tablet TAKE 1 TABLET(10 MG) BY MOUTH DAILY 90 tablet 2   cholecalciferol (VITAMIN D3) 25 MCG (1000 UT) tablet Take 1,000 Units by mouth daily.     No current facility-administered medications on file prior to visit.     ROS see history of present illness  Objective  Physical Exam Vitals:   09/18/23 0903 09/18/23 0925  BP: 138/80 134/80  Pulse: 100   Temp: 97.8 F (36.6 C)   SpO2: 97%     BP Readings from Last 3 Encounters:  09/18/23 134/80  03/15/23 126/80  11/30/22 120/82   Wt Readings from Last 3 Encounters:  09/18/23 219 lb 12.8 oz (99.7 kg)  03/15/23 222 lb (100.7 kg)  11/30/22 218 lb (98.9 kg)    Physical Exam Constitutional:      General:  She is not in acute distress.    Appearance: She is not diaphoretic.  Cardiovascular:     Rate and Rhythm: Normal rate and regular rhythm.     Heart sounds: Normal heart sounds.  Pulmonary:     Effort: Pulmonary effort is normal.     Breath sounds: Normal breath sounds.  Musculoskeletal:     Comments: No midline neck step-off, no midline neck tenderness, there is tenderness and apparent spasm in her right trapezius muscle  Skin:    General: Skin is warm and dry.  Neurological:     Mental Status: She is alert.     Comments: 5/5 strength in bilateral biceps, triceps, grip, quads, hamstrings, plantar and dorsiflexion, sensation to light touch intact in bilateral UE and LE      Assessment/Plan: Please see individual problem list.  Essential hypertension Assessment & Plan: Chronic issue.  Slightly above goal.  Patient is hesitant to start other medications.  Encouraged her to add an exercise a few times a week and have her blood pressure rechecked in a few months with her new PCP.  She will continue amlodipine  10 mg daily.  Orders: -     Basic metabolic panel  Prediabetes Assessment & Plan: Chronic issue.  Encouraged healthier diet.  Encouraged increasing exercise.  Orders: -  Hemoglobin A1c -     Basic metabolic panel  Strain of right trapezius muscle, initial encounter Assessment & Plan: Patient with right trapezius strain.  Will trial meloxicam  15 mg once daily as needed for pain.  Advised to take with food and if it irritates her stomach she will discontinue use.  Advised not to take for more than 7 to 14 days.  If she needs it for longer than that she will need to let us  know.  Refer for physical therapy as well.  Orders: -     Meloxicam ; Take 1 tablet (15 mg total) by mouth daily as needed for pain.  Dispense: 30 tablet; Refill: 0 -     Ambulatory referral to Physical Therapy     Return in about 3 months (around 12/17/2023) for Hypertension follow-up/transfer of  care.   Camellia Her, MD [facility]

## 2023-09-18 NOTE — Assessment & Plan Note (Signed)
 Chronic issue.  Encouraged healthier diet.  Encouraged increasing exercise.

## 2023-09-18 NOTE — Assessment & Plan Note (Signed)
 Chronic issue.  Slightly above goal.  Patient is hesitant to start other medications.  Encouraged her to add an exercise a few times a week and have her blood pressure rechecked in a few months with her new PCP.  She will continue amlodipine 10 mg daily.

## 2023-09-18 NOTE — Patient Instructions (Signed)
 Nice to see you. Please increase your activity level as this will likely help with your blood pressure. You can take the meloxicam  for your neck pain.  Please take this with food.  If it irritates your stomach please let us  know.  Please do not take this for more than 1 to 2 weeks. Physical therapy should contact you to set up evaluation for your neck pain.

## 2023-09-18 NOTE — Assessment & Plan Note (Signed)
 Patient with right trapezius strain.  Will trial meloxicam  15 mg once daily as needed for pain.  Advised to take with food and if it irritates her stomach she will discontinue use.  Advised not to take for more than 7 to 14 days.  If she needs it for longer than that she will need to let us  know.  Refer for physical therapy as well.

## 2023-09-19 ENCOUNTER — Telehealth: Payer: Self-pay

## 2023-09-19 NOTE — Telephone Encounter (Signed)
 Left message to call the office back regarding the lab results below. Okay to give lab results.

## 2023-09-19 NOTE — Telephone Encounter (Signed)
-----   Message from Camellia Her sent at 09/18/2023  3:43 PM EST ----- Please let the patient know her A1c has jumped up to 6.7.  This is in the diabetic range.  She needs to work on diet and exercise and have this rechecked when she follows up with Dr. Hope in April.  I can send her dietary information if she would like.

## 2023-09-20 ENCOUNTER — Telehealth: Payer: Self-pay

## 2023-09-20 NOTE — Telephone Encounter (Signed)
 Copied from CRM [id number]. Topic: Clinical - Lab/Test Results >> Sep 20, 2023  8:14 AM Crist Dominion wrote: Reason for CRM: Patient would like to inform Dr. Selinda Dales nurse that has already seen her lab results on MyChart.

## 2023-09-22 ENCOUNTER — Encounter: Payer: Self-pay | Admitting: *Deleted

## 2023-09-22 NOTE — Telephone Encounter (Signed)
Noted - see result note 

## 2023-09-27 DIAGNOSIS — H35371 Puckering of macula, right eye: Secondary | ICD-10-CM | POA: Diagnosis not present

## 2023-09-27 DIAGNOSIS — H40013 Open angle with borderline findings, low risk, bilateral: Secondary | ICD-10-CM | POA: Diagnosis not present

## 2023-09-27 DIAGNOSIS — H04123 Dry eye syndrome of bilateral lacrimal glands: Secondary | ICD-10-CM | POA: Diagnosis not present

## 2023-09-27 DIAGNOSIS — H35033 Hypertensive retinopathy, bilateral: Secondary | ICD-10-CM | POA: Diagnosis not present

## 2023-09-27 DIAGNOSIS — H2513 Age-related nuclear cataract, bilateral: Secondary | ICD-10-CM | POA: Diagnosis not present

## 2023-10-05 ENCOUNTER — Ambulatory Visit: Payer: Self-pay | Admitting: Family Medicine

## 2023-10-05 NOTE — Telephone Encounter (Signed)
  Chief Complaint: sore throat Symptoms: chills, cough, H/A Frequency: Wednesday Pertinent Negatives: Patient denies SOB, chest pain, drooling Disposition: [] ED /[] Urgent Care (no appt availability in office) / [x] Appointment(In office/virtual)/ []  [place] [facility]/ [] Home Care/ [] Refused Recommended Disposition /[] [place] [facility]/ []  Follow-up with PCP Additional Notes: Patient c/o sore throat since Wednesday along with H.A, chills, and cough. Reports she is taking meloxicam and was instructed to not take anything with it by PCP. Endorses she is able to keep fluids down. Scheduled patient per protocol on 10/06/2023. Patient verbalized understanding and to call back with worsening symptoms.    Copied from CRM [id number]. Topic: Clinical - Red Word Triage >> Oct 05, 2023  8:12 AM Shelbie Proctor wrote: Red Word that prompted transfer to Nurse Triage: Patient [phone] is having chills, sore throat, and fever. Patient fell off the steps outside on Saturday, unsure if the she hit her head, she thinks just the body, pain in neck, sore and right arm. Patient was taking Meloxicam for pain but was advised only to use it for 2 weeks. Patient denies shortness of breath, or dizziness. Reason for Disposition  [1] Sore throat is the only symptom AND [2] present > 48 hours  Answer Assessment - Initial Assessment Questions 1. ONSET: "When did the throat start hurting?" (Hours or days ago)      Wednesday 2. SEVERITY: "How bad is the sore throat?" (Scale 1-10; mild, moderate or severe)   - MILD (1-3):  Doesn't interfere with eating or normal activities.   - MODERATE (4-7): Interferes with eating some solids and normal activities.   - SEVERE (8-10):  Excruciating pain, interferes with most normal activities.   - SEVERE WITH DYSPHAGIA (10): Can't swallow liquids, drooling.     Moderate - no appetite 3. STREP EXPOSURE: "Has there been any exposure to strep within the past week?" If Yes, ask: "What type  of contact occurred?"      no 4.  VIRAL SYMPTOMS: "Are there any symptoms of a cold, such as a runny nose, cough, hoarse voice or red eyes?"      Chills H/A Cough - non productive, painful 5. FEVER: "Do you have a fever?" If Yes, ask: "What is your temperature, how was it measured, and when did it start?"     Yes, low grade 6. PUS ON THE TONSILS: "Is there pus on the tonsils in the back of your throat?"     No I haven't looked 7. OTHER SYMPTOMS: "Do you have any other symptoms?" (e.g., difficulty breathing, headache, rash)     H/A  Denies SOB, chest pain Of note, has been on meloxicam and unsure if she can take other meds -- reports MD told her not to  Protocols used: Sore Throat-A-AH

## 2023-10-05 NOTE — Telephone Encounter (Signed)
Called pt and she stated that the fall happened so fast that she does not know what she hit other then her arm. She denies any vomiting, but did say she has a headache. I did advise her to go to UC if she got worse, and she stated that she did not have a way. I advise pt that if it got worse and the UC was closed to go to the ER. She stated that she would go if the headache got worse, and if not we will see her tomorrow.

## 2023-10-06 ENCOUNTER — Encounter: Payer: Self-pay | Admitting: Family Medicine

## 2023-10-06 ENCOUNTER — Encounter: Payer: Self-pay | Admitting: Nurse Practitioner

## 2023-10-06 ENCOUNTER — Ambulatory Visit (INDEPENDENT_AMBULATORY_CARE_PROVIDER_SITE_OTHER): Payer: Medicare Other | Admitting: Nurse Practitioner

## 2023-10-06 ENCOUNTER — Ambulatory Visit: Payer: Self-pay | Admitting: Family Medicine

## 2023-10-06 VITALS — BP 134/86 | HR 102 | Temp 98.2°F | Ht 62.5 in | Wt 214.4 lb

## 2023-10-06 DIAGNOSIS — J069 Acute upper respiratory infection, unspecified: Secondary | ICD-10-CM | POA: Diagnosis not present

## 2023-10-06 DIAGNOSIS — R509 Fever, unspecified: Secondary | ICD-10-CM | POA: Diagnosis not present

## 2023-10-06 MED ORDER — AMOXICILLIN-POT CLAVULANATE 875-125 MG PO TABS: 1.0000 | ORAL_TABLET | Freq: Two times a day (BID) | ORAL | 0 refills | Status: DC

## 2023-10-06 NOTE — Telephone Encounter (Signed)
Chief Complaint: diarrhea Symptoms: diarrhea Frequency: today Pertinent Negatives: Patient denies fever, blood in diarrhea, abdominal pain Disposition: [] ED /[] Urgent Care (no appt availability in office) / [] Appointment(In office/virtual)/ []  [place] [facility]/ [x] Home Care/ [] Refused Recommended Disposition /[] [place] [facility]/ []  Follow-up with PCP Additional Notes: Patient called in stating she just started having diarrhea about an hour ago and was asking if it was okay to begin her Augmentin that was prescribed to her today for URI. Advised patient it is okay to begin medication. Patient states she is still eating and drinking and is not having abdominal pain or blood in diarrhea. Advised patient to call back if symptoms worsen and to maintain proper hydration and rest.   Copied from CRM [id number]. Topic: Clinical - Pink Word Triage >> Oct 06, 2023  5:49 PM Suzette B wrote: Reason for Triage: Patient just started on a new medication today that was prescribed to her  amoxicillin-clavulanate (AUGMENTIN) 875-125 MG tablet, patient states she started it and is concerned she may have an allergic side affect to it and needed to speak with some one to make sure Reason for Disposition  MILD-MODERATE diarrhea (e.g., 1-6 times / day more than normal)  Answer Assessment - Initial Assessment Questions 1. DIARRHEA SEVERITY: "How bad is the diarrhea?" "How many more stools have you had in the past 24 hours than normal?"    - NO DIARRHEA (SCALE 0)   - MILD (SCALE 1-3): Few loose or mushy BMs; increase of 1-3 stools over normal daily number of stools; mild increase in ostomy output.   -  MODERATE (SCALE 4-7): Increase of 4-6 stools daily over normal; moderate increase in ostomy output.   -  SEVERE (SCALE 8-10; OR "WORST POSSIBLE"): Increase of 7 or more stools daily over normal; moderate increase in ostomy output; incontinence.     3  2. ONSET: "When did the diarrhea begin?"      About an hour  ago 3. BM CONSISTENCY: "How loose or watery is the diarrhea?"      Loose 4. VOMITING: "Are you also vomiting?" If Yes, ask: "How many times in the past 24 hours?"      No 5. ABDOMEN PAIN: "Are you having any abdomen pain?" If Yes, ask: "What does it feel like?" (e.g., crampy, dull, intermittent, constant)      No 6. ABDOMEN PAIN SEVERITY: If present, ask: "How bad is the pain?"  (e.g., Scale 1-10; mild, moderate, or severe)   - MILD (1-3): doesn't interfere with normal activities, abdomen soft and not tender to touch    - MODERATE (4-7): interferes with normal activities or awakens from sleep, abdomen tender to touch    - SEVERE (8-10): excruciating pain, doubled over, unable to do any normal activities       No 7. ORAL INTAKE: If vomiting, "Have you been able to drink liquids?" "How much liquids have you had in the past 24 hours?"     Yes 8. HYDRATION: "Any signs of dehydration?" (e.g., dry mouth [not just dry lips], too weak to stand, dizziness, new weight loss) "When did you last urinate?"     Yes 10. ANTIBIOTIC USE: "Are you taking antibiotics now or have you taken antibiotics in the past 2 months?"       Patient was prescribed Augmentin but hasn't started it yet.  11. OTHER SYMPTOMS: "Do you have any other symptoms?" (e.g., fever, blood in stool)       No  Protocols used: Diarrhea-A-AH

## 2023-10-06 NOTE — Assessment & Plan Note (Addendum)
Fever, headache, cough, postnasal drip, and loss of appetite since Wednesday. No chest pain or nasal congestion. No recent antibiotics. Flu and COVID negative. -Start Augmentin. -Advise to take plain Mucinex to help break up phlegm. -Encourage increased fluid intake, including warm tea with honey and lime.

## 2023-10-06 NOTE — Telephone Encounter (Signed)
 This encounter was created in error - please disregard.

## 2023-10-06 NOTE — Progress Notes (Unsigned)
Established Patient Office Visit  Subjective:  Patient ID: Mary Wolfe, female    DOB: November 19, 1950  Age: 72 y.o. MRN: [id number]  CC:  Chief Complaint  Patient presents with   Sore Throat    Sore throat, fever, dry cough, headache started on 10/04/23    HPI  Mary Wolfe presents for with subjective fever, headache, chills, and loss of appetite.  She has been experiencing fever, headache, chills, and loss of appetite since Wednesday. The fever has likely subsided, but the headache is localized. She also has a dry cough and postnasal drip, with a runny nose. No nasal congestion, chest pain, or ear pain. She is able to swallow and drink water despite the lack of appetite. No stomach pain, nausea, or vomiting.  She fell on Saturday while going up the steps, landing on her right side, primarily affecting her shoulder. She is scheduled for therapy on February 12th. She does not recall hitting her head during the fall. She is currently on meloxicam. HPI   Past Medical History:  Diagnosis Date   Bursitis    Diverticulitis    Hyperlipidemia    Hypertension    Iron deficiency anemia    OA (osteoarthritis)    Obesity    PONV (postoperative nausea and vomiting)     Past Surgical History:  Procedure Laterality Date   CARPAL TUNNEL RELEASE Right    CESAREAN SECTION     COLONOSCOPY  03/14/2008   COLONOSCOPY WITH PROPOFOL N/A 06/25/2018   Procedure: COLONOSCOPY WITH PROPOFOL;  Surgeon: Christena Deem, MD;  Location: [facility] ENDOSCOPY;  Service: Endoscopy;  Laterality: N/A;   ESOPHAGOGASTRODUODENOSCOPY  04/01/1997   ESOPHAGOGASTRODUODENOSCOPY (EGD) WITH PROPOFOL N/A 06/25/2018   Procedure: ESOPHAGOGASTRODUODENOSCOPY (EGD) WITH PROPOFOL;  Surgeon: Christena Deem, MD;  Location: [facility] ENDOSCOPY;  Service: Endoscopy;  Laterality: N/A;   removed glass from elbow     from accident   TONSILLECTOMY     TOTAL HIP ARTHROPLASTY Right 04/16/2019   Procedure: Right Anterior Total Hip  Arthroplasty;  Surgeon: Marcene Corning, MD;  Location: [facility] ORS;  Service: Orthopedics;  Laterality: Right;    Family History  Problem Relation Age of Onset   Heart disease Other    Arthritis Other    Breast cancer Maternal Aunt 21    Social History   Socioeconomic History   Marital status: Married    Spouse name: Not on file   Number of children: Not on file   Years of education: Not on file   Highest education level: Associate degree: occupational, Scientist, product/process development, or vocational program  Occupational History   Not on file  Tobacco Use   Smoking status: Never   Smokeless tobacco: Never  Vaping Use   Vaping status: Never Used  Substance and Sexual Activity   Alcohol use: No    Alcohol/week: 0.0 standard drinks of alcohol   Drug use: No   Sexual activity: Yes    Birth control/protection: None  Other Topics Concern   Not on file  Social History Narrative   Not on file   Social Drivers of Health   Financial Resource Strain: Low Risk  (09/16/2023)   Overall Financial Resource Strain (CARDIA)    Difficulty of Paying Living Expenses: Not hard at all  Food Insecurity: No Food Insecurity (09/16/2023)   Hunger Vital Sign    Worried About Running Out of Food in the Last Year: Never true    Ran Out of Food in the Last Year: Never true  Transportation Needs: No Transportation Needs (09/16/2023)   PRAPARE - Administrator, Civil Service (Medical): No    Lack of Transportation (Non-Medical): No  Physical Activity: Inactive (09/16/2023)   Exercise Vital Sign    Days of Exercise per Week: 0 days    Minutes of Exercise per Session: 10 min  Stress: No Stress Concern Present (09/16/2023)   [employer] of Occupational Health - Occupational Stress Questionnaire    Feeling of Stress : Not at all  Social Connections: Socially Integrated (09/16/2023)   Social Connection and Isolation Panel [NHANES]    Frequency of Communication with Friends and Family: Three times a week     Frequency of Social Gatherings with Friends and Family: More than three times a week    Attends Religious Services: More than 4 times per year    Active Member of [employer] or Organizations: Yes    Attends Engineer, structural: More than 4 times per year    Marital Status: Married  Catering manager Violence: Not At Risk (11/14/2022)   Humiliation, Afraid, Rape, and Kick questionnaire    Fear of Current or Ex-Partner: No    Emotionally Abused: No    Physically Abused: No    Sexually Abused: No     Outpatient Medications Prior to Visit  Medication Sig Dispense Refill   amLODipine (NORVASC) 10 MG tablet TAKE 1 TABLET(10 MG) BY MOUTH DAILY 90 tablet 2   cholecalciferol (VITAMIN D3) 25 MCG (1000 UT) tablet Take 1,000 Units by mouth daily.     meloxicam (MOBIC) 15 MG tablet Take 1 tablet (15 mg total) by mouth daily as needed for pain. 30 tablet 0   No facility-administered medications prior to visit.    Allergies  Allergen Reactions   Nickel Rash    ROS Review of Systems Negative unless indicated in HPI.    Objective:    Physical Exam HENT:     Right Ear: Tympanic membrane normal. Tympanic membrane is not erythematous.     Left Ear: Tympanic membrane normal. Tympanic membrane is not erythematous.     Nose:     Right Turbinates: Not enlarged.     Left Turbinates: Not enlarged.     Right Sinus: No maxillary sinus tenderness or frontal sinus tenderness.     Left Sinus: No maxillary sinus tenderness or frontal sinus tenderness.     Mouth/Throat:     Mouth: Mucous membranes are moist.     Pharynx: No pharyngeal swelling, oropharyngeal exudate or posterior oropharyngeal erythema.     Tonsils: No tonsillar exudate. 1+ on the right. 1+ on the left.  Cardiovascular:     Rate and Rhythm: Normal rate and regular rhythm.  Pulmonary:     Effort: Pulmonary effort is normal.     Breath sounds: Normal breath sounds. No stridor. No wheezing.  Neurological:     General: No focal  deficit present.     Mental Status: She is oriented to person, place, and time. Mental status is at baseline.  Psychiatric:        Mood and Affect: Mood normal.        Behavior: Behavior normal.        Thought Content: Thought content normal.        Judgment: Judgment normal.     BP 134/86   Pulse (!) 102   Temp 98.2 F (36.8 C)   Ht 5' 2.5" (1.588 m)   Wt 214 lb 6.4 oz (97.3 kg)  SpO2 98%   BMI 38.59 kg/m  Wt Readings from Last 3 Encounters:  10/06/23 214 lb 6.4 oz (97.3 kg)  09/18/23 219 lb 12.8 oz (99.7 kg)  03/15/23 222 lb (100.7 kg)     Health Maintenance  Topic Date Due   MAMMOGRAM  04/15/2022   Medicare Annual Wellness (AWV)  11/14/2023   COVID-19 Vaccine (3 - 2024-25 season) 10/21/2023 (Originally 05/07/2023)   INFLUENZA VACCINE  12/04/2023 (Originally 04/06/2023)   DTaP/Tdap/Td (2 - Td or Tdap) 06/24/2026   Colonoscopy  06/25/2028   DEXA SCAN  Completed   Hepatitis C Screening  Completed   HPV VACCINES  Aged Out   Pneumonia Vaccine 39+ Years old  Discontinued   Zoster Vaccines- Shingrix  Discontinued    There are no preventive care reminders to display for this patient.  Lab Results  Component Value Date   TSH 1.18 10/18/2017   Lab Results  Component Value Date   WBC 8.4 04/11/2019   HGB 12.8 04/11/2019   HCT 39.9 04/11/2019   MCV 91.9 04/11/2019   PLT 217 04/11/2019   Lab Results  Component Value Date   NA 141 09/18/2023   K 4.3 09/18/2023   CO2 28 09/18/2023   GLUCOSE 114 (H) 09/18/2023   BUN 9 09/18/2023   CREATININE 0.79 09/18/2023   BILITOT 0.5 11/30/2022   ALKPHOS 90 11/30/2022   AST 18 11/30/2022   ALT 15 11/30/2022   PROT 7.7 11/30/2022   ALBUMIN 4.7 11/30/2022   CALCIUM 10.0 09/18/2023   ANIONGAP 11 04/11/2019   GFR 75.60 09/18/2023   Lab Results  Component Value Date   CHOL 281 (H) 11/30/2022   Lab Results  Component Value Date   HDL 53.70 11/30/2022   Lab Results  Component Value Date   LDLCALC 211 (H) 11/30/2022    Lab Results  Component Value Date   TRIG 81.0 11/30/2022   Lab Results  Component Value Date   CHOLHDL 5 11/30/2022   Lab Results  Component Value Date   HGBA1C 6.7 (H) 09/18/2023      Assessment & Plan:  URI with cough and congestion Assessment & Plan: Fever, headache, cough, postnasal drip, and loss of appetite since Wednesday. No chest pain or nasal congestion. No recent antibiotics. Flu and COVID negative. -Start Augmentin. -Advise to take plain Mucinex to help break up phlegm. -Encourage increased fluid intake, including warm tea with honey and lime.   Orders: -     Amoxicillin-Pot Clavulanate; Take 1 tablet by mouth 2 (two) times daily.  Dispense: 20 tablet; Refill: 0  Fever, unspecified fever cause -     POC COVID-19 BinaxNow -     POCT Influenza A/B    Follow-up: Return if symptoms worsen or fail to improve.   Kara Dies, NP

## 2023-10-06 NOTE — Patient Instructions (Signed)
Please start taking Augmentin as prescribed and use plain Mucinex to help with phlegm. Increase your fluid intake, including warm tea with honey and lime. Continue with your scheduled physical therapy on February 12th. If your symptoms do not improve, notify our office for re-evaluation.

## 2023-10-06 NOTE — Telephone Encounter (Signed)
Seen today by Evelene Croon

## 2023-10-09 LAB — POC COVID19 BINAXNOW: SARS Coronavirus 2 Ag: NEGATIVE

## 2023-10-09 LAB — POCT INFLUENZA A/B
Influenza A, POC: NEGATIVE
Influenza B, POC: NEGATIVE

## 2023-10-09 NOTE — Telephone Encounter (Signed)
 Noted

## 2023-10-10 ENCOUNTER — Encounter: Payer: Self-pay | Admitting: *Deleted

## 2023-10-17 ENCOUNTER — Encounter: Payer: Self-pay | Admitting: *Deleted

## 2023-10-17 ENCOUNTER — Telehealth: Payer: Self-pay | Admitting: Physical Therapy

## 2023-10-17 NOTE — Telephone Encounter (Signed)
Called pt to inquire about moving up eval. Pt did not respond so left VM instructing pt to call back if she wanted to be seen today instead of tomorrow.

## 2023-10-18 ENCOUNTER — Encounter: Payer: Self-pay | Admitting: Physical Therapy

## 2023-10-18 ENCOUNTER — Ambulatory Visit: Payer: Medicare Other | Attending: Family Medicine | Admitting: Physical Therapy

## 2023-10-18 DIAGNOSIS — M542 Cervicalgia: Secondary | ICD-10-CM | POA: Insufficient documentation

## 2023-10-18 DIAGNOSIS — S46811A Strain of other muscles, fascia and tendons at shoulder and upper arm level, right arm, initial encounter: Secondary | ICD-10-CM | POA: Insufficient documentation

## 2023-10-18 DIAGNOSIS — M25511 Pain in right shoulder: Secondary | ICD-10-CM | POA: Diagnosis not present

## 2023-10-18 NOTE — Therapy (Addendum)
OUTPATIENT PHYSICAL THERAPY UPPER EXTREMITY EVALUATION   Patient Name: Mary Wolfe MRN: [id number] DOB:Apr 01, 1951, 73 y.o., female Today's Date: 10/18/2023  END OF SESSION:  PT End of Session - 10/18/23 0939     Visit Number 1    Number of Visits 20    Date for PT Re-Evaluation 12/27/23    Authorization Type Medicare 2025    Authorization - Visit Number 1    Authorization - Number of Visits 20    Progress Note Due on Visit 10    PT Start Time 0945    PT Stop Time 1030    PT Time Calculation (min) 45 min    Activity Tolerance Patient tolerated treatment well    Behavior During Therapy WFL for tasks assessed/performed             Past Medical History:  Diagnosis Date   Bursitis    Diverticulitis    Hyperlipidemia    Hypertension    Iron deficiency anemia    OA (osteoarthritis)    Obesity    PONV (postoperative nausea and vomiting)    Past Surgical History:  Procedure Laterality Date   CARPAL TUNNEL RELEASE Right    CESAREAN SECTION     COLONOSCOPY  03/14/2008   COLONOSCOPY WITH PROPOFOL N/A 06/25/2018   Procedure: COLONOSCOPY WITH PROPOFOL;  Surgeon: Christena Deem, MD;  Location: [facility] ENDOSCOPY;  Service: Endoscopy;  Laterality: N/A;   ESOPHAGOGASTRODUODENOSCOPY  04/01/1997   ESOPHAGOGASTRODUODENOSCOPY (EGD) WITH PROPOFOL N/A 06/25/2018   Procedure: ESOPHAGOGASTRODUODENOSCOPY (EGD) WITH PROPOFOL;  Surgeon: Christena Deem, MD;  Location: [facility] ENDOSCOPY;  Service: Endoscopy;  Laterality: N/A;   removed glass from elbow     from accident   TONSILLECTOMY     TOTAL HIP ARTHROPLASTY Right 04/16/2019   Procedure: Right Anterior Total Hip Arthroplasty;  Surgeon: Marcene Corning, MD;  Location: [facility] ORS;  Service: Orthopedics;  Laterality: Right;   Patient Active Problem List   Diagnosis Date Noted   URI with cough and congestion 10/06/2023   Trapezius strain 09/18/2023   Thumb injury 11/30/2022   Fall 11/30/2022   Prediabetes 06/26/2019   Greater  trochanteric bursitis of right hip 01/21/2019   Primary osteoarthritis of right hip 04/01/2016   Obesity 04/01/2016   Essential hypertension 01/31/2016   Hyperlipidemia 01/31/2016    PCP: Glori Luis, MD  REFERRING PROVIDER: Glori Luis, MD  REFERRING DIAG: [phone] (ICD-10-CM) - Strain of right trapezius muscle, initial encounter   THERAPY DIAG:  Neck pain on right side  Acute pain of right shoulder  Rationale for Evaluation and Treatment: Rehabilitation  ONSET DATE: End of 2024   SUBJECTIVE:  SUBJECTIVE STATEMENT: See pertinent history  Hand dominance: Left  PERTINENT HISTORY: Pt says she fell on the left side a year or two ago, but that went away on its own. Recently, she has been having right neck/shoulder pain that started insidiously, but she fell on the 25th going up the steps on the right shoulder which increased that pain. She says she feels more limitation and pain with neck movement, more than shoulder. She went on pain medication and it has been feeling better. 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   PAIN:  Are you having pain? Yes: NPRS scale: 4/10 (8/10 worst) Pain location: upper trap, between neck and shoulder  Pain description: aching Aggravating factors: sleeping, sitting at the computer  Relieving factors: pain medication, icing (but doesn't like the cold)   PRECAUTIONS: None  RED FLAGS: Cervical red flags: Dysphagia No, Diplopia No, and Nausea No   WEIGHT BEARING RESTRICTIONS: No  FALLS:  Has patient fallen in last 6 months? Yes, 1 fall going up the steps   LIVING ENVIRONMENT: Lives with: lives with their family Lives  in: House/apartment Stairs: Yes: Internal: 14 steps; can reach both Has following equipment at home: None  OCCUPATION: Diplomatic Services operational officer, works twice a week   PLOF: Independent  PATIENT GOALS: To be pain-free  NEXT MD VISIT: Not scheduled yet   OBJECTIVE:  Note: Objective measures were completed at Evaluation unless otherwise noted.  Vitals: BP 123/67 HR 92  SpO2 100  DIAGNOSTIC FINDINGS:  None   PATIENT SURVEYS :  NDI 16%  COGNITION: Overall cognitive status: Within functional limits for tasks assessed     SENSATION: WFL  POSTURE: Forward head, rounded shoulders   CERVICAL ROM:   Active ROM A/PROM (deg) eval  Flexion 38   Extension 38*  Right lateral flexion 28  Left lateral flexion 19*  Right rotation 62*  Left rotation 62 (tight)   (Blank rows = not tested)   UPPER EXTREMITY ROM:   Active ROM Right eval Left eval  Shoulder flexion    Shoulder extension    Shoulder abduction    Shoulder adduction    Shoulder internal rotation    Shoulder external rotation    Elbow flexion    Elbow extension    Wrist flexion    Wrist extension    Wrist ulnar deviation    Wrist radial deviation    Wrist pronation    Wrist supination    (Blank rows = not tested)  Shoulder/scapular elevation:   UPPER EXTREMITY MMT:  MMT Right eval Left eval  Shoulder flexion 4+ 4+  Shoulder extension    Shoulder abduction 4-* 4+  Shoulder adduction    Shoulder internal rotation 4+ 4+  Shoulder external rotation 4* (on biceps)  4+  Middle trapezius NT NT  Lower trapezius NT NT  Elbow flexion    Elbow extension    Wrist flexion    Wrist extension    Wrist ulnar deviation    Wrist radial deviation    Wrist pronation    Wrist supination    Grip strength (lbs)    (Blank rows = not tested)  SHOULDER SPECIAL TESTS: Impingement tests: Painful arc test: negative Rotator cuff assessment: Infraspinatus test: positive  and Lateral Jobe: positive  Biceps assessment: Speed's  test: NT  JOINT MOBILITY TESTING:  NT  PALPATION:  TTP right upper trap                                                                                                                             TREATMENT DATE:  10/18/23  THEREX  Shoulder abduction with yellow band 1x10  Shoulder abduction with red band 2x10 AAROM cervical rotation with towel 1x10 with 3 sec hold   PATIENT EDUCATION: Education details: Form and technique for correct performance of exercise and explanation about exam findings   Person educated: Patient Education method: Programmer, multimedia, Demonstration, Verbal cues, and Handouts Education comprehension: verbalized understanding, returned demonstration, and needs further education  HOME EXERCISE PROGRAM: Access Code: [email or web address] URL: [email or web address] Date: 10/18/2023 Prepared by: Consuelo Pandy  Exercises - Standing Single Arm Shoulder Abduction with Resistance  - 1 x daily - 3-4 x weekly - 2 sets - 10 reps - Seated Assisted Cervical Rotation with Towel  - 1 x daily - 3-4 x weekly - 2 sets - 10 reps - 3 sec hold  ASSESSMENT:  CLINICAL IMPRESSION: Patient is a 73 y.o. female who was seen today for physical therapy evaluation and treatment of right-sided neck and shoulder pain. Pt shows decreased cervical mobility and shoulder strength and increased cervical and shoulder pain. The location of pain and limitations in cervical rotation and lateral flexion are suggestive of right upper trapezius involvement. Severe trapezius strain or full tear are less likely due to the lack of strength deficits in flexion and scapular elevation and ability to perform cervical range of motion, but tenderness and tightness with pain are consistent with injury to the muscle such as a lower grade strain. She did have a positive lateral jobe test and painful resisted external rotation which are consistent with supraspinatus and infraspinatus pathology. Pt did have pain  localized around mid biceps with resisted ER, but did not perform Speed's test to completely rule out bicep's tendinopathy. Despite these positive tests, she had full shoulder AROM without pain decreasing the chances of rotator cuff tears or severe tendinopathies. Pt will benefit from skilled PT to increase cervical mobility and shoulder strength to decrease the pain in her upper trapezius and to be able to work at the computer without increased pain.    OBJECTIVE IMPAIRMENTS: decreased activity tolerance, decreased mobility, decreased ROM, decreased strength, hypomobility, postural dysfunction, and pain.   ACTIVITY LIMITATIONS: lifting, sitting, and sleeping  PARTICIPATION LIMITATIONS: driving, community activity, and occupation  PERSONAL FACTORS: Age, Fitness, Past/current experiences, Time since onset of injury/illness/exacerbation, Transportation, and 3+ comorbidities: obesity, HTN, OA  are also affecting patient's functional outcome.   REHAB POTENTIAL: Excellent  CLINICAL DECISION MAKING: Stable/uncomplicated  EVALUATION COMPLEXITY: Low  GOALS: Goals reviewed with patient? No  SHORT TERM GOALS: Target date: 11/01/2023   Pt will be independent with HEP in order to improve strength and decrease pain in order to improve pain-free function at home and work.  Baseline: NT Goal status: INITIAL  LONG TERM GOALS: Target date: 12/27/2023  Pt will decrease their NDI score by at least 10% to indicate a significant decrease in neck disability.  Baseline: 16% Goal status: INITIAL  2.  Pt will increase strength of shoulder abduction and external rotation by at least 1/3 MMT grade (4- to 4) in order to demonstrate improvement in strength and function   Baseline: Shoulder ABD L/R 4+/4-*, Shoulder ER L/R 4+/4* Goal status: INITIAL  3.  Pt will increase cervical range of motion to be within 10% of normative values in be able to complete everyday activities that require neck movement without  an increase of pain.  Baseline:  Active ROM A/PROM (deg) eval  Flexion 38   Extension 38*  Right lateral flexion 28  Left lateral flexion 19*  Right rotation 62*  Left rotation 62 (tight)   Goal status: INITIAL  4.  Pt will increase periscapular strength MMT score by at least 1/3 grade (4- to 4) to indicate improved posture to decrease pain while sitting at the computer.  Baseline: NT Goal status: INITIAL   PLAN: PT FREQUENCY: 2x/week  PT DURATION: 10 weeks  PLANNED INTERVENTIONS: 97110-Therapeutic exercises, 97530- Therapeutic activity, 97112- Neuromuscular re-education, 97535- Self Care, 62952- Manual therapy, Patient/Family education, Joint mobilization, Joint manipulation, Cryotherapy, Moist heat, and Biofeedback  PLAN FOR NEXT SESSION: Warmup with UBE. Speed's test and middle/lower trap MMT. Scapular retractions. Weighted shrugs.   [employer], SPT [employer] DPT  Ellin Goodie PT, DPT  [facility] [address] [place], [address] Phone: [phone]   Fax:  [phone]

## 2023-10-23 ENCOUNTER — Ambulatory Visit: Payer: Medicare Other | Admitting: Physical Therapy

## 2023-10-23 DIAGNOSIS — S46811A Strain of other muscles, fascia and tendons at shoulder and upper arm level, right arm, initial encounter: Secondary | ICD-10-CM | POA: Diagnosis not present

## 2023-10-23 DIAGNOSIS — M25511 Pain in right shoulder: Secondary | ICD-10-CM | POA: Diagnosis not present

## 2023-10-23 DIAGNOSIS — M542 Cervicalgia: Secondary | ICD-10-CM

## 2023-10-23 NOTE — Therapy (Addendum)
 OUTPATIENT PHYSICAL THERAPY UPPER EXTREMITY TREATMENT    Patient Name: Mary Wolfe MRN: [id number] DOB:13-Aug-1951, 73 y.o., female Today's Date: 10/23/2023  END OF SESSION:  PT End of Session - 10/23/23 1436     Visit Number 2    Number of Visits 20    Date for PT Re-Evaluation 12/27/23    Authorization Type Medicare 2025    Authorization - Visit Number 2    Authorization - Number of Visits 20    Progress Note Due on Visit 10    PT Start Time 1345    PT Stop Time 1430    PT Time Calculation (min) 45 min    Activity Tolerance Patient tolerated treatment well    Behavior During Therapy WFL for tasks assessed/performed              Past Medical History:  Diagnosis Date   Bursitis    Diverticulitis    Hyperlipidemia    Hypertension    Iron deficiency anemia    OA (osteoarthritis)    Obesity    PONV (postoperative nausea and vomiting)    Past Surgical History:  Procedure Laterality Date   CARPAL TUNNEL RELEASE Right    CESAREAN SECTION     COLONOSCOPY  03/14/2008   COLONOSCOPY WITH PROPOFOL N/A 06/25/2018   Procedure: COLONOSCOPY WITH PROPOFOL;  Surgeon: Christena Deem, MD;  Location: [facility] ENDOSCOPY;  Service: Endoscopy;  Laterality: N/A;   ESOPHAGOGASTRODUODENOSCOPY  04/01/1997   ESOPHAGOGASTRODUODENOSCOPY (EGD) WITH PROPOFOL N/A 06/25/2018   Procedure: ESOPHAGOGASTRODUODENOSCOPY (EGD) WITH PROPOFOL;  Surgeon: Christena Deem, MD;  Location: [facility] ENDOSCOPY;  Service: Endoscopy;  Laterality: N/A;   removed glass from elbow     from accident   TONSILLECTOMY     TOTAL HIP ARTHROPLASTY Right 04/16/2019   Procedure: Right Anterior Total Hip Arthroplasty;  Surgeon: Marcene Corning, MD;  Location: [facility] ORS;  Service: Orthopedics;  Laterality: Right;   Patient Active Problem List   Diagnosis Date Noted   URI with cough and congestion 10/06/2023   Trapezius strain 09/18/2023   Thumb injury 11/30/2022   Fall 11/30/2022   Prediabetes 06/26/2019   Greater  trochanteric bursitis of right hip 01/21/2019   Primary osteoarthritis of right hip 04/01/2016   Obesity 04/01/2016   Essential hypertension 01/31/2016   Hyperlipidemia 01/31/2016    PCP: Glori Luis, MD  REFERRING PROVIDER: Glori Luis, MD  REFERRING DIAG: [phone] (ICD-10-CM) - Strain of right trapezius muscle, initial encounter   THERAPY DIAG:  Neck pain on right side  Acute pain of right shoulder  Rationale for Evaluation and Treatment: Rehabilitation  ONSET DATE: End of 2024   SUBJECTIVE:  SUBJECTIVE STATEMENT: Pt reports that she is feeling increased pain in her left shoulder today. She has a history of left shoulder arthritis, but this has not been an issue as of late. She is not experiencing as much pain in her right shoulder as of this date.  Hand dominance: Left  PERTINENT HISTORY: Pt says she fell on the left side a year or two ago, but that went away on its own. Recently, she has been having right neck/shoulder pain that started insidiously, but she fell on the 25th going up the steps on the right shoulder which increased that pain. She says she feels more limitation and pain with neck movement, more than shoulder. She went on pain medication and it has been feeling better. 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   PAIN:  Are you having pain? Yes: NPRS scale: 3/10 (8/10 worst) Pain location: Left shoulder  Pain description: aching Aggravating factors: sleeping, sitting at the computer  Relieving factors: pain medication, icing (but doesn't like the cold)   PRECAUTIONS: None  RED FLAGS: Cervical red flags: Dysphagia No, Diplopia No, and  Nausea No   WEIGHT BEARING RESTRICTIONS: No  FALLS:  Has patient fallen in last 6 months? Yes, 1 fall going up the steps   LIVING ENVIRONMENT: Lives with: lives with their family Lives in: House/apartment Stairs: Yes: Internal: 14 steps; can reach both Has following equipment at home: None  OCCUPATION: Diplomatic Services operational officer, works twice a week   PLOF: Independent  PATIENT GOALS: To be pain-free  NEXT MD VISIT: Not scheduled yet   OBJECTIVE:  Note: Objective measures were completed at Evaluation unless otherwise noted.  Vitals: BP 123/67 HR 92  SpO2 100  DIAGNOSTIC FINDINGS:  CLINICAL DATA:  Chronic shoulder pain after a fall 2 years ago.   EXAM: LEFT SHOULDER - 2+ VIEW   COMPARISON:  None.   FINDINGS: There is no evidence of fracture or dislocation. There are moderate acromioclavicular and glenohumeral degenerative changes. High-riding humeral head raising the possibility of rotator cuff pathology. Regional soft tissues are unremarkable.   IMPRESSION: 1. Moderate degenerative changes in the left shoulder. 2. High-riding humeral head raising the possibility of rotator cuff pathology.     Electronically Signed   By: Emmaline Kluver M.D.   On: 04/08/2020 14:17    PATIENT SURVEYS :  NDI 16%  COGNITION: Overall cognitive status: Within functional limits for tasks assessed     SENSATION: WFL  POSTURE: Forward head, rounded shoulders   CERVICAL ROM:   Active ROM A/PROM (deg) eval  Flexion 38   Extension 38*  Right lateral flexion 28  Left lateral flexion 19*  Right rotation 62*  Left rotation 62 (tight)   (Blank rows = not tested)   UPPER EXTREMITY ROM:   Active ROM Right eval Left eval  Shoulder flexion    Shoulder extension    Shoulder abduction    Shoulder adduction    Shoulder internal rotation    Shoulder external rotation    Elbow flexion    Elbow extension    Wrist flexion    Wrist extension    Wrist ulnar deviation    Wrist radial  deviation    Wrist pronation    Wrist supination    (Blank rows = not tested)  Shoulder/scapular elevation:   UPPER  EXTREMITY MMT:  MMT Right eval Left eval  Shoulder flexion 4+ 4+  Shoulder extension    Shoulder abduction 4-* 4+  Shoulder adduction    Shoulder internal rotation 4+ 4+  Shoulder external rotation 4* (on biceps)  4+  Middle trapezius NT NT  Lower trapezius NT NT  Elbow flexion    Elbow extension    Wrist flexion    Wrist extension    Wrist ulnar deviation    Wrist radial deviation    Wrist pronation    Wrist supination    Grip strength (lbs)    (Blank rows = not tested)  SHOULDER SPECIAL TESTS: Impingement tests: Painful arc test: negative Rotator cuff assessment: Infraspinatus test: positive  and Lateral Jobe: positive  Biceps assessment: Speed's test: NT  JOINT MOBILITY TESTING:  NT  PALPATION:  TTP right upper trap                                                                                                                             TREATMENT DATE:  10/23/23: All single UE exercises performed on LUE   THEREX   UBE seat at 10 with resistance at 2 for 2.5 min forward and 2.5 min backward  Standing Shoulder Abduction with yellow band on LUE 1 x 10  Standing Shoulder Abduction with #1 DB to 160 deg 1 x 10  -Pt reports increased pain along AC joint  Standing Shoulder Abduction with #1 DB to 90 deg 1 x 10  -Pt reports increased pain along AC joint  Standing Shoulder Abduction with flexed elbow to 90 deg with # 1 DB 1 x 10  Standing Shoulder Abduction Isometric with 3 sec hold 1 x 10  -Pt reports increased pain in left anterior shoulder  -min VC to step closer to wall to avoid pain exacerbation Standing Shoulder Abduction Isometric with 3 sec hold 1 x 10  -Pt reports no increase in pain now that she is closer to wall Shoulder Abduction AAROM Pulleys on LUE 1 x 10  -min VC to increase abduction to near end range of motion  Shoulder Abduction  AAROM Pulleys on LUE 1 x 10  -Pt able to reach 160 abduction without an increase in her pain   SHOULDER MMT   Lower Trap R/L 4-/4-, Mid Trap R/L 4-/4-, Romboid R/L 4-/4-,  Shoulder Ext R/L 4/4 Shoulder ER at 0 deg abduction with yellow band 1 x 10  Shoulder ER at 0 deg abduction with red band 1 x 10   10/18/23  THEREX  Shoulder abduction with yellow band 1x10  Shoulder abduction with red band 2x10 AAROM cervical rotation with towel 1x10 with 3 sec hold   PATIENT EDUCATION: Education details: Form and technique for correct performance of exercise and explanation about exam findings   Person educated: Patient Education method: Programmer, multimedia, Demonstration, Verbal cues, and Handouts Education comprehension: verbalized understanding, returned demonstration, and needs further education  HOME EXERCISE PROGRAM: Access  Code: [email or web address] URL: [email or web address] Date: 10/18/2023 Prepared by: Consuelo Pandy  Exercises - Standing Single Arm Shoulder Abduction with Resistance  - 1 x daily - 3-4 x weekly - 2 sets - 10 reps - Seated Assisted Cervical Rotation with Towel  - 1 x daily - 3-4 x weekly - 2 sets - 10 reps - 3 sec hold  ASSESSMENT:  CLINICAL IMPRESSION: Pt shows improvement with right shoulder with decreased pain response to activity. However, she is limited by pain in left shoulder now with sign and symptoms indicative of rotator cuff pathology with pain elicited by AROM and not by PROM. HEP modified to include exercises she is sable to perform within decreased pain response including isometrics and AAROM exercise. She does show decreased periscapular strength and she will benefit from further strengthening to improve postural support to improve bilateral shoulder function. She will benefit from skilled PT to increase cervical mobility and shoulder strength to decrease the pain in her upper trapezius and to be able to work at the computer without increased pain.      OBJECTIVE IMPAIRMENTS: decreased activity tolerance, decreased mobility, decreased ROM, decreased strength, hypomobility, postural dysfunction, and pain.   ACTIVITY LIMITATIONS: lifting, sitting, and sleeping  PARTICIPATION LIMITATIONS: driving, community activity, and occupation  PERSONAL FACTORS: Age, Fitness, Past/current experiences, Time since onset of injury/illness/exacerbation, Transportation, and 3+ comorbidities: obesity, HTN, OA  are also affecting patient's functional outcome.   REHAB POTENTIAL: Excellent  CLINICAL DECISION MAKING: Stable/uncomplicated  EVALUATION COMPLEXITY: Low  GOALS: Goals reviewed with patient? No  SHORT TERM GOALS: Target date: 11/01/2023   Pt will be independent with HEP in order to improve strength and decrease pain in order to improve pain-free function at home and work.  Baseline: NT 10/23/23: Performing independently  Goal status: ONGOING    LONG TERM GOALS: Target date: 12/27/2023  Pt will decrease their NDI score by at least 10% to indicate a significant decrease in neck disability.  Baseline: 16% Goal status: ONGOING   2.  Pt will increase strength of shoulder abduction and external rotation by at least 1/3 MMT grade (4- to 4) in order to demonstrate improvement in strength and function   Baseline: Shoulder ABD L/R 4+/4-*, Shoulder ER L/R 4+/4* Goal status: ONGOING   3.  Pt will increase cervical range of motion to be within 10% of normative values in be able to complete everyday activities that require neck movement without an increase of pain.  Baseline:  Active ROM A/PROM (deg) eval  Flexion 38   Extension 38*  Right lateral flexion 28  Left lateral flexion 19*  Right rotation 62*  Left rotation 62 (tight)   Goal status: ONGOING   4.  Pt will increase periscapular strength MMT score by at least 1/3 grade (4- to 4) to indicate improved posture to decrease pain while sitting at the computer.  Baseline:Lower Trap R/L  4-/4-, Mid Trap R/L 4-/4-, Romboid R/L 4-/4-,  Shoulder Ext R/L 4/4 Goal status: ONGOING    PLAN: PT FREQUENCY: 2x/week  PT DURATION: 10 weeks  PLANNED INTERVENTIONS: 97110-Therapeutic exercises, 97530- Therapeutic activity, 97112- Neuromuscular re-education, 97535- Self Care, [id number]- Manual therapy, Patient/Family education, Joint mobilization, Joint manipulation, Cryotherapy, Moist heat, and Biofeedback  PLAN FOR NEXT SESSION: Speed's test. Warmup with UBE. Further shoulder and periscapular strengthening: Horizontal Abduction at 90 degrees.   Ellin Goodie PT, DPT  [facility] [address], [place], [address] Phone: [phone]   Fax:  [phone]

## 2023-10-25 ENCOUNTER — Ambulatory Visit: Payer: Medicare Other | Admitting: Physical Therapy

## 2023-10-30 ENCOUNTER — Ambulatory Visit: Payer: Medicare Other | Admitting: Physical Therapy

## 2023-11-01 ENCOUNTER — Encounter: Payer: Self-pay | Admitting: Physical Therapy

## 2023-11-01 ENCOUNTER — Ambulatory Visit: Payer: Medicare Other

## 2023-11-01 DIAGNOSIS — S46811A Strain of other muscles, fascia and tendons at shoulder and upper arm level, right arm, initial encounter: Secondary | ICD-10-CM | POA: Diagnosis not present

## 2023-11-01 DIAGNOSIS — M542 Cervicalgia: Secondary | ICD-10-CM | POA: Diagnosis not present

## 2023-11-01 DIAGNOSIS — M25511 Pain in right shoulder: Secondary | ICD-10-CM

## 2023-11-01 NOTE — Therapy (Signed)
 OUTPATIENT PHYSICAL THERAPY UPPER EXTREMITY TREATMENT    Patient Name: Mary Wolfe MRN: [id number] DOB:02-Sep-1951, 73 y.o., female Today's Date: 11/01/2023  END OF SESSION:  PT End of Session - 11/01/23 0940     Visit Number 3    Number of Visits 20    Date for PT Re-Evaluation 12/27/23    Authorization Type Medicare 2025    Authorization - Visit Number 3    Authorization - Number of Visits 20    Progress Note Due on Visit 10    PT Start Time [phone]    Activity Tolerance Patient tolerated treatment well    Behavior During Therapy [facility] for tasks assessed/performed              Past Medical History:  Diagnosis Date   Bursitis    Diverticulitis    Hyperlipidemia    Hypertension    Iron deficiency anemia    OA (osteoarthritis)    Obesity    PONV (postoperative nausea and vomiting)    Past Surgical History:  Procedure Laterality Date   CARPAL TUNNEL RELEASE Right    CESAREAN SECTION     COLONOSCOPY  03/14/2008   COLONOSCOPY WITH PROPOFOL N/A 06/25/2018   Procedure: COLONOSCOPY WITH PROPOFOL;  Surgeon: Christena Deem, MD;  Location: [facility] ENDOSCOPY;  Service: Endoscopy;  Laterality: N/A;   ESOPHAGOGASTRODUODENOSCOPY  04/01/1997   ESOPHAGOGASTRODUODENOSCOPY (EGD) WITH PROPOFOL N/A 06/25/2018   Procedure: ESOPHAGOGASTRODUODENOSCOPY (EGD) WITH PROPOFOL;  Surgeon: Christena Deem, MD;  Location: [facility] ENDOSCOPY;  Service: Endoscopy;  Laterality: N/A;   removed glass from elbow     from accident   TONSILLECTOMY     TOTAL HIP ARTHROPLASTY Right 04/16/2019   Procedure: Right Anterior Total Hip Arthroplasty;  Surgeon: Marcene Corning, MD;  Location: [facility] ORS;  Service: Orthopedics;  Laterality: Right;   Patient Active Problem List   Diagnosis Date Noted   URI with cough and congestion 10/06/2023   Trapezius strain 09/18/2023   Thumb injury 11/30/2022   Fall 11/30/2022   Prediabetes 06/26/2019   Greater trochanteric bursitis of right hip 01/21/2019   Primary  osteoarthritis of right hip 04/01/2016   Obesity 04/01/2016   Essential hypertension 01/31/2016   Hyperlipidemia 01/31/2016    PCP: Glori Luis, MD  REFERRING PROVIDER: Glori Luis, MD  REFERRING DIAG: [phone] (ICD-10-CM) - Strain of right trapezius muscle, initial encounter   THERAPY DIAG:  Neck pain on right side  Acute pain of right shoulder  Rationale for Evaluation and Treatment: Rehabilitation  ONSET DATE: End of 2024   SUBJECTIVE:  SUBJECTIVE STATEMENT: Pt says that her right shoulder hasn't been bothering her recently but the left shoulder has been aggravated. She says she is coming in with no pain today and that typically it gets irritated with exercise.    PERTINENT HISTORY: Pt says she fell on the left side a year or two ago, but that went away on its own. Recently, she has been having right neck/shoulder pain that started insidiously, but she fell on the 25th going up the steps on the right shoulder which increased that pain. She says she feels more limitation and pain with neck movement, more than shoulder. She went on pain medication and it has been feeling better. 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   Hand dominance: Left  PAIN:  Are you having pain? No pain upon arrival today  PRECAUTIONS: None  RED FLAGS: Cervical red flags: Dysphagia No, Diplopia No, and Nausea No   WEIGHT BEARING RESTRICTIONS: No  FALLS:  Has patient fallen in last 6 months? Yes, 1 fall going up the steps   LIVING ENVIRONMENT: Lives with: lives with their family Lives in: House/apartment Stairs: Yes: Internal: 14 steps; can reach both Has following  equipment at home: None  OCCUPATION: [employer] operational officer, works twice a week   PLOF: Independent  PATIENT GOALS: To be pain-free  NEXT MD VISIT: Not scheduled yet   OBJECTIVE:  Note: Objective measures were completed at Evaluation unless otherwise noted.  Vitals: BP 123/67 HR 92  SpO2 100  DIAGNOSTIC FINDINGS:  CLINICAL DATA:  Chronic shoulder pain after a fall 2 years ago.   EXAM: LEFT SHOULDER - 2+ VIEW   COMPARISON:  None.   FINDINGS: There is no evidence of fracture or dislocation. There are moderate acromioclavicular and glenohumeral degenerative changes. High-riding humeral head raising the possibility of rotator cuff pathology. Regional soft tissues are unremarkable.   IMPRESSION: 1. Moderate degenerative changes in the left shoulder. 2. High-riding humeral head raising the possibility of rotator cuff pathology.    Electronically Signed   By: Emmaline Kluver M.D.   On: 04/08/2020 14:17    PATIENT SURVEYS :  NDI 16%  COGNITION: Overall cognitive status: Within functional limits for tasks assessed     SENSATION: WFL  POSTURE: Forward head, rounded shoulders   CERVICAL ROM:   Active ROM A/PROM (deg) eval  Flexion 38   Extension 38*  Right lateral flexion 28  Left lateral flexion 19*  Right rotation 62*  Left rotation 62 (tight)   (Blank rows = not tested)    UPPER EXTREMITY MMT:  MMT Right eval Left eval Left 11/01/23  Shoulder flexion 4+ 4+ 3+*  Shoulder extension     Shoulder abduction 4-* 4+ 4*  Shoulder adduction     Shoulder internal rotation 4+ 4+ 4+  Shoulder external rotation 4* (on biceps)  4+ 4*   Middle trapezius NT NT   Lower trapezius NT NT   (Blank rows = not tested)  SHOULDER SPECIAL TESTS: Impingement tests: Painful arc test: negative Rotator cuff assessment: Infraspinatus test: positive  and Lateral Jobe: positive  Biceps assessment: Speed's test: L positive R negative   JOINT MOBILITY TESTING:  NT  PALPATION:  TTP  right upper trap  TREATMENT DATE: 11/01/23  TherEx  UBE seat at 10 for 2.5 min forward and 2.5 min backwards  L shoulder flexion with #2 DB with focus on eccentric 2x10   -started to feel anterior shoulder pain at end of second set on way up L shoulder flexion with #2 DB with focus on eccentric 1x10  -pt instructed to use right UE to bring arm up  Scapular retraction with red band 1x10  Scapular retraction with green band 1x10 Scapular retraction with blue band 1x10 OMEGA seated rows #10 1x10  OMEGA seated rows #25 1x5  - felt pain in anterior shoulder  OMEGA seated rows #20 2x15  Straight arm shoulder extension with red band 1x10  Straight arm shoulder extension with green band 2x15  Serratus wall slides with foam roller 1x10 Serratus wall slides with foam roller and yellow band 2x10   Physical Performance  Speeds Test: positive on left  Assess L shoulder MMT  10/23/23: All single UE exercises performed on LUE  THEREX   UBE seat at 10 with resistance at 2 for 2.5 min forward and 2.5 min backward  Standing Shoulder Abduction with yellow band on LUE 1 x 10  Standing Shoulder Abduction with #1 DB to 160 deg 1 x 10  -Pt reports increased pain along AC joint  Standing Shoulder Abduction with #1 DB to 90 deg 1 x 10  -Pt reports increased pain along AC joint  Standing Shoulder Abduction with flexed elbow to 90 deg with # 1 DB 1 x 10  Standing Shoulder Abduction Isometric with 3 sec hold 1 x 10  -Pt reports increased pain in left anterior shoulder  -min VC to step closer to wall to avoid pain exacerbation Standing Shoulder Abduction Isometric with 3 sec hold 1 x 10  -Pt reports no increase in pain now that she is closer to wall Shoulder Abduction AAROM Pulleys on LUE 1 x 10  -min VC to increase abduction to near end range of motion  Shoulder Abduction AAROM  Pulleys on LUE 1 x 10  -Pt able to reach 160 abduction without an increase in her pain   SHOULDER MMT   Lower Trap R/L 4-/4-, Mid Trap R/L 4-/4-, Romboid R/L 4-/4-,  Shoulder Ext R/L 4/4 Shoulder ER at 0 deg abduction with yellow band 1 x 10  Shoulder ER at 0 deg abduction with red band 1 x 10   10/18/23  THEREX  Shoulder abduction with yellow band 1x10  Shoulder abduction with red band 2x10 AAROM cervical rotation with towel 1x10 with 3 sec hold   PATIENT EDUCATION: Education details: Form and technique for correct performance of exercise and explanation about exam findings   Person educated: Patient Education method: Programmer, multimedia, Demonstration, Verbal cues, and Handouts Education comprehension: verbalized understanding, returned demonstration, and needs further education  HOME EXERCISE PROGRAM: Access Code: [email or web address] URL: [email or web address] Date: 11/01/2023 Prepared by: Consuelo Pandy  Exercises - Seated Shoulder Abduction AAROM with Pulley at Side  - 1 x daily - 2 sets - 10 reps - Seated Assisted Cervical Rotation with Towel  - 3-4 x weekly - 2 sets - 10 reps - 3 sec hold - Standing Single Arm Shoulder Abduction with Resistance  - 1 x daily - 3-4 x weekly - 2 sets - 10 reps - Isometric Shoulder Abduction at Wall  - 3-4 x weekly - 3 sets - 10 reps - 3 sec  hold - Shoulder External Rotation and Scapular Retraction with Resistance  - 3-4  x weekly - 3 sets - 10 reps  ASSESSMENT:  CLINICAL IMPRESSION: Pt had no increase of right upper trap symptoms during UE strengthening exercises today. Today is 2nd visit that pt has arrived with reported new Left shoulder pain, hence screening of arm takes place. Exam shows increased left shoulder pain with AROM above around 160 degrees flexion and abduction but is able to complete exercises within this range. Pt shows decreased left shoulder strength and increased pain compared to evaluation. 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 If Left shoulder problem persists will ask referring provider for new referral to evaluate and treat this shoulder as well. She will benefit from skilled PT to treat her strength and mobility deficits to decrease the pain in her upper trapezius to be able to work at the computer and complete ADLs without increased pain.   OBJECTIVE IMPAIRMENTS: decreased activity tolerance, decreased mobility, decreased ROM, decreased strength, hypomobility, postural dysfunction, and pain.   ACTIVITY LIMITATIONS: lifting, sitting, and sleeping  PARTICIPATION LIMITATIONS: driving, community activity, and occupation  PERSONAL FACTORS: Age, Fitness, Past/current experiences, Time since onset of injury/illness/exacerbation, Transportation, and 3+ comorbidities: obesity, HTN, OA  are also affecting patient's functional outcome.   REHAB POTENTIAL: Excellent  CLINICAL DECISION MAKING: Stable/uncomplicated  EVALUATION COMPLEXITY: Low  GOALS: Goals reviewed with patient? No  SHORT TERM GOALS: Target date: 11/01/2023   Pt will be independent with HEP in order to improve strength and decrease pain in order to improve pain-free function at home and work.  Baseline: NT 10/23/23: Performing independently  Goal status: ACHIEVED     LONG TERM GOALS: Target date: 12/27/2023  Pt will decrease their NDI score by at least 10% to indicate a significant decrease in neck disability.  Baseline: 16% Goal status: ONGOING   2.  Pt will increase strength of shoulder abduction and external rotation by at least 1/3 MMT grade (4- to 4) in order to demonstrate improvement in strength and function   Baseline: Shoulder ABD L/R 4+/4-*, Shoulder ER L/R 4+/4* Goal status: ONGOING   3.  Pt will increase cervical range of motion to be within 10% of normative values in be able to complete everyday activities that require neck  movement without an increase of pain.  Baseline:  Active ROM A/PROM (deg) eval  Flexion 38   Extension 38*  Right lateral flexion 28  Left lateral flexion 19*  Right rotation 62*  Left rotation 62 (tight)   Goal status: ONGOING   4.  Pt will increase periscapular strength MMT score by at least 1/3 grade (4- to 4) to indicate improved posture to decrease pain while sitting at the computer.  Baseline:Lower Trap R/L 4-/4-, Mid Trap R/L 4-/4-, Romboid R/L 4-/4-,  Shoulder Ext R/L 4/4 Goal status: ONGOING    PLAN: PT FREQUENCY: 2x/week  PT DURATION: 10 weeks  PLANNED INTERVENTIONS: 97110-Therapeutic exercises, 97530- Therapeutic activity, 97112- Neuromuscular re-education, 97535- Self Care, 29562- Manual therapy, Patient/Family education, Joint mobilization, Joint manipulation, Cryotherapy, Moist heat, and Biofeedback  PLAN FOR NEXT SESSION: Further shoulder and periscapular strengthening. Try horizontal abduction at 90 degrees possibly one arm at a time. Shoulder flexion isometrics. Shoulder shrugs. Assess right shoulder for progress if continues to be painless.   [employer], SPT [place] DPT  3:34 PM, 11/01/23 Rosamaria Lints, PT, DPT Physical Therapist - [place] [phone] (Office)

## 2023-11-06 ENCOUNTER — Encounter: Payer: Self-pay | Admitting: Physical Therapy

## 2023-11-06 ENCOUNTER — Ambulatory Visit: Payer: Medicare Other | Attending: Family Medicine | Admitting: Physical Therapy

## 2023-11-06 ENCOUNTER — Telehealth: Payer: Self-pay

## 2023-11-06 DIAGNOSIS — G8929 Other chronic pain: Secondary | ICD-10-CM | POA: Diagnosis not present

## 2023-11-06 DIAGNOSIS — M25511 Pain in right shoulder: Secondary | ICD-10-CM | POA: Diagnosis not present

## 2023-11-06 DIAGNOSIS — M25512 Pain in left shoulder: Secondary | ICD-10-CM | POA: Insufficient documentation

## 2023-11-06 DIAGNOSIS — M542 Cervicalgia: Secondary | ICD-10-CM | POA: Diagnosis not present

## 2023-11-06 NOTE — Therapy (Addendum)
 OUTPATIENT PHYSICAL THERAPY UPPER EXTREMITY TREATMENT    Patient Name: Mary Wolfe MRN: [id number] DOB:[birth year], 73 y.o., female Today's Date: 11/06/2023  END OF SESSION:  PT End of Session - 11/06/23 1017     Visit Number 4    Number of Visits 20    Date for PT Re-Evaluation 12/27/23    Authorization Type Medicare 2025    Authorization - Visit Number 4    Authorization - Number of Visits 20    Progress Note Due on Visit 10    PT Start Time 1030    PT Stop Time 1100    PT Time Calculation (min) 30 min    Activity Tolerance Patient tolerated treatment well    Behavior During Therapy WFL for tasks assessed/performed              Past Medical History:  Diagnosis Date   Bursitis    Diverticulitis    Hyperlipidemia    Hypertension    Iron deficiency anemia    OA (osteoarthritis)    Obesity    PONV (postoperative nausea and vomiting)    Past Surgical History:  Procedure Laterality Date   CARPAL TUNNEL RELEASE Right    CESAREAN SECTION     COLONOSCOPY  03/14/2008   COLONOSCOPY WITH PROPOFOL N/A 06/25/2018   Procedure: COLONOSCOPY WITH PROPOFOL;  Surgeon: Christena Deem, MD;  Location: [facility] ENDOSCOPY;  Service: Endoscopy;  Laterality: N/A;   ESOPHAGOGASTRODUODENOSCOPY  04/01/1997   ESOPHAGOGASTRODUODENOSCOPY (EGD) WITH PROPOFOL N/A 06/25/2018   Procedure: ESOPHAGOGASTRODUODENOSCOPY (EGD) WITH PROPOFOL;  Surgeon: Christena Deem, MD;  Location: [facility] ENDOSCOPY;  Service: Endoscopy;  Laterality: N/A;   removed glass from elbow     from accident   TONSILLECTOMY     TOTAL HIP ARTHROPLASTY Right 04/16/2019   Procedure: Right Anterior Total Hip Arthroplasty;  Surgeon: Marcene Corning, MD;  Location: [facility] ORS;  Service: Orthopedics;  Laterality: Right;   Patient Active Problem List   Diagnosis Date Noted   URI with cough and congestion 10/06/2023   Trapezius strain 09/18/2023   Thumb injury 11/30/2022   Fall 11/30/2022   Prediabetes 06/26/2019   Greater  trochanteric bursitis of right hip 01/21/2019   Primary osteoarthritis of right hip 04/01/2016   Obesity 04/01/2016   Essential hypertension 01/31/2016   Hyperlipidemia 01/31/2016    PCP: Glori Luis, MD  REFERRING PROVIDER: Glori Luis, MD  REFERRING DIAG: [phone] (ICD-10-CM) - Strain of right trapezius muscle, initial encounter   THERAPY DIAG:  Neck pain on right side  Acute pain of right shoulder  Rationale for Evaluation and Treatment: Rehabilitation  ONSET DATE: End of 2024   SUBJECTIVE:  SUBJECTIVE STATEMENT: Pt reports gradual improvement in her left shoulder and ongoing pain relief in her right shoulder. Her PCP has since moved onto another practice and she is awaiting to another PCP and she will have them send referral for left shoulder. She reports not waking up with her neck hurting anymore.    PERTINENT HISTORY: Pt says she fell on the left side a year or two ago, but that went away on its own. Recently, she has been having right neck/shoulder pain that started insidiously, but she fell on the 25th going up the steps on the right shoulder which increased that pain. She says she feels more limitation and pain with neck movement, more than shoulder. She went on pain medication and it has been feeling better. 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   Hand dominance: Left  PAIN:  Are you having pain? No pain upon arrival today  PRECAUTIONS: None  RED FLAGS: Cervical red flags: Dysphagia No, Diplopia No, and Nausea No   WEIGHT BEARING RESTRICTIONS: No  FALLS:  Has patient fallen in last 6 months? Yes, 1 fall going up the steps   LIVING  ENVIRONMENT: Lives with: lives with their family Lives in: House/apartment Stairs: Yes: Internal: 14 steps; can reach both Has following equipment at home: None  OCCUPATION: Diplomatic Services operational officer, works twice a week   PLOF: Independent  PATIENT GOALS: To be pain-free  NEXT MD VISIT: Not scheduled yet   OBJECTIVE:  Note: Objective measures were completed at Evaluation unless otherwise noted.  Vitals: BP 123/67 HR 92  SpO2 100  DIAGNOSTIC FINDINGS:  CLINICAL DATA:  Chronic shoulder pain after a fall 2 years ago.   EXAM: LEFT SHOULDER - 2+ VIEW   COMPARISON:  None.   FINDINGS: There is no evidence of fracture or dislocation. There are moderate acromioclavicular and glenohumeral degenerative changes. High-riding humeral head raising the possibility of rotator cuff pathology. Regional soft tissues are unremarkable.   IMPRESSION: 1. Moderate degenerative changes in the left shoulder. 2. High-riding humeral head raising the possibility of rotator cuff pathology.    Electronically Signed   By: Emmaline Kluver M.D.   On: 04/08/2020 14:17    PATIENT SURVEYS :  NDI 16%  COGNITION: Overall cognitive status: Within functional limits for tasks assessed     SENSATION: WFL  POSTURE: Forward head, rounded shoulders   CERVICAL ROM:   Active ROM A/PROM (deg) eval  Flexion 38   Extension 38*  Right lateral flexion 28  Left lateral flexion 19*  Right rotation 62*  Left rotation 62 (tight)   (Blank rows = not tested)    UPPER EXTREMITY MMT:  MMT Right eval Left eval Left 11/01/23  Shoulder flexion 4+ 4+ 3+*  Shoulder extension     Shoulder abduction 4-* 4+ 4*  Shoulder adduction     Shoulder internal rotation 4+ 4+ 4+  Shoulder external rotation 4* (on biceps)  4+ 4*   Middle trapezius NT NT   Lower trapezius NT NT   (Blank rows = not tested)  SHOULDER SPECIAL TESTS: Impingement tests: Painful arc test: negative Rotator cuff assessment: Infraspinatus test:  positive  and Lateral Jobe: positive  Biceps assessment: Speed's test: L positive R negative   JOINT MOBILITY TESTING:  NT  PALPATION:  TTP right upper trap  TREATMENT DATE:  11/06/23: PHYSICAL PERFORMANCE   Active ROM A/PROM (deg) eval A/PROM (deg) 11/06/23  Flexion 38  45  Extension 38* 45  Right lateral flexion 28 45  Left lateral flexion 19* 45 (slight pull)  Right rotation 62* 60  Left rotation 62 (tight) 60    Shoulder MMT  - Abduction R/L 4+/4+ -ER at 90 deg abduc R/L 4+/4+ -Flex R/L 4+/4* Periscapular MMT: -Rhomboids R/L 4/4 -Mid Trap R/L 4/4   THEREX: All single UE exercises performed on LUE  UBE seat at 10 for 2.5 min forward and 2.5 min backwards  Supine Shoulder Abduction in Supine PROM to 130 deg 1 x 5  Supine Shoulder Abduction in Supine AROM to 130 deg 1 x 10  Supine Shoulder Abduction in Supine with # 1 DB 1 x 10  -Pt reports increased anterior shoulder pain  Bilateral Shoulder External Rotation at 0 deg abduction with red band 1 x 10  Bilateral Shoulder External Rotation at 0 deg abduction with green band 1 x 10  Shoulder Horizontal Abduction at 90 deg abduction with extended elbow 1 x 10  -Pt reports increased left shoulder pain  Shoulder Horizontal Abduction at 90 deg abduction with flexed elbow 1 x 10     11/01/23  TherEx  UBE seat at 10 for 2.5 min forward and 2.5 min backwards  L shoulder flexion with #2 DB with focus on eccentric 2x10   -started to feel anterior shoulder pain at end of second set on way up L shoulder flexion with #2 DB with focus on eccentric 1x10  -pt instructed to use right UE to bring arm up  Scapular retraction with red band 1x10  Scapular retraction with green band 1x10 Scapular retraction with blue band 1x10 OMEGA seated rows #10 1x10  OMEGA seated rows #25 1x5  - felt pain in anterior shoulder   OMEGA seated rows #20 2x15  Straight arm shoulder extension with red band 1x10  Straight arm shoulder extension with green band 2x15  Serratus wall slides with foam roller 1x10 Serratus wall slides with foam roller and yellow band 2x10   Physical Performance  Speeds Test: positive on left  Assess L shoulder MMT  10/23/23: All single UE exercises performed on LUE  THEREX   UBE seat at 10 with resistance at 2 for 2.5 min forward and 2.5 min backward  Standing Shoulder Abduction with yellow band on LUE 1 x 10  Standing Shoulder Abduction with #1 DB to 160 deg 1 x 10  -Pt reports increased pain along AC joint  Standing Shoulder Abduction with #1 DB to 90 deg 1 x 10  -Pt reports increased pain along AC joint  Standing Shoulder Abduction with flexed elbow to 90 deg with # 1 DB 1 x 10  Standing Shoulder Abduction Isometric with 3 sec hold 1 x 10  -Pt reports increased pain in left anterior shoulder  -min VC to step closer to wall to avoid pain exacerbation Standing Shoulder Abduction Isometric with 3 sec hold 1 x 10  -Pt reports no increase in pain now that she is closer to wall Shoulder Abduction AAROM Pulleys on LUE 1 x 10  -min VC to increase abduction to near end range of motion  Shoulder Abduction AAROM Pulleys on LUE 1 x 10  -Pt able to reach 160 abduction without an increase in her pain   SHOULDER MMT   Lower Trap R/L 4-/4-, Mid Trap R/L 4-/4-, Romboid R/L 4-/4-,  Shoulder  Ext R/L 4/4 Shoulder ER at 0 deg abduction with yellow band 1 x 10  Shoulder ER at 0 deg abduction with red band 1 x 10   10/18/23  THEREX  Shoulder abduction with yellow band 1x10  Shoulder abduction with red band 2x10 AAROM cervical rotation with towel 1x10 with 3 sec hold   PATIENT EDUCATION: Education details: Form and technique for correct performance of exercise and explanation about exam findings   Person educated: Patient Education method: Programmer, multimedia, Demonstration, Verbal cues, and  Handouts Education comprehension: verbalized understanding, returned demonstration, and needs further education  HOME EXERCISE PROGRAM: Access Code: [email or web address] URL: [email or web address] Date: 11/06/2023 Prepared by: Ellin Goodie  Exercises - Seated Shoulder Abduction AAROM with Pulley at Side  - 1 x daily - 2 sets - 10 reps - Standing Single Arm Shoulder Abduction with Resistance  - 3-4 x weekly - 3 sets - 10 reps - Standing Shoulder Flexion with Resistance  - 3-4 x weekly - 3 sets - 10 reps - Shoulder External Rotation and Scapular Retraction with Resistance  - 3-4 x weekly - 3 sets - 10 reps - Seated Shoulder Horizontal Abduction - Thumbs Up  - 3-4 x weekly - 3 sets - 10 reps   ASSESSMENT:  CLINICAL IMPRESSION: Pt demonstrates progress towards goals with an improvement in right shoulder neck function, ROM, and shoulder and periscapular strength. She has nearly met all her goals with the exception of improving lower trap strength. PT to reach out to new PCP about sending a referral for the left shoulder as soon as possible to begin care plan for the left shoulder and to close out current plan of care. She will continue to benefit from skilled PT to treat her strength and mobility deficits to decrease the pain in her upper trapezius to be able to work at the computer and complete ADLs without increased pain.   OBJECTIVE IMPAIRMENTS: decreased activity tolerance, decreased mobility, decreased ROM, decreased strength, hypomobility, postural dysfunction, and pain.   ACTIVITY LIMITATIONS: lifting, sitting, and sleeping  PARTICIPATION LIMITATIONS: driving, community activity, and occupation  PERSONAL FACTORS: Age, Fitness, Past/current experiences, Time since onset of injury/illness/exacerbation, Transportation, and 3+ comorbidities: obesity, HTN, OA  are also affecting patient's functional outcome.   REHAB POTENTIAL: Excellent  CLINICAL DECISION MAKING:  Stable/uncomplicated  EVALUATION COMPLEXITY: Low  GOALS: Goals reviewed with patient? No  SHORT TERM GOALS: Target date: 11/01/2023   Pt will be independent with HEP in order to improve strength and decrease pain in order to improve pain-free function at home and work.  Baseline: NT 10/23/23: Performing independently  Goal status: ACHIEVED     LONG TERM GOALS: Target date: 12/27/2023  Pt will decrease their NDI score by at least 10% to indicate a significant decrease in neck disability.  Baseline: 16%  11/06/23: 6% Goal status: ACHIEVED   2.  Pt will increase strength of shoulder abduction and external rotation by at least 1/3 MMT grade (4- to 4) in order to demonstrate improvement in strength and function   Baseline: Shoulder ABD L/R 4+/4-*, Shoulder ER L/R 4+/4* 11/06/23: Shoulder ABD L/R 4+/4+, Shoulder ER L/R 4+/4+ Goal status: ACHIEVED   3.  Pt will increase cervical range of motion to be within 10% of normative values in be able to complete everyday activities that require neck movement without an increase of pain.  Baseline:  Active ROM A/PROM (deg) eval  Flexion 38   Extension 38*  Right lateral flexion 28  Left lateral flexion  19*  Right rotation 62*  Left rotation 62 (tight)   11/06/23:   Active ROM A/PROM (deg) eval A/PROM (deg) 11/06/23  Flexion 38  45  Extension 38* 45  Right lateral flexion 28 45  Left lateral flexion 19* 45 (slight pull)  Right rotation 62* 60  Left rotation 62 (tight) 60    Goal status: ACHIEVED   4.  Pt will increase periscapular strength MMT score by at least 1/3 grade (4- to 4) to indicate improved posture to decrease pain while sitting at the computer.  Baseline:Lower Trap R/L 4-/4-, Mid Trap R/L 4-/4-, Romboid R/L 4-/4-,  Shoulder Ext R/L 4/4  11/06/23: Lower Trap R/L 4/4-, Mid Trap R/L 4/4-, Romboid R/L 4/4,  Shoulder Ext R/L 4+ /4+ Goal status: ONGOING    PLAN: PT FREQUENCY: 2x/week  PT DURATION: 10 weeks  PLANNED INTERVENTIONS:  97110-Therapeutic exercises, 97530- Therapeutic activity, 97112- Neuromuscular re-education, 97535- Self Care, 16109- Manual therapy, Patient/Family education, Joint mobilization, Joint manipulation, Cryotherapy, Moist heat, and Biofeedback  PLAN FOR NEXT SESSION: Continue to progress periscapular strengthening exercises especially lower trap exercises.   Ellin Goodie PT, DPT  [facility] [address] [place], [address] Phone: [phone]   Fax:  [phone]

## 2023-11-06 NOTE — Telephone Encounter (Signed)
 Noted.

## 2023-11-06 NOTE — Telephone Encounter (Signed)
 Copied from CRM [id number]. Topic: General - Call Back - No Documentation >> Nov 06, 2023 12:24 PM Florestine Avers wrote: Reason for CRM: Patient states that her physical therapist will be sending over a order request for the patient. She just wanted to call in and let Dr. Clent Ridges know.

## 2023-11-07 ENCOUNTER — Other Ambulatory Visit: Payer: Self-pay

## 2023-11-07 DIAGNOSIS — S46811A Strain of other muscles, fascia and tendons at shoulder and upper arm level, right arm, initial encounter: Secondary | ICD-10-CM

## 2023-11-08 ENCOUNTER — Ambulatory Visit: Payer: Medicare Other | Admitting: Physical Therapy

## 2023-11-08 ENCOUNTER — Encounter: Payer: Self-pay | Admitting: Physical Therapy

## 2023-11-08 DIAGNOSIS — M542 Cervicalgia: Secondary | ICD-10-CM

## 2023-11-08 DIAGNOSIS — M25512 Pain in left shoulder: Secondary | ICD-10-CM | POA: Diagnosis not present

## 2023-11-08 DIAGNOSIS — M25511 Pain in right shoulder: Secondary | ICD-10-CM

## 2023-11-08 DIAGNOSIS — G8929 Other chronic pain: Secondary | ICD-10-CM | POA: Diagnosis not present

## 2023-11-08 NOTE — Therapy (Signed)
 OUTPATIENT PHYSICAL THERAPY UPPER EXTREMITY TREATMENT    Patient Name: Mary Wolfe MRN: [id number] DOB:1951-01-22, 73 y.o., female Today's Date: 11/08/2023  END OF SESSION:  PT End of Session - 11/08/23 0959     Visit Number 5    Number of Visits 20    Date for PT Re-Evaluation 12/27/23    Authorization Type Medicare 2025    Authorization - Visit Number 5    Authorization - Number of Visits 20    Progress Note Due on Visit 10    PT Start Time 0950    PT Stop Time 1030    PT Time Calculation (min) 40 min    Activity Tolerance Patient tolerated treatment well    Behavior During Therapy WFL for tasks assessed/performed               Past Medical History:  Diagnosis Date   Bursitis    Diverticulitis    Hyperlipidemia    Hypertension    Iron deficiency anemia    OA (osteoarthritis)    Obesity    PONV (postoperative nausea and vomiting)    Past Surgical History:  Procedure Laterality Date   CARPAL TUNNEL RELEASE Right    CESAREAN SECTION     COLONOSCOPY  03/14/2008   COLONOSCOPY WITH PROPOFOL N/A 06/25/2018   Procedure: COLONOSCOPY WITH PROPOFOL;  Surgeon: Christena Deem, MD;  Location: [facility] ENDOSCOPY;  Service: Endoscopy;  Laterality: N/A;   ESOPHAGOGASTRODUODENOSCOPY  04/01/1997   ESOPHAGOGASTRODUODENOSCOPY (EGD) WITH PROPOFOL N/A 06/25/2018   Procedure: ESOPHAGOGASTRODUODENOSCOPY (EGD) WITH PROPOFOL;  Surgeon: Christena Deem, MD;  Location: [facility] ENDOSCOPY;  Service: Endoscopy;  Laterality: N/A;   removed glass from elbow     from accident   TONSILLECTOMY     TOTAL HIP ARTHROPLASTY Right 04/16/2019   Procedure: Right Anterior Total Hip Arthroplasty;  Surgeon: Marcene Corning, MD;  Location: [facility] ORS;  Service: Orthopedics;  Laterality: Right;   Patient Active Problem List   Diagnosis Date Noted   URI with cough and congestion 10/06/2023   Trapezius strain 09/18/2023   Thumb injury 11/30/2022   Fall 11/30/2022   Prediabetes 06/26/2019   Greater  trochanteric bursitis of right hip 01/21/2019   Primary osteoarthritis of right hip 04/01/2016   Obesity 04/01/2016   Essential hypertension 01/31/2016   Hyperlipidemia 01/31/2016    PCP: Glori Luis, MD  REFERRING PROVIDER: Glori Luis, MD  REFERRING DIAG: [phone] (ICD-10-CM) - Strain of right trapezius muscle, initial encounter   THERAPY DIAG:  Neck pain on right side  Acute pain of right shoulder  Rationale for Evaluation and Treatment: Rehabilitation  ONSET DATE: End of 2024   SUBJECTIVE:  SUBJECTIVE STATEMENT: Pt reports ongoing pain in her left shoulder that has not improved. She continues to have resolved right trap pain and she is awaiting referral from PCP for left shoulder to begin new treatment plan.    PERTINENT HISTORY: Pt says she fell on the left side a year or two ago, but that went away on its own. Recently, she has been having right neck/shoulder pain that started insidiously, but she fell on the 25th going up the steps on the right shoulder which increased that pain. She says she feels more limitation and pain with neck movement, more than shoulder. She went on pain medication and it has been feeling better. 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   Hand dominance: Left  PAIN:  Are you having pain? No pain upon arrival today  PRECAUTIONS: None  RED FLAGS: Cervical red flags: Dysphagia No, Diplopia No, and Nausea No   WEIGHT BEARING RESTRICTIONS: No  FALLS:  Has patient fallen in last 6 months? Yes, 1 fall going up the steps   LIVING ENVIRONMENT: Lives with: lives with their family Lives in: House/apartment Stairs: Yes:  Internal: 14 steps; can reach both Has following equipment at home: None  OCCUPATION: [employer] Services operational officer, works twice a week   PLOF: Independent  PATIENT GOALS: To be pain-free  NEXT MD VISIT: Not scheduled yet   OBJECTIVE:  Note: Objective measures were completed at Evaluation unless otherwise noted.  Vitals: BP 123/67 HR 92  SpO2 100  DIAGNOSTIC FINDINGS:  CLINICAL DATA:  Chronic shoulder pain after a fall 2 years ago.   EXAM: LEFT SHOULDER - 2+ VIEW   COMPARISON:  None.   FINDINGS: There is no evidence of fracture or dislocation. There are moderate acromioclavicular and glenohumeral degenerative changes. High-riding humeral head raising the possibility of rotator cuff pathology. Regional soft tissues are unremarkable.   IMPRESSION: 1. Moderate degenerative changes in the left shoulder. 2. High-riding humeral head raising the possibility of rotator cuff pathology.    Electronically Signed   By: Emmaline Kluver M.D.   On: 04/08/2020 14:17    PATIENT SURVEYS :  NDI 16%  COGNITION: Overall cognitive status: Within functional limits for tasks assessed     SENSATION: WFL  POSTURE: Forward head, rounded shoulders   CERVICAL ROM:   Active ROM A/PROM (deg) eval  Flexion 38   Extension 38*  Right lateral flexion 28  Left lateral flexion 19*  Right rotation 62*  Left rotation 62 (tight)   (Blank rows = not tested)    UPPER EXTREMITY MMT:  MMT Right eval Left eval Left 11/01/23  Shoulder flexion 4+ 4+ 3+*  Shoulder extension     Shoulder abduction 4-* 4+ 4*  Shoulder adduction     Shoulder internal rotation 4+ 4+ 4+  Shoulder external rotation 4* (on biceps)  4+ 4*   Middle trapezius NT NT   Lower trapezius NT NT   (Blank rows = not tested)  SHOULDER SPECIAL TESTS: Impingement tests: Painful arc test: negative Rotator cuff assessment: Infraspinatus test: positive  and Lateral Jobe: positive  Biceps assessment: Speed's test: L positive R negative    JOINT MOBILITY TESTING:  NT  PALPATION:  TTP right upper trap  TREATMENT DATE:  11/08/23: THEREX UBE with seat at 11 2.5 min forward and 2.5 min backward  Shoulder Flexion with #1 DB to 90 degrees 1 x 10  Shoulder Flexion with #2 DB to 90 degrees 1 x 10  Shoulder Flexion with #2 DB to 120 degrees 1 x 10  -Pt reports increased pain in left shoulder  Shoulder Abduction with #1 DB to 90 degrees 2 x 10 Shoulder Abduction with #1 DB to 120 degrees 1 x 10   Wall Push Up with a Plus 3 x 10  -min VC to maintain elbow extension OMEGA Shoulder Rows with #25 1 x 10  -min VC to not retract shoulders past body wall  OMEGA Shoulder Rows with #35 1 x 10  OMEGA Shoulder Rows with #40 1 x 10  OMEGA Lat Pull Down with  #40 3 X 10  -min VC for sequencing of the exercise including position of where to tap bar     11/06/23: PHYSICAL PERFORMANCE   Active ROM A/PROM (deg) eval A/PROM (deg) 11/06/23  Flexion 38  45  Extension 38* 45  Right lateral flexion 28 45  Left lateral flexion 19* 45 (slight pull)  Right rotation 62* 60  Left rotation 62 (tight) 60    Shoulder MMT  - Abduction R/L 4+/4+ -ER at 90 deg abduc R/L 4+/4+ -Flex R/L 4+/4* Periscapular MMT: -Rhomboids R/L 4/4 -Mid Trap R/L 4/4   THEREX: All single UE exercises performed on LUE  UBE seat at 10 for 2.5 min forward and 2.5 min backwards  Supine Shoulder Abduction in Supine PROM to 130 deg 1 x 5  Supine Shoulder Abduction in Supine AROM to 130 deg 1 x 10  Supine Shoulder Abduction in Supine with # 1 DB 1 x 10  -Pt reports increased anterior shoulder pain  Bilateral Shoulder External Rotation at 0 deg abduction with red band 1 x 10  Bilateral Shoulder External Rotation at 0 deg abduction with green band 1 x 10  Shoulder Horizontal Abduction at 90 deg abduction with extended elbow 1 x 10  -Pt reports  increased left shoulder pain  Shoulder Horizontal Abduction at 90 deg abduction with flexed elbow 1 x 10     11/01/23  TherEx  UBE seat at 10 for 2.5 min forward and 2.5 min backwards  L shoulder flexion with #2 DB with focus on eccentric 2x10   -started to feel anterior shoulder pain at end of second set on way up L shoulder flexion with #2 DB with focus on eccentric 1x10  -pt instructed to use right UE to bring arm up  Scapular retraction with red band 1x10  Scapular retraction with green band 1x10 Scapular retraction with blue band 1x10 OMEGA seated rows #10 1x10  OMEGA seated rows #25 1x5  - felt pain in anterior shoulder  OMEGA seated rows #20 2x15  Straight arm shoulder extension with red band 1x10  Straight arm shoulder extension with green band 2x15  Serratus wall slides with foam roller 1x10 Serratus wall slides with foam roller and yellow band 2x10   Physical Performance  Speeds Test: positive on left  Assess L shoulder MMT  10/23/23: All single UE exercises performed on LUE  THEREX   UBE seat at 10 with resistance at 2 for 2.5 min forward and 2.5 min backward  Standing Shoulder Abduction with yellow band on LUE 1 x 10  Standing Shoulder Abduction with #1 DB to 160 deg 1 x 10  -Pt  reports increased pain along AC joint  Standing Shoulder Abduction with #1 DB to 90 deg 1 x 10  -Pt reports increased pain along AC joint  Standing Shoulder Abduction with flexed elbow to 90 deg with # 1 DB 1 x 10  Standing Shoulder Abduction Isometric with 3 sec hold 1 x 10  -Pt reports increased pain in left anterior shoulder  -min VC to step closer to wall to avoid pain exacerbation Standing Shoulder Abduction Isometric with 3 sec hold 1 x 10  -Pt reports no increase in pain now that she is closer to wall Shoulder Abduction AAROM Pulleys on LUE 1 x 10  -min VC to increase abduction to near end range of motion  Shoulder Abduction AAROM Pulleys on LUE 1 x 10  -Pt able to reach 160  abduction without an increase in her pain   SHOULDER MMT   Lower Trap R/L 4-/4-, Mid Trap R/L 4-/4-, Romboid R/L 4-/4-,  Shoulder Ext R/L 4/4 Shoulder ER at 0 deg abduction with yellow band 1 x 10  Shoulder ER at 0 deg abduction with red band 1 x 10   10/18/23  THEREX  Shoulder abduction with yellow band 1x10  Shoulder abduction with red band 2x10 AAROM cervical rotation with towel 1x10 with 3 sec hold   PATIENT EDUCATION: Education details: Form and technique for correct performance of exercise and explanation about exam findings   Person educated: Patient Education method: Programmer, multimedia, Demonstration, Verbal cues, and Handouts Education comprehension: verbalized understanding, returned demonstration, and needs further education  HOME EXERCISE PROGRAM: Access Code: [email or web address] URL: [email or web address] Date: 11/08/2023 Prepared by: Ellin Goodie  Exercises - Seated Shoulder Abduction AAROM with Pulley at Side  - 1 x daily - 2 sets - 10 reps - Standing Single Arm Shoulder Abduction with Resistance  - 3-4 x weekly - 3 sets - 10 reps - Standing Shoulder Flexion with Resistance  - 3-4 x weekly - 3 sets - 10 reps - Shoulder External Rotation and Scapular Retraction with Resistance  - 3-4 x weekly - 3 sets - 10 reps - Seated Shoulder Horizontal Abduction - Thumbs Up  - 3-4 x weekly - 3 sets - 10 reps   ASSESSMENT:  CLINICAL IMPRESSION: Pt continues to show an improvement in periscapular and shoulder strength with ability to perform shoulder strengthening exercises with increased resistance. She was able to perform resistance exercises on the machine for first time indicating an improvement with activity tolerance of bilateral shoulders to activity. She will continue to benefit from skilled PT to treat her strength and mobility deficits to decrease the pain in her upper trapezius to be able to work at the computer and complete ADLs without increased pain.    OBJECTIVE  IMPAIRMENTS: decreased activity tolerance, decreased mobility, decreased ROM, decreased strength, hypomobility, postural dysfunction, and pain.   ACTIVITY LIMITATIONS: lifting, sitting, and sleeping  PARTICIPATION LIMITATIONS: driving, community activity, and occupation  PERSONAL FACTORS: Age, Fitness, Past/current experiences, Time since onset of injury/illness/exacerbation, Transportation, and 3+ comorbidities: obesity, HTN, OA  are also affecting patient's functional outcome.   REHAB POTENTIAL: Excellent  CLINICAL DECISION MAKING: Stable/uncomplicated  EVALUATION COMPLEXITY: Low  GOALS: Goals reviewed with patient? No  SHORT TERM GOALS: Target date: 11/01/2023   Pt will be independent with HEP in order to improve strength and decrease pain in order to improve pain-free function at home and work.  Baseline: NT 10/23/23: Performing independently  Goal status: ACHIEVED     LONG TERM GOALS: Target date:  12/27/2023  Pt will decrease their NDI score by at least 10% to indicate a significant decrease in neck disability.  Baseline: 16%  11/06/23: 6% Goal status: ACHIEVED   2.  Pt will increase strength of shoulder abduction and external rotation by at least 1/3 MMT grade (4- to 4) in order to demonstrate improvement in strength and function   Baseline: Shoulder ABD L/R 4+/4-*, Shoulder ER L/R 4+/4* 11/06/23: Shoulder ABD L/R 4+/4+, Shoulder ER L/R 4+/4+ Goal status: ACHIEVED   3.  Pt will increase cervical range of motion to be within 10% of normative values in be able to complete everyday activities that require neck movement without an increase of pain.  Baseline:  Active ROM A/PROM (deg) eval  Flexion 38   Extension 38*  Right lateral flexion 28  Left lateral flexion 19*  Right rotation 62*  Left rotation 62 (tight)   11/06/23:   Active ROM A/PROM (deg) eval A/PROM (deg) 11/06/23  Flexion 38  45  Extension 38* 45  Right lateral flexion 28 45  Left lateral flexion 19* 45  (slight pull)  Right rotation 62* 60  Left rotation 62 (tight) 60    Goal status: ACHIEVED   4.  Pt will increase periscapular strength MMT score by at least 1/3 grade (4- to 4) to indicate improved posture to decrease pain while sitting at the computer.  Baseline:Lower Trap R/L 4-/4-, Mid Trap R/L 4-/4-, Romboid R/L 4-/4-,  Shoulder Ext R/L 4/4  11/06/23: Lower Trap R/L 4/4-, Mid Trap R/L 4/4-, Romboid R/L 4/4,  Shoulder Ext R/L 4+ /4+ Goal status: ONGOING    PLAN: PT FREQUENCY: 2x/week  PT DURATION: 10 weeks  PLANNED INTERVENTIONS: 97110-Therapeutic exercises, 97530- Therapeutic activity, 97112- Neuromuscular re-education, 97535- Self Care, 16109- Manual therapy, Patient/Family education, Joint mobilization, Joint manipulation, Cryotherapy, Moist heat, and Biofeedback  PLAN FOR NEXT SESSION: Look at strengthening lower trap exercises: lower trap setting at wall and possible PNF D2 flexion and extension   Ellin Goodie PT, DPT  [facility] [address], [place], [address] Phone: [phone]   Fax:  [phone]

## 2023-11-09 ENCOUNTER — Other Ambulatory Visit: Payer: Self-pay

## 2023-11-09 DIAGNOSIS — M25512 Pain in left shoulder: Secondary | ICD-10-CM

## 2023-11-13 ENCOUNTER — Ambulatory Visit: Payer: Medicare Other

## 2023-11-13 ENCOUNTER — Encounter: Payer: Self-pay | Admitting: Physical Therapy

## 2023-11-13 DIAGNOSIS — M25512 Pain in left shoulder: Secondary | ICD-10-CM | POA: Diagnosis not present

## 2023-11-13 DIAGNOSIS — G8929 Other chronic pain: Secondary | ICD-10-CM | POA: Diagnosis not present

## 2023-11-13 DIAGNOSIS — M25511 Pain in right shoulder: Secondary | ICD-10-CM | POA: Diagnosis not present

## 2023-11-13 DIAGNOSIS — M542 Cervicalgia: Secondary | ICD-10-CM | POA: Diagnosis not present

## 2023-11-13 NOTE — Therapy (Signed)
 OUTPATIENT PHYSICAL THERAPY UPPER EXTREMITY TREATMENT    Patient Name: Mary Wolfe MRN: [id number] DOB:January 01, 1951, 73 y.o., female Today's Date: 11/13/2023  END OF SESSION:  PT End of Session - 11/13/23 0942     Visit Number 6    Number of Visits 20    Date for PT Re-Evaluation 12/27/23    Authorization Type Medicare 2025    Authorization - Number of Visits 20    Progress Note Due on Visit 10    PT Start Time 0945    PT Stop Time 1027    PT Time Calculation (min) 42 min    Activity Tolerance Patient tolerated treatment well    Behavior During Therapy WFL for tasks assessed/performed                Past Medical History:  Diagnosis Date   Bursitis    Diverticulitis    Hyperlipidemia    Hypertension    Iron deficiency anemia    OA (osteoarthritis)    Obesity    PONV (postoperative nausea and vomiting)    Past Surgical History:  Procedure Laterality Date   CARPAL TUNNEL RELEASE Right    CESAREAN SECTION     COLONOSCOPY  03/14/2008   COLONOSCOPY WITH PROPOFOL N/A 06/25/2018   Procedure: COLONOSCOPY WITH PROPOFOL;  Surgeon: Christena Deem, MD;  Location: [facility] ENDOSCOPY;  Service: Endoscopy;  Laterality: N/A;   ESOPHAGOGASTRODUODENOSCOPY  04/01/1997   ESOPHAGOGASTRODUODENOSCOPY (EGD) WITH PROPOFOL N/A 06/25/2018   Procedure: ESOPHAGOGASTRODUODENOSCOPY (EGD) WITH PROPOFOL;  Surgeon: Christena Deem, MD;  Location: [facility] ENDOSCOPY;  Service: Endoscopy;  Laterality: N/A;   removed glass from elbow     from accident   TONSILLECTOMY     TOTAL HIP ARTHROPLASTY Right 04/16/2019   Procedure: Right Anterior Total Hip Arthroplasty;  Surgeon: Marcene Corning, MD;  Location: [facility] ORS;  Service: Orthopedics;  Laterality: Right;   Patient Active Problem List   Diagnosis Date Noted   URI with cough and congestion 10/06/2023   Trapezius strain 09/18/2023   Thumb injury 11/30/2022   Fall 11/30/2022   Prediabetes 06/26/2019   Greater trochanteric bursitis of right hip  01/21/2019   Primary osteoarthritis of right hip 04/01/2016   Obesity 04/01/2016   Essential hypertension 01/31/2016   Hyperlipidemia 01/31/2016    PCP: Glori Luis, MD  REFERRING PROVIDER: Glori Luis, MD  REFERRING DIAG: [phone] (ICD-10-CM) - Strain of right trapezius muscle, initial encounter   THERAPY DIAG:  Neck pain on right side  Acute pain of right shoulder  Rationale for Evaluation and Treatment: Rehabilitation  ONSET DATE: End of 2024   SUBJECTIVE:  SUBJECTIVE STATEMENT:   Patient reports her L shoulder is feeling better this date (2-3/10). Hasn't been waking up at night due to pain.   PERTINENT HISTORY: Pt says she fell on the left side a year or two ago, but that went away on its own. Recently, she has been having right neck/shoulder pain that started insidiously, but she fell on the 25th going up the steps on the right shoulder which increased that pain. She says she feels more limitation and pain with neck movement, more than shoulder. She went on pain medication and it has been feeling better. 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   Hand dominance: Left  PAIN:  Are you having pain? No pain upon arrival today  PRECAUTIONS: None  RED FLAGS: Cervical red flags: Dysphagia No, Diplopia No, and Nausea No   WEIGHT BEARING RESTRICTIONS: No  FALLS:  Has patient fallen in last 6 months? Yes, 1 fall going up the steps   LIVING ENVIRONMENT: Lives with: lives with their family Lives in: House/apartment Stairs: Yes: Internal: 14 steps; can reach both Has following equipment at home: None  OCCUPATION: Diplomatic Services operational officer, works twice a week    PLOF: Independent  PATIENT GOALS: To be pain-free  NEXT MD VISIT: Not scheduled yet   OBJECTIVE:  Note: Objective measures were completed at Evaluation unless otherwise noted.  Vitals: BP 123/67 HR 92  SpO2 100  DIAGNOSTIC FINDINGS:  CLINICAL DATA:  Chronic shoulder pain after a fall 2 years ago.   EXAM: LEFT SHOULDER - 2+ VIEW   COMPARISON:  None.   FINDINGS: There is no evidence of fracture or dislocation. There are moderate acromioclavicular and glenohumeral degenerative changes. High-riding humeral head raising the possibility of rotator cuff pathology. Regional soft tissues are unremarkable.   IMPRESSION: 1. Moderate degenerative changes in the left shoulder. 2. High-riding humeral head raising the possibility of rotator cuff pathology.    Electronically Signed   By: Emmaline Kluver M.D.   On: 04/08/2020 14:17    PATIENT SURVEYS :  NDI 16%  COGNITION: Overall cognitive status: Within functional limits for tasks assessed     SENSATION: WFL  POSTURE: Forward head, rounded shoulders   CERVICAL ROM:   Active ROM A/PROM (deg) eval  Flexion 38   Extension 38*  Right lateral flexion 28  Left lateral flexion 19*  Right rotation 62*  Left rotation 62 (tight)   (Blank rows = not tested)    UPPER EXTREMITY MMT:  MMT Right eval Left eval Left 11/01/23  Shoulder flexion 4+ 4+ 3+*  Shoulder extension     Shoulder abduction 4-* 4+ 4*  Shoulder adduction     Shoulder internal rotation 4+ 4+ 4+  Shoulder external rotation 4* (on biceps)  4+ 4*   Middle trapezius NT NT   Lower trapezius NT NT   (Blank rows = not tested)  SHOULDER SPECIAL TESTS: Impingement tests: Painful arc test: negative Rotator cuff assessment: Infraspinatus test: positive  and Lateral Jobe: positive  Biceps assessment: Speed's test: L positive R negative   JOINT MOBILITY TESTING:  NT  PALPATION:  TTP right upper trap  TREATMENT DATE: 11/13/23:  UBE with seat at 11 2.5 min forward and 2.5 min backward  Seated B Shoulder flexion with 2# DB  3 x 10  Seated shoulder abduction with 2# DB 2 x 10 each UE  Corner pec stretch 2 x 30 seconds Seated row with BTB 2 x 15 Seated horizontal abduction with BTB 3 x 10 Wall push up with a plus 2 x 10    11/08/23: THEREX UBE with seat at 11 2.5 min forward and 2.5 min backward  Shoulder Flexion with #1 DB to 90 degrees 1 x 10  Shoulder Flexion with #2 DB to 90 degrees 1 x 10  Shoulder Flexion with #2 DB to 120 degrees 1 x 10  -Pt reports increased pain in left shoulder  Shoulder Abduction with #1 DB to 90 degrees 2 x 10 Shoulder Abduction with #1 DB to 120 degrees 1 x 10   Wall Push Up with a Plus 3 x 10  -min VC to maintain elbow extension OMEGA Shoulder Rows with #25 1 x 10  -min VC to not retract shoulders past body wall  OMEGA Shoulder Rows with #35 1 x 10  OMEGA Shoulder Rows with #40 1 x 10  OMEGA Lat Pull Down with  #40 3 X 10  -min VC for sequencing of the exercise including position of where to tap bar     11/06/23: PHYSICAL PERFORMANCE   Active ROM A/PROM (deg) eval A/PROM (deg) 11/06/23  Flexion 38  45  Extension 38* 45  Right lateral flexion 28 45  Left lateral flexion 19* 45 (slight pull)  Right rotation 62* 60  Left rotation 62 (tight) 60    Shoulder MMT  - Abduction R/L 4+/4+ -ER at 90 deg abduc R/L 4+/4+ -Flex R/L 4+/4* Periscapular MMT: -Rhomboids R/L 4/4 -Mid Trap R/L 4/4   THEREX: All single UE exercises performed on LUE  UBE seat at 10 for 2.5 min forward and 2.5 min backwards  Supine Shoulder Abduction in Supine PROM to 130 deg 1 x 5  Supine Shoulder Abduction in Supine AROM to 130 deg 1 x 10  Supine Shoulder Abduction in Supine with # 1 DB 1 x 10  -Pt reports increased anterior shoulder pain  Bilateral Shoulder External Rotation at 0 deg abduction with  red band 1 x 10  Bilateral Shoulder External Rotation at 0 deg abduction with green band 1 x 10  Shoulder Horizontal Abduction at 90 deg abduction with extended elbow 1 x 10  -Pt reports increased left shoulder pain  Shoulder Horizontal Abduction at 90 deg abduction with flexed elbow 1 x 10     11/01/23  TherEx  UBE seat at 10 for 2.5 min forward and 2.5 min backwards  L shoulder flexion with #2 DB with focus on eccentric 2x10   -started to feel anterior shoulder pain at end of second set on way up L shoulder flexion with #2 DB with focus on eccentric 1x10  -pt instructed to use right UE to bring arm up  Scapular retraction with red band 1x10  Scapular retraction with green band 1x10 Scapular retraction with blue band 1x10 OMEGA seated rows #10 1x10  OMEGA seated rows #25 1x5  - felt pain in anterior shoulder  OMEGA seated rows #20 2x15  Straight arm shoulder extension with red band 1x10  Straight arm shoulder extension with green band 2x15  Serratus wall slides with foam roller 1x10 Serratus wall slides with foam roller and yellow band  2x10   Physical Performance  Speeds Test: positive on left  Assess L shoulder MMT  10/23/23: All single UE exercises performed on LUE  THEREX   UBE seat at 10 with resistance at 2 for 2.5 min forward and 2.5 min backward  Standing Shoulder Abduction with yellow band on LUE 1 x 10  Standing Shoulder Abduction with #1 DB to 160 deg 1 x 10  -Pt reports increased pain along AC joint  Standing Shoulder Abduction with #1 DB to 90 deg 1 x 10  -Pt reports increased pain along AC joint  Standing Shoulder Abduction with flexed elbow to 90 deg with # 1 DB 1 x 10  Standing Shoulder Abduction Isometric with 3 sec hold 1 x 10  -Pt reports increased pain in left anterior shoulder  -min VC to step closer to wall to avoid pain exacerbation Standing Shoulder Abduction Isometric with 3 sec hold 1 x 10  -Pt reports no increase in pain now that she is closer to  wall Shoulder Abduction AAROM Pulleys on LUE 1 x 10  -min VC to increase abduction to near end range of motion  Shoulder Abduction AAROM Pulleys on LUE 1 x 10  -Pt able to reach 160 abduction without an increase in her pain   SHOULDER MMT   Lower Trap R/L 4-/4-, Mid Trap R/L 4-/4-, Romboid R/L 4-/4-,  Shoulder Ext R/L 4/4 Shoulder ER at 0 deg abduction with yellow band 1 x 10  Shoulder ER at 0 deg abduction with red band 1 x 10   10/18/23  THEREX  Shoulder abduction with yellow band 1x10  Shoulder abduction with red band 2x10 AAROM cervical rotation with towel 1x10 with 3 sec hold   PATIENT EDUCATION: Education details: Form and technique for correct performance of exercise and explanation about exam findings   Person educated: Patient Education method: Programmer, multimedia, Demonstration, Verbal cues, and Handouts Education comprehension: verbalized understanding, returned demonstration, and needs further education  HOME EXERCISE PROGRAM: Access Code: [email or web address] URL: [email or web address] Date: 11/08/2023 Prepared by: Ellin Goodie  Exercises - Seated Shoulder Abduction AAROM with Pulley at Side  - 1 x daily - 2 sets - 10 reps - Standing Single Arm Shoulder Abduction with Resistance  - 3-4 x weekly - 3 sets - 10 reps - Standing Shoulder Flexion with Resistance  - 3-4 x weekly - 3 sets - 10 reps - Shoulder External Rotation and Scapular Retraction with Resistance  - 3-4 x weekly - 3 sets - 10 reps - Seated Shoulder Horizontal Abduction - Thumbs Up  - 3-4 x weekly - 3 sets - 10 reps  Access Code: [email or web address] URL: [email or web address] Date: 11/13/2023 Prepared by: Maylon Peppers  Exercises - Corner Pec Major Stretch  - 2-3 x daily - 5-7 x weekly - 3-5 reps - 20-30 second hold - Doorway Pec Stretch at 90 Degrees Abduction  - 2-3 x daily - 5-7 x weekly - 3-5 reps - 20-30 second hold   ASSESSMENT:  CLINICAL IMPRESSION:   Session focused on B UE strengthening  with increased resistance and introduction to pec stretch in corner. Tolerated treatment session well with reduction in pain at end of session. HEP updated with corner pec stretch. She will continue to benefit from skilled PT to treat her strength and mobility deficits to decrease the pain in her upper trapezius to be able to work at the computer and complete ADLs without increased pain.    OBJECTIVE IMPAIRMENTS: decreased activity tolerance, decreased mobility, decreased ROM,  decreased strength, hypomobility, postural dysfunction, and pain.   ACTIVITY LIMITATIONS: lifting, sitting, and sleeping  PARTICIPATION LIMITATIONS: driving, community activity, and occupation  PERSONAL FACTORS: Age, Fitness, Past/current experiences, Time since onset of injury/illness/exacerbation, Transportation, and 3+ comorbidities: obesity, HTN, OA  are also affecting patient's functional outcome.   REHAB POTENTIAL: Excellent  CLINICAL DECISION MAKING: Stable/uncomplicated  EVALUATION COMPLEXITY: Low  GOALS: Goals reviewed with patient? No  SHORT TERM GOALS: Target date: 11/01/2023   Pt will be independent with HEP in order to improve strength and decrease pain in order to improve pain-free function at home and work.  Baseline: NT 10/23/23: Performing independently  Goal status: ACHIEVED     LONG TERM GOALS: Target date: 12/27/2023  Pt will decrease their NDI score by at least 10% to indicate a significant decrease in neck disability.  Baseline: 16%  11/06/23: 6% Goal status: ACHIEVED   2.  Pt will increase strength of shoulder abduction and external rotation by at least 1/3 MMT grade (4- to 4) in order to demonstrate improvement in strength and function   Baseline: Shoulder ABD L/R 4+/4-*, Shoulder ER L/R 4+/4* 11/06/23: Shoulder ABD L/R 4+/4+, Shoulder ER L/R 4+/4+ Goal status: ACHIEVED   3.  Pt will increase cervical range of motion to be within 10% of normative values in be able to complete everyday  activities that require neck movement without an increase of pain.  Baseline:  Active ROM A/PROM (deg) eval  Flexion 38   Extension 38*  Right lateral flexion 28  Left lateral flexion 19*  Right rotation 62*  Left rotation 62 (tight)   11/06/23:   Active ROM A/PROM (deg) eval A/PROM (deg) 11/06/23  Flexion 38  45  Extension 38* 45  Right lateral flexion 28 45  Left lateral flexion 19* 45 (slight pull)  Right rotation 62* 60  Left rotation 62 (tight) 60    Goal status: ACHIEVED   4.  Pt will increase periscapular strength MMT score by at least 1/3 grade (4- to 4) to indicate improved posture to decrease pain while sitting at the computer.  Baseline:Lower Trap R/L 4-/4-, Mid Trap R/L 4-/4-, Romboid R/L 4-/4-,  Shoulder Ext R/L 4/4  11/06/23: Lower Trap R/L 4/4-, Mid Trap R/L 4/4-, Romboid R/L 4/4,  Shoulder Ext R/L 4+ /4+ Goal status: ONGOING    PLAN: PT FREQUENCY: 2x/week  PT DURATION: 10 weeks  PLANNED INTERVENTIONS: 97110-Therapeutic exercises, 97530- Therapeutic activity, 97112- Neuromuscular re-education, 97535- Self Care, 16109- Manual therapy, Patient/Family education, Joint mobilization, Joint manipulation, Cryotherapy, Moist heat, and Biofeedback  PLAN FOR NEXT SESSION: Look at strengthening lower trap exercises: lower trap setting at wall and possible PNF D2 flexion and extension  Maylon Peppers, PT, DPT Physical Therapist - [place]  [facility]

## 2023-11-15 ENCOUNTER — Encounter: Payer: Self-pay | Admitting: Physical Therapy

## 2023-11-15 ENCOUNTER — Ambulatory Visit (INDEPENDENT_AMBULATORY_CARE_PROVIDER_SITE_OTHER): Payer: Medicare PPO | Admitting: *Deleted

## 2023-11-15 ENCOUNTER — Other Ambulatory Visit: Payer: Self-pay | Admitting: Family Medicine

## 2023-11-15 ENCOUNTER — Ambulatory Visit: Payer: Medicare Other | Admitting: Physical Therapy

## 2023-11-15 VITALS — BP 134/76 | Ht 62.5 in | Wt 218.0 lb

## 2023-11-15 DIAGNOSIS — M25511 Pain in right shoulder: Secondary | ICD-10-CM | POA: Diagnosis not present

## 2023-11-15 DIAGNOSIS — M25512 Pain in left shoulder: Secondary | ICD-10-CM | POA: Diagnosis not present

## 2023-11-15 DIAGNOSIS — Z0000 Encounter for general adult medical examination without abnormal findings: Secondary | ICD-10-CM

## 2023-11-15 DIAGNOSIS — G8929 Other chronic pain: Secondary | ICD-10-CM

## 2023-11-15 DIAGNOSIS — Z1231 Encounter for screening mammogram for malignant neoplasm of breast: Secondary | ICD-10-CM

## 2023-11-15 DIAGNOSIS — M542 Cervicalgia: Secondary | ICD-10-CM | POA: Diagnosis not present

## 2023-11-15 NOTE — Progress Notes (Signed)
 Subjective:   Mary Wolfe is a 73 y.o. who presents for a Medicare Wellness preventive visit.  Visit Complete: Virtual I connected with  Kerry Fort on 11/15/23 by a audio enabled telemedicine application and verified that I am speaking with the correct person using two identifiers.  Patient Location: Home  Provider Location: Office/Clinic  I discussed the limitations of evaluation and management by telemedicine. The patient expressed understanding and agreed to proceed.  Vital Signs: Because this visit was a virtual/telehealth visit, some criteria may be missing or patient reported. Any vitals not documented were not able to be obtained and vitals that have been documented are patient reported.  VideoDeclined- This patient declined Librarian, academic. Therefore the visit was completed with audio only.  AWV Questionnaire: No: Patient Medicare AWV questionnaire was not completed prior to this visit.  Cardiac Risk Factors include: advanced age (>44men, >42 women);obesity (BMI >30kg/m2);dyslipidemia;hypertension     Objective:    Today's Vitals   11/15/23 1302  BP: 134/76  Weight: 218 lb (98.9 kg)  Height: 5' 2.5" (1.588 m)   Body mass index is 39.24 kg/m.     11/15/2023    1:14 PM 11/15/2023   10:38 AM 10/18/2023    9:45 AM 11/14/2022    1:45 PM 11/10/2021    3:05 PM 11/03/2020   11:20 AM 11/01/2019   11:09 AM  Advanced Directives  Does Patient Have a Medical Advance Directive? No No No No No No No  Does patient want to make changes to medical advance directive?       No - Patient declined  Would patient like information on creating a medical advance directive? No - Patient declined No - Patient declined  No - Patient declined No - Guardian declined No - Patient declined     Current Medications (verified) Outpatient Encounter Medications as of 11/15/2023  Medication Sig   amLODipine (NORVASC) 10 MG tablet TAKE 1 TABLET(10 MG) BY MOUTH DAILY    cholecalciferol (VITAMIN D3) 25 MCG (1000 UT) tablet Take 1,000 Units by mouth daily.   [DISCONTINUED] amoxicillin-clavulanate (AUGMENTIN) 875-125 MG tablet Take 1 tablet by mouth 2 (two) times daily. (Patient not taking: Reported on 11/15/2023)   No facility-administered encounter medications on file as of 11/15/2023.    Allergies (verified) Nickel   History: Past Medical History:  Diagnosis Date   Bursitis    Diverticulitis    Hyperlipidemia    Hypertension    Iron deficiency anemia    OA (osteoarthritis)    Obesity    PONV (postoperative nausea and vomiting)    Past Surgical History:  Procedure Laterality Date   CARPAL TUNNEL RELEASE Right    CESAREAN SECTION     COLONOSCOPY  03/14/2008   COLONOSCOPY WITH PROPOFOL N/A 06/25/2018   Procedure: COLONOSCOPY WITH PROPOFOL;  Surgeon: Christena Deem, MD;  Location: [facility] ENDOSCOPY;  Service: Endoscopy;  Laterality: N/A;   ESOPHAGOGASTRODUODENOSCOPY  04/01/1997   ESOPHAGOGASTRODUODENOSCOPY (EGD) WITH PROPOFOL N/A 06/25/2018   Procedure: ESOPHAGOGASTRODUODENOSCOPY (EGD) WITH PROPOFOL;  Surgeon: Christena Deem, MD;  Location: [facility] ENDOSCOPY;  Service: Endoscopy;  Laterality: N/A;   removed glass from elbow     from accident   TONSILLECTOMY     TOTAL HIP ARTHROPLASTY Right 04/16/2019   Procedure: Right Anterior Total Hip Arthroplasty;  Surgeon: Marcene Corning, MD;  Location: [facility] ORS;  Service: Orthopedics;  Laterality: Right;   Family History  Problem Relation Age of Onset   Heart disease Other  Arthritis Other    Breast cancer Maternal Aunt 35   Social History   Socioeconomic History   Marital status: Married    Spouse name: Not on file   Number of children: Not on file   Years of education: Not on file   Highest education level: Associate degree: occupational, Scientist, product/process development, or vocational program  Occupational History   Not on file  Tobacco Use   Smoking status: Never   Smokeless tobacco: Never  Vaping Use    Vaping status: Never Used  Substance and Sexual Activity   Alcohol use: No    Alcohol/week: 0.0 standard drinks of alcohol   Drug use: No   Sexual activity: Yes    Birth control/protection: None  Other Topics Concern   Not on file  Social History Narrative   Married   Social Drivers of Health   Financial Resource Strain: Low Risk  (11/15/2023)   Overall Financial Resource Strain (CARDIA)    Difficulty of Paying Living Expenses: Not hard at all  Food Insecurity: No Food Insecurity (11/15/2023)   Hunger Vital Sign    Worried About Running Out of Food in the Last Year: Never true    Ran Out of Food in the Last Year: Never true  Transportation Needs: No Transportation Needs (11/15/2023)   PRAPARE - Administrator, Civil Service (Medical): No    Lack of Transportation (Non-Medical): No  Physical Activity: Inactive (11/15/2023)   Exercise Vital Sign    Days of Exercise per Week: 0 days    Minutes of Exercise per Session: 0 min  Stress: No Stress Concern Present (11/15/2023)   [employer] of Occupational Health - Occupational Stress Questionnaire    Feeling of Stress : Not at all  Social Connections: Socially Integrated (11/15/2023)   Social Connection and Isolation Panel [NHANES]    Frequency of Communication with Friends and Family: More than three times a week    Frequency of Social Gatherings with Friends and Family: More than three times a week    Attends Religious Services: More than 4 times per year    Active Member of [employer] or Organizations: Yes    Attends Engineer, structural: More than 4 times per year    Marital Status: Married    Tobacco Counseling Counseling given: Not Answered    Clinical Intake:  Pre-visit preparation completed: Yes  Pain : No/denies pain     BMI - recorded: 39.24 Nutritional Status: BMI > 30  Obese Nutritional Risks: None Diabetes: No  How often do you need to have someone help you when you read instructions,  pamphlets, or other written materials from your doctor or pharmacy?: 1 - Never  Interpreter Needed?: No  Information entered by :: R. Kymani Laursen LPN   Activities of Daily Living     11/15/2023    1:03 PM  In your present state of health, do you have any difficulty performing the following activities:  Hearing? 0  Vision? 0  Difficulty concentrating or making decisions? 0  Walking or climbing stairs? 0  Dressing or bathing? 0  Doing errands, shopping? 0  Preparing Food and eating ? N  Using the Toilet? N  In the past six months, have you accidently leaked urine? N  Do you have problems with loss of bowel control? N  Managing your Medications? N  Managing your Finances? N  Housekeeping or managing your Housekeeping? N    Patient Care Team: Glori Luis, MD (Inactive) as  PCP - General (Family Medicine)  Indicate any recent Medical Services you may have received from other than [facility] providers in the past year (date may be approximate).     Assessment:   This is a routine wellness examination for [place].  Hearing/Vision screen Hearing Screening - Comments:: No issues Vision Screening - Comments:: glasses   Goals Addressed             This Visit's Progress    Patient Stated       Wants to start a regular exercise program        Depression Screen     11/15/2023    1:09 PM 10/06/2023    8:37 AM 03/15/2023    9:18 AM 11/30/2022    9:58 AM 11/14/2022    1:40 PM 02/16/2022    8:58 AM 11/10/2021    2:59 PM  PHQ 2/9 Scores  PHQ - 2 Score 0 0 0 0 0 0 0  PHQ- 9 Score 0 4 0 0       Fall Risk     11/15/2023    1:06 PM 10/06/2023    8:37 AM 03/15/2023    9:18 AM 11/30/2022    9:58 AM 11/14/2022    1:46 PM  Fall Risk   Falls in the past year? 1 1 0 1 0  Number falls in past yr: 0 0 0 0 0  Injury with Fall? 1 1 0 0 0  Comment shoulder      Risk for fall due to : History of fall(s);Impaired balance/gait History of fall(s) No Fall Risks History of fall(s)   Follow up  Falls evaluation completed;Falls prevention discussed Falls evaluation completed Falls evaluation completed Falls evaluation completed Falls evaluation completed;Falls prevention discussed    MEDICARE RISK AT HOME:  Medicare Risk at Home Any stairs in or around the home?: Yes If so, are there any without handrails?: No Home free of loose throw rugs in walkways, pet beds, electrical cords, etc?: Yes Adequate lighting in your home to reduce risk of falls?: Yes Life alert?: No Use of a cane, walker or w/c?: No Grab bars in the bathroom?: No Shower chair or bench in shower?: No Elevated toilet seat or a handicapped toilet?: No  TIMED UP AND GO:  Was the test performed?  No  Cognitive Function: 6CIT completed        11/15/2023    1:15 PM 11/14/2022    1:47 PM 11/10/2021    3:06 PM 11/01/2019   11:25 AM  6CIT Screen  What Year? 0 points 0 points 0 points 0 points  What month? 0 points 0 points 0 points 0 points  What time? 0 points 0 points  0 points  Count back from 20 0 points 0 points  0 points  Months in reverse 0 points 0 points 0 points 0 points  Repeat phrase 0 points 2 points  0 points  Total Score 0 points 2 points  0 points    Immunizations Immunization History  Administered Date(s) Administered   PFIZER(Purple Top)SARS-COV-2 Vaccination 12/27/2019, 01/17/2020   Tdap 06/24/2016   Zoster, Live 06/24/2016    Screening Tests Health Maintenance  Topic Date Due   MAMMOGRAM  04/15/2022   COVID-19 Vaccine (3 - 2024-25 season) 05/07/2023   INFLUENZA VACCINE  12/04/2023 (Originally 04/06/2023)   Medicare Annual Wellness (AWV)  11/14/2024   DTaP/Tdap/Td (2 - Td or Tdap) 06/24/2026   Colonoscopy  06/25/2028   DEXA SCAN  Completed  Hepatitis C Screening  Completed   HPV VACCINES  Aged Out   Pneumonia Vaccine 67+ Years old  Discontinued   Zoster Vaccines- Shingrix  Discontinued    Health Maintenance  Health Maintenance Due  Topic Date Due   MAMMOGRAM  04/15/2022    COVID-19 Vaccine (3 - 2024-25 season) 05/07/2023   Health Maintenance Items Addressed: Patient was advised that Mammogram order was placed 11/30/22 and she needs to call and schedule an appointment and telephone number was given.Patient declines Bone density at this time and wants to talk with new PCP about it at office visit  Patient declines vaccines.   Additional Screening:  Vision Screening: Recommended annual ophthalmology exams for early detection of glaucoma and other disorders of the eye. Up to date  [facility] Screening: Recommended annual dental exams for proper oral hygiene  Community Resource Referral / Chronic Care Management: CRR required this visit?  No   CCM required this visit?  No     Plan:     I have personally reviewed and noted the following in the patient's chart:   Medical and social history Use of alcohol, tobacco or illicit drugs  Current medications and supplements including opioid prescriptions. Patient is not currently taking opioid prescriptions. Functional ability and status Nutritional status Physical activity Advanced directives List of other physicians Hospitalizations, surgeries, and ER visits in previous 12 months Vitals Screenings to include cognitive, depression, and falls Referrals and appointments  In addition, I have reviewed and discussed with patient certain preventive protocols, quality metrics, and best practice recommendations. A written personalized care plan for preventive services as well as general preventive health recommendations were provided to patient.     Sydell Axon, LPN   1/61/0960   After Visit Summary: (MyChart) Due to this being a telephonic visit, the after visit summary with patients personalized plan was offered to patient via MyChart   Notes: Nothing significant to report at this time.

## 2023-11-15 NOTE — Patient Instructions (Signed)
 Mary Wolfe , Thank you for taking time to come for your Medicare Wellness Visit. I appreciate your ongoing commitment to your health goals. Please review the following plan we discussed and let me know if I can assist you in the future.   Referrals/Orders/Follow-Ups/Clinician Recommendations: Remember to call and schedule your mammogram that was ordered 11/30/22. Consider updating your vaccines. You have an order for:  []   2D Mammogram  [x]   3D Mammogram  []   Bone Density     Please call for appointment:  [facility]  [address] [place] [address] [phone]   Make sure to wear two-piece clothing.  No lotions, powders, or deodorants the day of the appointment. Make sure to bring picture ID and insurance card.  Bring list of medications you are currently taking including any supplements.    This is a list of the screening recommended for you and due dates:  Health Maintenance  Topic Date Due   Mammogram  04/15/2022   COVID-19 Vaccine (3 - 2024-25 season) 05/07/2023   Flu Shot  12/04/2023*   Medicare Annual Wellness Visit  11/14/2024   DTaP/Tdap/Td vaccine (2 - Td or Tdap) 06/24/2026   Colon Cancer Screening  06/25/2028   DEXA scan (bone density measurement)  Completed   Hepatitis C Screening  Completed   HPV Vaccine  Aged Out   Pneumonia Vaccine  Discontinued   Zoster (Shingles) Vaccine  Discontinued  *Topic was postponed. The date shown is not the original due date.    Advanced directives: (Declined) Advance directive discussed with you today. Even though you declined this today, please call our office should you change your mind, and we can give you the proper paperwork for you to fill out.  Next Medicare Annual Wellness Visit scheduled for next year: Yes 11/20/24 @ 1:40

## 2023-11-15 NOTE — Therapy (Unsigned)
 OUTPATIENT PHYSICAL THERAPY SHOULDER EVALUATION   Patient Name: Mary Wolfe MRN: [id number] DOB:[birth year], 73 y.o., female Today's Date: 11/15/2023  END OF SESSION:  PT End of Session - 11/15/23 1311     Visit Number 1    Number of Visits 15    Date for PT Re-Evaluation 01/03/24    Authorization Type Medicare 2025    Authorization - Visit Number 1    Authorization - Number of Visits 15    Progress Note Due on Visit 10    PT Start Time 1030    PT Stop Time 1115    PT Time Calculation (min) 45 min    Activity Tolerance Patient tolerated treatment well    Behavior During Therapy WFL for tasks assessed/performed             Past Medical History:  Diagnosis Date   Bursitis    Diverticulitis    Hyperlipidemia    Hypertension    Iron deficiency anemia    OA (osteoarthritis)    Obesity    PONV (postoperative nausea and vomiting)    Past Surgical History:  Procedure Laterality Date   CARPAL TUNNEL RELEASE Right    CESAREAN SECTION     COLONOSCOPY  03/14/2008   COLONOSCOPY WITH PROPOFOL N/A 06/25/2018   Procedure: COLONOSCOPY WITH PROPOFOL;  Surgeon: Christena Deem, MD;  Location: [facility] ENDOSCOPY;  Service: Endoscopy;  Laterality: N/A;   ESOPHAGOGASTRODUODENOSCOPY  04/01/1997   ESOPHAGOGASTRODUODENOSCOPY (EGD) WITH PROPOFOL N/A 06/25/2018   Procedure: ESOPHAGOGASTRODUODENOSCOPY (EGD) WITH PROPOFOL;  Surgeon: Christena Deem, MD;  Location: [facility] ENDOSCOPY;  Service: Endoscopy;  Laterality: N/A;   removed glass from elbow     from accident   TONSILLECTOMY     TOTAL HIP ARTHROPLASTY Right 04/16/2019   Procedure: Right Anterior Total Hip Arthroplasty;  Surgeon: Marcene Corning, MD;  Location: [facility] ORS;  Service: Orthopedics;  Laterality: Right;   Patient Active Problem List   Diagnosis Date Noted   URI with cough and congestion 10/06/2023   Trapezius strain 09/18/2023   Thumb injury 11/30/2022   Fall 11/30/2022   Prediabetes 06/26/2019   Greater trochanteric  bursitis of right hip 01/21/2019   Primary osteoarthritis of right hip 04/01/2016   Obesity 04/01/2016   Essential hypertension 01/31/2016   Hyperlipidemia 01/31/2016    PCP: Dr. Marikay Alar   REFERRING PROVIDER: Dr. Freddie Apley   REFERRING DIAG: [phone] (ICD-10-CM) - Left shoulder pain, unspecified chronicity   THERAPY DIAG:  Chronic left shoulder pain  Rationale for Evaluation and Treatment: Rehabilitation  ONSET DATE: 10/07/23   SUBJECTIVE:  SUBJECTIVE STATEMENT: See pertinent history  Hand dominance: Left  PERTINENT HISTORY: Pt states that she started to develop left shoulder pain after coming to physical therapy for right trap strain. She did experience a fall in 2021 where she fell on her left side. She did experience a lot of pain on her left side initially but the pain subsided until recently. She is able to sleep without pain or discomfort.   PAIN:  Are you having pain? Yes: NPRS scale: 2/10  Pain location: Anterior surface of left shoulder  Pain description: Achy  Aggravating factors: Reaching overhead.  Relieving factors: Shoulder Stretches   PRECAUTIONS: None  RED FLAGS: None   WEIGHT BEARING RESTRICTIONS: No  FALLS:  Has patient fallen in last 6 months? Yes. 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   NEXT MD VISIT:   OBJECTIVE:  Note: Objective measures were completed at Evaluation unless otherwise  noted.  VITALS: BP 134/73 HR 83 SpO2 98%  DIAGNOSTIC FINDINGS:  CLINICAL DATA:  Chronic shoulder pain after a fall 2 years ago.   EXAM: LEFT SHOULDER - 2+ VIEW   COMPARISON:  None.   FINDINGS: There is no evidence of fracture or dislocation. There are moderate acromioclavicular and glenohumeral degenerative changes. High-riding humeral head raising the possibility of rotator cuff pathology. Regional soft tissues are unremarkable.   IMPRESSION: 1. Moderate degenerative changes in the left shoulder. 2. High-riding humeral head raising the possibility of rotator cuff pathology.     Electronically Signed   By: Emmaline Kluver M.D.   On: 04/08/2020 14:17  PATIENT SURVEYS:  Quick Dash 14% symptom score and  0% Work Disability   COGNITION: Overall cognitive status: Within functional limits for tasks assessed     SENSATION: WFL  POSTURE: Forward head and rounded shoulders   UPPER EXTREMITY ROM:   Active ROM Right eval Left eval  Shoulder flexion 160 160   Shoulder extension    Shoulder abduction 160 160   Shoulder adduction    Shoulder internal rotation    Shoulder external rotation    Elbow flexion    Elbow extension    Wrist flexion    Wrist extension    Wrist ulnar deviation    Wrist radial deviation    Wrist pronation    Wrist supination    (Blank rows = not tested)  UPPER EXTREMITY MMT:  MMT Right eval Left eval  Shoulder flexion 4+ 4+  Shoulder extension    Shoulder abduction 4+ 4+  Shoulder adduction    Shoulder internal rotation 4+ 4+  Shoulder external rotation 4 4*  Middle trapezius 4 4  Lower trapezius 4- 4-  Elbow flexion 4+ 4+  Elbow extension    Wrist flexion    Wrist extension    Wrist ulnar deviation    Wrist radial deviation    Wrist pronation    Wrist supination    Grip strength (lbs)    (Blank rows = not tested)  SHOULDER SPECIAL TESTS: Impingement tests: Neer impingement test: positive  and Painful arc test: positive   Rotator cuff assessment: Drop arm test: negative, Empty can test:  negative, Full can test: negative, External rotation lag sign: negative, Belly press test: negative, and Infraspinatus test: negative Biceps assessment: Speed's test: positive    PALPATION:  Not performed                                                                                                                              TREATMENT DATE:   11/15/23:  THEREX  Eccentric left shoulder flexion with red band 1 x 10  -Pt reports increased right shoulder pain at 30 deg flexion  -Mod VC to decrease speed of exercise  Eccentric left shoulder flexion with yellow band 1 x 10  Doorway pec stretch 2 x 30 sec    PATIENT EDUCATION: Education details: Form and technique for correct performance of exercise and explanation about underlying pathology of left shoulder  Person educated: Patient Education method: Programmer, multimedia, Demonstration, Verbal cues, and Handouts Education comprehension: verbalized understanding, returned demonstration, and verbal cues required  HOME EXERCISE PROGRAM: Access Code: [email or web address] URL: [email or web address] Date: 11/15/2023 Prepared by: Ellin Goodie  Exercises - Seated Shoulder Abduction AAROM with Pulley at Side  - 1 x daily - 2 sets - 10 reps - Standing Shoulder Flexion with Resistance  - 3-4 x weekly - 3 sets - 10 reps - Shoulder External Rotation and Scapular Retraction with Resistance  - 3-4 x weekly - 3 sets - 10 reps - Corner Pec Major Stretch  - 1 x daily - 3 reps - 30-60 sec  hold  ASSESSMENT:  CLINICAL IMPRESSION: Patient is a 73 y.o. AA female who was seen today for physical therapy evaluation and treatment for left shoulder pain that developed over the past two months. She shows signs and symptoms indicative of left shoulder impingement with bicep's tendinitis as evidenced by increased pain with overhead movement and internal rotation and increased pain with resisted  shoulder flexion and supination. She demonstrates a few deficits that include decreased left shoulder and periscapular strength and increased left shoulder pain with overhead movement. She will benefit from skilled PT to address these aforementioned deficits to return to using dominant hand for lifting and overhead activities without excessive pain and discomfort.   OBJECTIVE IMPAIRMENTS: decreased ROM and pain.   ACTIVITY LIMITATIONS: carrying, lifting, and reach over head  PARTICIPATION LIMITATIONS: cleaning, laundry, shopping, and community activity  PERSONAL FACTORS: Age and 3+ comorbidities: HTN, HLD, Obesity and prediabetes   are also affecting patient's functional outcome.   REHAB POTENTIAL: Good  CLINICAL DECISION MAKING: Stable/uncomplicated  EVALUATION COMPLEXITY: Low   GOALS: Goals reviewed with patient? No  SHORT TERM GOALS: Target date: 11/29/2023  Patient will demonstrate undestanding of home exercise plan by performing exercises correctly with evidence of good carry over with min to no verbal or tactile cues .   Baseline: NT  Goal status: INITIAL  2.  *** Baseline:  Goal status: INITIAL  3.  *** Baseline:  Goal status: INITIAL  4.  *** Baseline:  Goal  status: INITIAL  5.  *** Baseline:  Goal status: INITIAL  6.  *** Baseline:  Goal status: INITIAL  LONG TERM GOALS: Target date: 01/24/2024  Patient will improve left shoulder function as evidenced by an improvement in QuickDash score of >=10 pts to return to using LUE more reliably for self grooming and house maintenance tasks.  Baseline: 14%  Goal status: INITIAL  2.  *** Baseline:  Goal status: INITIAL  3.  *** Baseline:  Goal status: INITIAL  4.  *** Baseline:  Goal status: INITIAL  5.  *** Baseline:  Goal status: INITIAL  6.  *** Baseline:  Goal status: INITIAL  PLAN:  PT FREQUENCY: 1-2x/week  PT DURATION: 10 weeks  PLANNED INTERVENTIONS: {rehab planned  interventions:25118::"97110-Therapeutic exercises","97530- Therapeutic [phone]- Neuromuscular re-education","97535- Self FAOZ","30865- Manual therapy"}  PLAN FOR NEXT SESSION: ***  Ellin Goodie PT, DPT  [facility] [address] [place], [address] Phone: [phone]   Fax:  [phone]

## 2023-11-20 ENCOUNTER — Ambulatory Visit: Payer: Medicare Other | Admitting: Physical Therapy

## 2023-11-20 DIAGNOSIS — G8929 Other chronic pain: Secondary | ICD-10-CM

## 2023-11-20 DIAGNOSIS — M25511 Pain in right shoulder: Secondary | ICD-10-CM | POA: Diagnosis not present

## 2023-11-20 DIAGNOSIS — M542 Cervicalgia: Secondary | ICD-10-CM | POA: Diagnosis not present

## 2023-11-20 DIAGNOSIS — M25512 Pain in left shoulder: Secondary | ICD-10-CM | POA: Diagnosis not present

## 2023-11-20 NOTE — Therapy (Addendum)
 OUTPATIENT PHYSICAL THERAPY SHOULDER TREATMENT   Patient Name: Mary Wolfe MRN: [id number] DOB:[birth year], 73 y.o., female Today's Date: 11/20/2023  END OF SESSION:  PT End of Session - 11/20/23 1044     Visit Number 2    Number of Visits 15    Date for PT Re-Evaluation 01/03/24    Authorization Type Medicare 2025    Authorization - Visit Number 2    Authorization - Number of Visits 15    Progress Note Due on Visit 10    PT Start Time 1035    PT Stop Time 1115    PT Time Calculation (min) 40 min    Activity Tolerance Patient tolerated treatment well    Behavior During Therapy WFL for tasks assessed/performed              Past Medical History:  Diagnosis Date   Bursitis    Diverticulitis    Hyperlipidemia    Hypertension    Iron deficiency anemia    OA (osteoarthritis)    Obesity    PONV (postoperative nausea and vomiting)    Past Surgical History:  Procedure Laterality Date   CARPAL TUNNEL RELEASE Right    CESAREAN SECTION     COLONOSCOPY  03/14/2008   COLONOSCOPY WITH PROPOFOL N/A 06/25/2018   Procedure: COLONOSCOPY WITH PROPOFOL;  Surgeon: Christena Deem, MD;  Location: [facility] ENDOSCOPY;  Service: Endoscopy;  Laterality: N/A;   ESOPHAGOGASTRODUODENOSCOPY  04/01/1997   ESOPHAGOGASTRODUODENOSCOPY (EGD) WITH PROPOFOL N/A 06/25/2018   Procedure: ESOPHAGOGASTRODUODENOSCOPY (EGD) WITH PROPOFOL;  Surgeon: Christena Deem, MD;  Location: [facility] ENDOSCOPY;  Service: Endoscopy;  Laterality: N/A;   removed glass from elbow     from accident   TONSILLECTOMY     TOTAL HIP ARTHROPLASTY Right 04/16/2019   Procedure: Right Anterior Total Hip Arthroplasty;  Surgeon: Marcene Corning, MD;  Location: [facility] ORS;  Service: Orthopedics;  Laterality: Right;   Patient Active Problem List   Diagnosis Date Noted   URI with cough and congestion 10/06/2023   Trapezius strain 09/18/2023   Thumb injury 11/30/2022   Fall 11/30/2022   Prediabetes 06/26/2019   Greater trochanteric  bursitis of right hip 01/21/2019   Primary osteoarthritis of right hip 04/01/2016   Obesity 04/01/2016   Essential hypertension 01/31/2016   Hyperlipidemia 01/31/2016    PCP: Dr. Marikay Alar   REFERRING PROVIDER: Dr. Freddie Apley   REFERRING DIAG: [phone] (ICD-10-CM) - Left shoulder pain, unspecified chronicity   THERAPY DIAG:  Chronic left shoulder pain  Rationale for Evaluation and Treatment: Rehabilitation  ONSET DATE: 10/07/23   SUBJECTIVE:  SUBJECTIVE STATEMENT: Pt reports increased pain in both shoulder when doing the eccentric shoulder flexion exercises.  Hand dominance: Left  PERTINENT HISTORY: Pt states that she started to develop left shoulder pain after coming to physical therapy for right trap strain. She did experience a fall in 2021 where she fell on her left side. She did experience a lot of pain on her left side initially but the pain subsided until recently. She is able to sleep without pain or discomfort but continues to experience pain when reaching overhead or for objects out in front of her.   PAIN:  Are you having pain? Yes: NPRS scale: 2/10  Pain location: Anterior surface of left shoulder  Pain description: Achy  Aggravating factors: Reaching overhead.  Relieving factors: Shoulder Stretches   PRECAUTIONS: None  RED FLAGS: None   WEIGHT BEARING RESTRICTIONS: No  FALLS:  Has patient fallen in last 6 months? Yes. 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   NEXT MD VISIT: None listed   OBJECTIVE:  Note: Objective measures were completed at Evaluation unless otherwise noted.  VITALS: BP 134/73 HR 83 SpO2 98%  DIAGNOSTIC FINDINGS:  CLINICAL DATA:  Chronic shoulder pain after a fall 2 years ago.   EXAM: LEFT SHOULDER - 2+ VIEW   COMPARISON:  None.   FINDINGS: There is no evidence of fracture or dislocation. There are moderate acromioclavicular and glenohumeral degenerative changes. High-riding humeral head raising the possibility of rotator cuff pathology. Regional soft tissues are unremarkable.   IMPRESSION: 1. Moderate degenerative changes in the left shoulder. 2. High-riding humeral head raising the possibility of rotator cuff pathology.     Electronically Signed   By: Emmaline Kluver M.D.   On: 04/08/2020 14:17  PATIENT SURVEYS:  Quick Dash 14% symptom score and  0% Work Disability   COGNITION: Overall cognitive status: Within functional limits for tasks assessed     SENSATION: WFL  POSTURE: Forward head and rounded shoulders   UPPER EXTREMITY ROM:   Active ROM Right eval Left eval  Shoulder flexion 160 160   Shoulder extension    Shoulder abduction 160 160   Shoulder adduction    Shoulder internal rotation    Shoulder external rotation    Elbow flexion    Elbow extension    Wrist flexion    Wrist extension    Wrist ulnar deviation    Wrist radial deviation    Wrist pronation    Wrist supination    (Blank rows = not tested)  UPPER EXTREMITY MMT:  MMT Right eval Left eval  Shoulder flexion 4+ 4+  Shoulder extension    Shoulder abduction 4+ 4+  Shoulder adduction    Shoulder internal rotation 4+ 4+  Shoulder external rotation 4 4*  Middle trapezius 4 4  Lower trapezius 4- 4-  Elbow flexion 4+ 4+  Elbow extension    Wrist flexion    Wrist extension    Wrist ulnar deviation    Wrist radial deviation    Wrist pronation    Wrist supination     Grip strength (lbs)    (Blank rows =  not tested)  SHOULDER SPECIAL TESTS: Impingement tests: Neer impingement test: positive  and Painful arc test: positive  Rotator cuff assessment: Drop arm test: negative, Empty can test: negative, Full can test: negative, External rotation lag sign: negative, Belly press test: negative, and Infraspinatus test: negative Biceps assessment: Speed's test: positive    PALPATION:  Not performed                                                                                                                              TREATMENT DATE:   11/20/23: THEREX   UBE seat at 12 2.5 MIN FORWARD AND 2.5 MIN Backward   Eccentric Shoulder Flexion #2 DB to 180 degrees 1 x 10  -Pt reports increased pain when reaching 90 degrees of flexion from above 120 degrees .  Eccentric Shoulder Flexion #2 DB from 90 degrees 1 x 10  -Pt continues to experience increased pain at 30 degrees  Eccentric Shoulder Flexion #1 DB from 90 degrees 1 x 10  -Pt continues to experience increased pain at 30 degrees  Lower Trap Setting Wall Set  2 x 5  -min VC to maintain flexion of arms to avoid pain on LUE  -Pt reports increased pain in her left shoulder  Bent Over Y's 2 x 10 # 1 DB   Shoulder ER/IR at 0 deg abduction with Blue band 1 x 10  Shoulder ER/IR at 0 deg abduction with blue band anchored on door 2 x 10   11/15/23:  THEREX  Eccentric left shoulder flexion with red band 1 x 10  -Pt reports increased right shoulder pain at 30 deg flexion  -Mod VC to decrease speed of exercise  Eccentric left shoulder flexion with yellow band 1 x 10  Doorway pec stretch 2 x 30 sec    PATIENT EDUCATION: Education details: Form and technique for correct performance of exercise and explanation about underlying pathology of left shoulder  Person educated: Patient Education method: Programmer, multimedia, Demonstration, Verbal cues, and Handouts Education comprehension: verbalized understanding, returned  demonstration, and verbal cues required  HOME EXERCISE PROGRAM: Access Code: [email or web address] URL: [email or web address] Date: 11/20/2023 Prepared by: Ellin Goodie  Exercises - Seated Shoulder Abduction AAROM with Pulley at Side  - 1 x daily - 2 sets - 10 reps - Corner Pec Major Stretch  - 1 x daily - 3 reps - 30-60 sec  hold - Standing Eccentric Shoulder Flexion at Wall  - 3-4 x weekly - 3 sets - 10 reps - Shoulder External Rotation with Anchored Resistance  - 3-4 x weekly - 3 sets - 10 reps - Shoulder Bent over Y's   - 3-4 x weekly - 3 sets - 10 reps  ASSESSMENT:  CLINICAL IMPRESSION: Pt shows limitations with performing left shoulder flexion due shoulder impingement. Exercises modified to include decreased load on the bicep's tendon and bent over position with Y's. Pt able to perform all exercises without an increase in her pain.  She will continue to benefit from skilled PT to address these aforementioned deficits to return to using dominant hand for lifting and overhead activities without excessive pain and discomfort.   OBJECTIVE IMPAIRMENTS: decreased ROM and pain.   ACTIVITY LIMITATIONS: carrying, lifting, and reach over head  PARTICIPATION LIMITATIONS: cleaning, laundry, shopping, and community activity  PERSONAL FACTORS: Age and 3+ comorbidities: HTN, HLD, Obesity and prediabetes   are also affecting patient's functional outcome.   REHAB POTENTIAL: Good  CLINICAL DECISION MAKING: Stable/uncomplicated  EVALUATION COMPLEXITY: Low   GOALS: Goals reviewed with patient? No  SHORT TERM GOALS: Target date: 11/29/2023  Patient will demonstrate undestanding of home exercise plan by performing exercises correctly with evidence of good carry over with min to no verbal or tactile cues .   Baseline: NT 11/20/23: Performing independently  Goal status: ACHIEVED   LONG TERM GOALS: Target date: 01/24/2024  Patient will improve left shoulder function as evidenced by an  improvement in QuickDash score of >=10 pts to return to using LUE more reliably for self grooming and house maintenance tasks.  Baseline: 14% disability  Goal status: ONGOING   2.  Patient will improve left shoulder and periscapular strength by 1/3 grade MMT (4- to 4) for improved left shoulder function and to decrease left shoulder pain to complete typing and lifting tasks for work and home care tasks.  Baseline: Shoulder ER L 4, Middle Trap R/L 4/4, Lower R/L 4-/4- Goal status: ONGOING   3.  Patient will be able to perform combined left shoulder ER (touching occiput) and shoulder IR (L4-L5) without an increase in left shoulder pain >5/10 NRPS for improved grooming tasks and dressing herself without pain or discomfort.   Baseline: NT  Goal status: ONGOING    PLAN:  PT FREQUENCY: 1-2x/week  PT DURATION: 10 weeks  PLANNED INTERVENTIONS: 97164- PT Re-evaluation, 97110-Therapeutic exercises, 97530- Therapeutic activity, 97112- Neuromuscular re-education, 97535- Self Care, 78295- Manual therapy, [phone]- Gait training, [phone]- Orthotic Fit/training, [phone]- Aquatic Therapy, [phone]- Electrical stimulation (unattended), [phone]- Electrical stimulation (manual), Patient/Family education, Balance training, Taping, Dry Needling, Joint mobilization, Joint manipulation, Spinal manipulation, Spinal mobilization, Cryotherapy, and Moist heat  PLAN FOR NEXT SESSION:  Continue to progress shoulder eccentric and periscapular exercises. Test pain levels with grooming tasks.   Ellin Goodie PT, DPT  [facility] [address] [place], [address] Phone: [phone]   Fax:  [phone]

## 2023-11-22 ENCOUNTER — Ambulatory Visit: Payer: Medicare Other | Admitting: Physical Therapy

## 2023-11-22 DIAGNOSIS — M542 Cervicalgia: Secondary | ICD-10-CM | POA: Diagnosis not present

## 2023-11-22 DIAGNOSIS — M25512 Pain in left shoulder: Secondary | ICD-10-CM | POA: Diagnosis not present

## 2023-11-22 DIAGNOSIS — G8929 Other chronic pain: Secondary | ICD-10-CM

## 2023-11-22 DIAGNOSIS — M25511 Pain in right shoulder: Secondary | ICD-10-CM | POA: Diagnosis not present

## 2023-11-22 NOTE — Therapy (Signed)
 OUTPATIENT PHYSICAL THERAPY SHOULDER TREATMENT   Patient Name: Mary Wolfe MRN: [id number] DOB:[birth year], 73 y.o., female Today's Date: 11/22/2023  END OF SESSION:  PT End of Session - 11/22/23 1037     Visit Number 3    Number of Visits 15    Date for PT Re-Evaluation 01/03/24    Authorization Type Medicare 2025    Authorization - Visit Number 3    Authorization - Number of Visits 15    Progress Note Due on Visit 10    PT Start Time 1035    PT Stop Time 1115    PT Time Calculation (min) 40 min    Activity Tolerance Patient tolerated treatment well    Behavior During Therapy WFL for tasks assessed/performed               Past Medical History:  Diagnosis Date   Bursitis    Diverticulitis    Hyperlipidemia    Hypertension    Iron deficiency anemia    OA (osteoarthritis)    Obesity    PONV (postoperative nausea and vomiting)    Past Surgical History:  Procedure Laterality Date   CARPAL TUNNEL RELEASE Right    CESAREAN SECTION     COLONOSCOPY  03/14/2008   COLONOSCOPY WITH PROPOFOL N/A 06/25/2018   Procedure: COLONOSCOPY WITH PROPOFOL;  Surgeon: Christena Deem, MD;  Location: [facility] ENDOSCOPY;  Service: Endoscopy;  Laterality: N/A;   ESOPHAGOGASTRODUODENOSCOPY  04/01/1997   ESOPHAGOGASTRODUODENOSCOPY (EGD) WITH PROPOFOL N/A 06/25/2018   Procedure: ESOPHAGOGASTRODUODENOSCOPY (EGD) WITH PROPOFOL;  Surgeon: Christena Deem, MD;  Location: [facility] ENDOSCOPY;  Service: Endoscopy;  Laterality: N/A;   removed glass from elbow     from accident   TONSILLECTOMY     TOTAL HIP ARTHROPLASTY Right 04/16/2019   Procedure: Right Anterior Total Hip Arthroplasty;  Surgeon: Marcene Corning, MD;  Location: [facility];  Service: Orthopedics;  Laterality: Right;   Patient Active Problem List   Diagnosis Date Noted   URI with cough and congestion 10/06/2023   Trapezius strain 09/18/2023   Thumb injury 11/30/2022   Fall 11/30/2022   Prediabetes 06/26/2019   Greater trochanteric  bursitis of right hip 01/21/2019   Primary osteoarthritis of right hip 04/01/2016   Obesity 04/01/2016   Essential hypertension 01/31/2016   Hyperlipidemia 01/31/2016    PCP: Dr. Marikay Alar   REFERRING PROVIDER: Dr. Freddie Apley   REFERRING DIAG: [phone] (ICD-10-CM) - Left shoulder pain, unspecified chronicity   THERAPY DIAG:  Chronic left shoulder pain  Rationale for Evaluation and Treatment: Rehabilitation  ONSET DATE: 10/07/23   SUBJECTIVE:  SUBJECTIVE STATEMENT:  Pt reports that she has been doing well with her shoulder and exercises have been going well too. She is wondering whether K-tape would work because she has used in the past and it has helped.   Hand dominance: Left  PERTINENT HISTORY: Pt states that she started to develop left shoulder pain after coming to physical therapy for right trap strain. She did experience a fall in 2021 where she fell on her left side. She did experience a lot of pain on her left side initially but the pain subsided until recently. She is able to sleep without pain or discomfort but continues to experience pain when reaching overhead or for objects out in front of her.   PAIN:  Are you having pain? Yes: NPRS scale: 2/10  Pain location: Anterior surface of left shoulder  Pain description: Achy  Aggravating factors: Reaching overhead.  Relieving factors: Shoulder Stretches   PRECAUTIONS: None  RED FLAGS: None   WEIGHT BEARING RESTRICTIONS: No  FALLS:  Has patient fallen in last 6 months? Yes. 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   NEXT MD VISIT: None listed   OBJECTIVE:  Note: Objective measures were completed at Evaluation unless otherwise noted.  VITALS: BP 134/73 HR 83 SpO2 98%  DIAGNOSTIC FINDINGS:  CLINICAL DATA:  Chronic shoulder pain after a fall 2 years ago.   EXAM: LEFT SHOULDER - 2+ VIEW   COMPARISON:  None.   FINDINGS: There is no evidence of fracture or dislocation. There are moderate acromioclavicular and glenohumeral degenerative changes. High-riding humeral head raising the possibility of rotator cuff pathology. Regional soft tissues are unremarkable.   IMPRESSION: 1. Moderate degenerative changes in the left shoulder. 2. High-riding humeral head raising the possibility of rotator cuff pathology.     Electronically Signed   By: Emmaline Kluver M.D.   On: 04/08/2020 14:17  PATIENT SURVEYS:  Quick Dash 14% symptom score and  0% Work Disability   COGNITION: Overall cognitive status: Within functional limits for tasks assessed     SENSATION: WFL  POSTURE: Forward head and rounded shoulders   UPPER EXTREMITY ROM:   Active ROM Right eval Left eval  Shoulder flexion 160 160   Shoulder extension    Shoulder abduction 160 160   Shoulder adduction    Shoulder internal rotation    Shoulder external rotation    Elbow flexion    Elbow extension    Wrist flexion    Wrist extension    Wrist ulnar deviation    Wrist radial deviation    Wrist pronation    Wrist supination    (Blank rows = not tested)  UPPER EXTREMITY MMT:  MMT Right eval Left eval  Shoulder flexion 4+ 4+  Shoulder extension    Shoulder abduction 4+ 4+  Shoulder adduction    Shoulder internal rotation 4+ 4+  Shoulder external rotation 4 4*  Middle trapezius 4 4  Lower trapezius 4- 4-  Elbow flexion 4+ 4+  Elbow extension    Wrist flexion    Wrist  extension    Wrist ulnar deviation    Wrist  radial deviation    Wrist pronation    Wrist supination    Grip strength (lbs)    (Blank rows = not tested)  SHOULDER SPECIAL TESTS: Impingement tests: Neer impingement test: positive  and Painful arc test: positive  Rotator cuff assessment: Drop arm test: negative, Empty can test: negative, Full can test: negative, External rotation lag sign: negative, Belly press test: negative, and Infraspinatus test: negative Biceps assessment: Speed's test: positive    PALPATION:  Not performed                                                                                                                              TREATMENT DATE:   11/22/23:  THEREX  UBE seat at 12 2.5 MIN FORWARD AND 2.5 MIN Backward   OMEGA seated rows #30 2 x 10  -min VC to decrease retraction to thoracic side wall  Standing Rows with black band 1 x 10  Standing Rows with gray band 1 x 10  Push Up with Plus 1 x 10  Supine Serratus Punches #5 DB 1 x 10   NMR  Eccentric Shoulder Flexion on LLE #2 DB 1 x 10  -Pt reports increased left shoulder pain especially at 20 degrees  Eccentric Shoulder Flexion on LLE #2 DB 2 x 10 between 90 and 160 degrees  Left Shoulder Flexion with #2 DB 2 x 10 with emphasis on maintaining posture  -visual cue provided with mirror to maintain shoulders down and back and to avoid excessive upper trap activaiton  11/20/23: THEREX   UBE seat at 12 2.5 MIN FORWARD AND 2.5 MIN Backward   Eccentric Shoulder Flexion #2 DB to 180 degrees 1 x 10  -Pt reports increased pain when reaching 90 degrees of flexion from above 120 degrees .  Eccentric Shoulder Flexion #2 DB from 90 degrees 1 x 10  -Pt continues to experience increased pain at 30 degrees  Eccentric Shoulder Flexion #1 DB from 90 degrees 1 x 10  -Pt continues to experience increased pain at 30 degrees  Lower Trap Setting Wall Set  2 x 5  -min VC to maintain flexion of arms to avoid pain on LUE   -Pt reports increased pain in her left shoulder  Bent Over Y's 2 x 10 # 1 DB   Shoulder ER/IR at 0 deg abduction with Blue band 1 x 10  Shoulder ER/IR at 0 deg abduction with blue band anchored on door 2 x 10   11/15/23:  THEREX  Eccentric left shoulder flexion with red band 1 x 10  -Pt reports increased right shoulder pain at 30 deg flexion  -Mod VC to decrease speed of exercise  Eccentric left shoulder flexion with yellow band 1 x 10  Doorway pec stretch 2 x 30 sec    PATIENT EDUCATION: Education details: Form and technique for correct performance of exercise and explanation about underlying pathology of left shoulder  Person educated: Patient Education  method: Explanation, Demonstration, Verbal cues, and Handouts Education comprehension: verbalized understanding, returned demonstration, and verbal cues required  HOME EXERCISE PROGRAM: Access Code: [email or web address] URL: [email or web address] Date: 11/22/2023 Prepared by: Ellin Goodie  Exercises - Seated Shoulder Abduction AAROM with Pulley at Side  - 1 x daily - 2 sets - 10 reps - Corner Pec Major Stretch  - 1 x daily - 3 reps - 30-60 sec  hold - Shoulder External Rotation with Anchored Resistance  - 3-4 x weekly - 3 sets - 10 reps - Shoulder Bent over Y's   - 3-4 x weekly - 3 sets - 10 reps - Standing Bilateral Low Shoulder Row with Anchored Resistance  - 3-4 x weekly - 3 sets - 10 reps - Standing Shoulder Flexion to 180 degrees with dumbbells   - 3-4 x weekly - 3 sets - 10 reps  ASSESSMENT:  CLINICAL IMPRESSION: Pt demonstrates a significant improvement in neuromuscular facilitation of left rotator cuff and periscapular recruitment when performing shoulder flexion to avoid compensation and resulting left shoulder pain. With min VC and visual cueing, she was able to maintain correct posture while performing exercises. She will continue to benefit from skilled PT to address these aforementioned deficits to return to  using dominant hand for lifting and overhead activities without excessive pain and discomfort.   OBJECTIVE IMPAIRMENTS: decreased ROM and pain.   ACTIVITY LIMITATIONS: carrying, lifting, and reach over head  PARTICIPATION LIMITATIONS: cleaning, laundry, shopping, and community activity  PERSONAL FACTORS: Age and 3+ comorbidities: HTN, HLD, Obesity and prediabetes   are also affecting patient's functional outcome.   REHAB POTENTIAL: Good  CLINICAL DECISION MAKING: Stable/uncomplicated  EVALUATION COMPLEXITY: Low   GOALS: Goals reviewed with patient? No  SHORT TERM GOALS: Target date: 11/29/2023  Patient will demonstrate undestanding of home exercise plan by performing exercises correctly with evidence of good carry over with min to no verbal or tactile cues .   Baseline: NT 11/20/23: Performing independently  Goal status: ACHIEVED   LONG TERM GOALS: Target date: 01/24/2024  Patient will improve left shoulder function as evidenced by an improvement in QuickDash score of >=10 pts to return to using LUE more reliably for self grooming and house maintenance tasks.  Baseline: 14% disability  Goal status: ONGOING   2.  Patient will improve left shoulder and periscapular strength by 1/3 grade MMT (4- to 4) for improved left shoulder function and to decrease left shoulder pain to complete typing and lifting tasks for work and home care tasks.  Baseline: Shoulder ER L 4, Middle Trap R/L 4/4, Lower R/L 4-/4- Goal status: ONGOING   3.  Patient will be able to perform combined left shoulder ER (touching occiput) and shoulder IR (L4-L5) without an increase in left shoulder pain >5/10 NRPS for improved grooming tasks and dressing herself without pain or discomfort.   Baseline: NT  Goal status: ONGOING    PLAN:  PT FREQUENCY: 1-2x/week  PT DURATION: 10 weeks  PLANNED INTERVENTIONS: 97164- PT Re-evaluation, 97110-Therapeutic exercises, 97530- Therapeutic activity, 97112- Neuromuscular  re-education, [phone]- Self Care, 08657- Manual therapy, [phone]- Gait training, [phone]- Orthotic Fit/training, [phone]- Aquatic Therapy, [phone]- Electrical stimulation (unattended), [phone]- Electrical stimulation (manual), Patient/Family education, Balance training, Taping, Dry Needling, Joint mobilization, Joint manipulation, Spinal manipulation, Spinal mobilization, Cryotherapy, and Moist heat  PLAN FOR NEXT SESSION:  Review functional tasks like reaching to place objects on shelf and reaching for objects overhead   Ellin Goodie PT, DPT  [facility]  [address] [place], [address] Phone: [phone]   Fax:  [phone]

## 2023-11-23 ENCOUNTER — Telehealth: Payer: Self-pay | Admitting: Family Medicine

## 2023-11-23 NOTE — Telephone Encounter (Signed)
 Dr Clent Ridges is leaving the practice and your New Patient or Transfer of Care appointment needs to be rescheduled with another provider. Please call the office to schedule a Transfer of Care to either Dr Charlann Lange, Darleen Crocker or Kara Dies, NP.   Thank you  E2C2, please reschedule this patient's new pt visit. [facility]

## 2023-11-27 ENCOUNTER — Ambulatory Visit: Payer: Medicare Other | Admitting: Physical Therapy

## 2023-11-27 DIAGNOSIS — M542 Cervicalgia: Secondary | ICD-10-CM | POA: Diagnosis not present

## 2023-11-27 DIAGNOSIS — G8929 Other chronic pain: Secondary | ICD-10-CM

## 2023-11-27 DIAGNOSIS — M25511 Pain in right shoulder: Secondary | ICD-10-CM | POA: Diagnosis not present

## 2023-11-27 DIAGNOSIS — M25512 Pain in left shoulder: Secondary | ICD-10-CM | POA: Diagnosis not present

## 2023-11-27 NOTE — Therapy (Addendum)
 OUTPATIENT PHYSICAL THERAPY SHOULDER TREATMENT   Patient Name: Mary Wolfe MRN: [id number] DOB:06-03-1951, 73 y.o., female Today's Date: 11/27/2023  END OF SESSION:  PT End of Session - 11/27/23 0946     Visit Number 4    Number of Visits 15    Date for PT Re-Evaluation 01/03/24    Authorization Type Medicare 2025    Authorization - Visit Number 4    Authorization - Number of Visits 15    Progress Note Due on Visit 10    PT Start Time 0945    PT Stop Time 1030    PT Time Calculation (min) 45 min    Activity Tolerance Patient tolerated treatment well    Behavior During Therapy WFL for tasks assessed/performed               Past Medical History:  Diagnosis Date   Bursitis    Diverticulitis    Hyperlipidemia    Hypertension    Iron deficiency anemia    OA (osteoarthritis)    Obesity    PONV (postoperative nausea and vomiting)    Past Surgical History:  Procedure Laterality Date   CARPAL TUNNEL RELEASE Right    CESAREAN SECTION     COLONOSCOPY  03/14/2008   COLONOSCOPY WITH PROPOFOL N/A 06/25/2018   Procedure: COLONOSCOPY WITH PROPOFOL;  Surgeon: Christena Deem, MD;  Location: [facility] ENDOSCOPY;  Service: Endoscopy;  Laterality: N/A;   ESOPHAGOGASTRODUODENOSCOPY  04/01/1997   ESOPHAGOGASTRODUODENOSCOPY (EGD) WITH PROPOFOL N/A 06/25/2018   Procedure: ESOPHAGOGASTRODUODENOSCOPY (EGD) WITH PROPOFOL;  Surgeon: Christena Deem, MD;  Location: [facility] ENDOSCOPY;  Service: Endoscopy;  Laterality: N/A;   removed glass from elbow     from accident   TONSILLECTOMY     TOTAL HIP ARTHROPLASTY Right 04/16/2019   Procedure: Right Anterior Total Hip Arthroplasty;  Surgeon: Marcene Corning, MD;  Location: [facility] ORS;  Service: Orthopedics;  Laterality: Right;   Patient Active Problem List   Diagnosis Date Noted   URI with cough and congestion 10/06/2023   Trapezius strain 09/18/2023   Thumb injury 11/30/2022   Fall 11/30/2022   Prediabetes 06/26/2019   Greater trochanteric  bursitis of right hip 01/21/2019   Primary osteoarthritis of right hip 04/01/2016   Obesity 04/01/2016   Essential hypertension 01/31/2016   Hyperlipidemia 01/31/2016    PCP: Dr. Marikay Alar   REFERRING PROVIDER: Dr. Freddie Apley   REFERRING DIAG: [phone] (ICD-10-CM) - Left shoulder pain, unspecified chronicity   THERAPY DIAG:  Chronic left shoulder pain  Rationale for Evaluation and Treatment: Rehabilitation  ONSET DATE: 10/07/23   SUBJECTIVE:  SUBJECTIVE STATEMENT:  Pt states that she has continued to have decreased pain in left shoulder with the exception of left shoulder eccentric.   Hand dominance: Left  PERTINENT HISTORY: Pt states that she started to develop left shoulder pain after coming to physical therapy for right trap strain. She did experience a fall in 2021 where she fell on her left side. She did experience a lot of pain on her left side initially but the pain subsided until recently. She is able to sleep without pain or discomfort but continues to experience pain when reaching overhead or for objects out in front of her.   PAIN:  Are you having pain? Yes: NPRS scale: 2/10  Pain location: Anterior surface of left shoulder  Pain description: Achy  Aggravating factors: Reaching overhead.  Relieving factors: Shoulder Stretches   PRECAUTIONS: None  RED FLAGS: None   WEIGHT BEARING RESTRICTIONS: No  FALLS:  Has patient fallen in last 6 months? Yes. 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   NEXT MD VISIT: None listed   OBJECTIVE:  Note: Objective measures were completed at Evaluation unless otherwise noted.  VITALS: BP 134/73 HR 83 SpO2 98%  DIAGNOSTIC FINDINGS:  CLINICAL DATA:  Chronic shoulder pain after a fall 2 years ago.   EXAM: LEFT SHOULDER - 2+ VIEW   COMPARISON:  None.   FINDINGS: There is no evidence of fracture or dislocation. There are moderate acromioclavicular and glenohumeral degenerative changes. High-riding humeral head raising the possibility of rotator cuff pathology. Regional soft tissues are unremarkable.   IMPRESSION: 1. Moderate degenerative changes in the left shoulder. 2. High-riding humeral head raising the possibility of rotator cuff pathology.     Electronically Signed   By: Emmaline Kluver M.D.   On: 04/08/2020 14:17  PATIENT SURVEYS:  Quick Dash 14% symptom score and  0% Work Disability   COGNITION: Overall cognitive status: Within functional limits for tasks assessed     SENSATION: WFL  POSTURE: Forward head and rounded shoulders   UPPER EXTREMITY ROM:   Active ROM Right eval Left eval  Shoulder flexion 160 160   Shoulder extension    Shoulder abduction 160 160   Shoulder adduction    Shoulder internal rotation    Shoulder external rotation    Elbow flexion    Elbow extension    Wrist flexion    Wrist extension    Wrist ulnar deviation    Wrist radial deviation    Wrist pronation    Wrist supination    (Blank rows = not tested)  UPPER EXTREMITY MMT:  MMT Right eval Left eval  Shoulder flexion 4+ 4+  Shoulder extension    Shoulder abduction 4+ 4+  Shoulder adduction    Shoulder internal rotation 4+ 4+  Shoulder external rotation 4 4*  Middle trapezius 4 4  Lower trapezius 4- 4-  Elbow flexion 4+ 4+  Elbow extension    Wrist flexion    Wrist extension    Wrist ulnar deviation    Wrist radial deviation     Wrist pronation    Wrist supination    Grip  strength (lbs)    (Blank rows = not tested)  SHOULDER SPECIAL TESTS: Impingement tests: Neer impingement test: positive  and Painful arc test: positive  Rotator cuff assessment: Drop arm test: negative, Empty can test: negative, Full can test: negative, External rotation lag sign: negative, Belly press test: negative, and Infraspinatus test: negative Biceps assessment: Speed's test: positive    PALPATION:  Not performed                                                                                                                              TREATMENT DATE:   11/27/23: THEREX   UBE with seat at 12 at 3 resistance- 2.5 min forward and 2.5 min backward   Eccentric Left Shoulder Flexion 1 x 10  -Pt reports NRPS 3/10 at 70 deg  Supine Left Shoulder Flex with straight elbow 1 x 10  -pain at 60 deg  Supine Left Shoulder Flex with bent elbow   -pain at 60 deg   Seated Left Shoulder Flex with straight elbow 1 x 10  -mod VC to increased scapular retraction.  Seated Left Shoulder Flex with #1 DB 2 x 10 -VC with use of mirror to increase scapular retraction  Standing Rows with #35 1 x 10   Bent over Scapular Retraction with blue band on LUE 1 x 10   Bent over Scapular Retraction with black band on LUE 1 x 10  OMEGA Seated Rows #35 1 x 10  OMEGA Seated Rows #45 1 x 10 OMEGA Seated Rows #50 1 x 10  Standing Horizontal Abduction with red  band 1 x 10  Standing Horizontal Abduction with green band 1 x 10    11/22/23:  THEREX  UBE seat at 12 2.5 MIN FORWARD AND 2.5 MIN Backward   OMEGA seated rows #30 2 x 10  -min VC to decrease retraction to thoracic side wall  Standing Rows with black band 1 x 10  Standing Rows with gray band 1 x 10  Push Up with Plus 1 x 10  Supine Serratus Punches #5 DB 1 x 10   NMR  Eccentric Shoulder Flexion on LLE #2 DB 1 x 10  -Pt reports increased left shoulder pain especially at 20 degrees  Eccentric  Shoulder Flexion on LLE #2 DB 2 x 10 between 90 and 160 degrees  Left Shoulder Flexion with #2 DB 2 x 10 with emphasis on maintaining posture  -visual cue provided with mirror to maintain shoulders down and back and to avoid excessive upper trap activaiton  11/20/23: THEREX   UBE seat at 12 2.5 MIN FORWARD AND 2.5 MIN Backward   Eccentric Shoulder Flexion #2 DB to 180 degrees 1 x 10  -Pt reports increased pain when reaching 90 degrees of flexion from above 120 degrees .  Eccentric Shoulder Flexion #2 DB from 90 degrees 1 x 10  -Pt continues to experience increased pain at 30 degrees  Eccentric Shoulder Flexion #  1 DB from 90 degrees 1 x 10  -Pt continues to experience increased pain at 30 degrees  Lower Trap Setting Wall Set  2 x 5  -min VC to maintain flexion of arms to avoid pain on LUE  -Pt reports increased pain in her left shoulder  Bent Over Y's 2 x 10 # 1 DB   Shoulder ER/IR at 0 deg abduction with Blue band 1 x 10  Shoulder ER/IR at 0 deg abduction with blue band anchored on door 2 x 10   11/15/23:  THEREX  Eccentric left shoulder flexion with red band 1 x 10  -Pt reports increased right shoulder pain at 30 deg flexion  -Mod VC to decrease speed of exercise  Eccentric left shoulder flexion with yellow band 1 x 10  Doorway pec stretch 2 x 30 sec    PATIENT EDUCATION: Education details: Form and technique for correct performance of exercise and explanation about underlying pathology of left shoulder  Person educated: Patient Education method: Programmer, multimedia, Demonstration, Verbal cues, and Handouts Education comprehension: verbalized understanding, returned demonstration, and verbal cues required  HOME EXERCISE PROGRAM: Access Code: [email or web address] URL: [email or web address] Date: 11/27/2023 Prepared by: Ellin Goodie  Exercises - Seated Shoulder Abduction AAROM with Pulley at Side  - 1 x daily - 2 sets - 10 reps - Corner Pec Major Stretch  - 1 x daily - 3 reps -  30-60 sec  hold - Shoulder External Rotation with Anchored Resistance  - 3-4 x weekly - 3 sets - 10 reps - Shoulder Bent over Y's   - 3-4 x weekly - 3 sets - 10 reps - Standing Shoulder Flexion to 180 degrees with dumbbells   - 3-4 x weekly - 3 sets - 10 reps - Standing Shoulder Horizontal Abduction with Resistance  - 3-4 x weekly - 3 sets - 10 reps   ASSESSMENT:  CLINICAL IMPRESSION: Pt continues to be limited by left shoulder pain, but does show significant improvement with scapular retraction indicating ongoing impingement. She demonstrates improved periscapular strength as evidenced by her ability to perform strengthening exercises with increased resistance. She will continue to benefit from skilled PT to address these aforementioned deficits to return to using dominant hand for lifting and overhead activities without excessive pain and discomfort.   OBJECTIVE IMPAIRMENTS: decreased ROM and pain.   ACTIVITY LIMITATIONS: carrying, lifting, and reach over head  PARTICIPATION LIMITATIONS: cleaning, laundry, shopping, and community activity  PERSONAL FACTORS: Age and 3+ comorbidities: HTN, HLD, Obesity and prediabetes   are also affecting patient's functional outcome.   REHAB POTENTIAL: Good  CLINICAL DECISION MAKING: Stable/uncomplicated  EVALUATION COMPLEXITY: Low   GOALS: Goals reviewed with patient? No  SHORT TERM GOALS: Target date: 11/29/2023  Patient will demonstrate undestanding of home exercise plan by performing exercises correctly with evidence of good carry over with min to no verbal or tactile cues .   Baseline: NT 11/20/23: Performing independently  Goal status: ACHIEVED   LONG TERM GOALS: Target date: 01/24/2024  Patient will improve left shoulder function as evidenced by an improvement in QuickDash score of >=10 pts to return to using LUE more reliably for self grooming and house maintenance tasks.  Baseline: 14% disability  Goal status: ONGOING   2.  Patient  will improve left shoulder and periscapular strength by 1/3 grade MMT (4- to 4) for improved left shoulder function and to decrease left shoulder pain to complete typing and lifting tasks for work and home care tasks.  Baseline: Shoulder  ER L 4, Middle Trap R/L 4/4, Lower R/L 4-/4- Goal status: ONGOING   3.  Patient will be able to perform combined left shoulder ER (touching occiput) and shoulder IR (L4-L5) without an increase in left shoulder pain >5/10 NRPS for improved grooming tasks and dressing herself without pain or discomfort.   Baseline: NT  Goal status: ONGOING    PLAN:  PT FREQUENCY: 1-2x/week  PT DURATION: 10 weeks  PLANNED INTERVENTIONS: 97164- PT Re-evaluation, 97110-Therapeutic exercises, 97530- Therapeutic activity, 97112- Neuromuscular re-education, 97535- Self Care, 13086- Manual therapy, [phone]- Gait training, [phone]- Orthotic Fit/training, [phone]- Aquatic Therapy, [phone]- Electrical stimulation (unattended), [phone]- Electrical stimulation (manual), Patient/Family education, Balance training, Taping, Dry Needling, Joint mobilization, Joint manipulation, Spinal manipulation, Spinal mobilization, Cryotherapy, and Moist heat  PLAN FOR NEXT SESSION:  Reassess goals. Continue with periscapular strengthening: bent over rows, prone lower trap D2 flexion/extension, and attempt face pulls.   Ellin Goodie PT, DPT  [facility] [address] [place], [address] Phone: [phone]   Fax:  [phone]

## 2023-11-29 ENCOUNTER — Ambulatory Visit: Payer: Medicare Other | Admitting: Physical Therapy

## 2023-11-29 DIAGNOSIS — G8929 Other chronic pain: Secondary | ICD-10-CM | POA: Diagnosis not present

## 2023-11-29 DIAGNOSIS — M25511 Pain in right shoulder: Secondary | ICD-10-CM | POA: Diagnosis not present

## 2023-11-29 DIAGNOSIS — M25512 Pain in left shoulder: Secondary | ICD-10-CM | POA: Diagnosis not present

## 2023-11-29 DIAGNOSIS — M542 Cervicalgia: Secondary | ICD-10-CM | POA: Diagnosis not present

## 2023-11-29 NOTE — Therapy (Signed)
 OUTPATIENT PHYSICAL THERAPY SHOULDER TREATMENT   Patient Name: Mary Wolfe MRN: [id number] DOB:[birth year], 73 y.o., female Today's Date: 11/29/2023  END OF SESSION:  PT End of Session - 11/29/23 0957     Visit Number 5    Number of Visits 15    Date for PT Re-Evaluation 01/03/24    Authorization Type Medicare 2025    Authorization - Visit Number 5    Authorization - Number of Visits 15    Progress Note Due on Visit 10    PT Start Time 0950    PT Stop Time 1030    PT Time Calculation (min) 40 min    Activity Tolerance Patient tolerated treatment well    Behavior During Therapy WFL for tasks assessed/performed               Past Medical History:  Diagnosis Date   Bursitis    Diverticulitis    Hyperlipidemia    Hypertension    Iron deficiency anemia    OA (osteoarthritis)    Obesity    PONV (postoperative nausea and vomiting)    Past Surgical History:  Procedure Laterality Date   CARPAL TUNNEL RELEASE Right    CESAREAN SECTION     COLONOSCOPY  03/14/2008   COLONOSCOPY WITH PROPOFOL N/A 06/25/2018   Procedure: COLONOSCOPY WITH PROPOFOL;  Surgeon: Christena Deem, MD;  Location: [facility] ENDOSCOPY;  Service: Endoscopy;  Laterality: N/A;   ESOPHAGOGASTRODUODENOSCOPY  04/01/1997   ESOPHAGOGASTRODUODENOSCOPY (EGD) WITH PROPOFOL N/A 06/25/2018   Procedure: ESOPHAGOGASTRODUODENOSCOPY (EGD) WITH PROPOFOL;  Surgeon: Christena Deem, MD;  Location: [facility] ENDOSCOPY;  Service: Endoscopy;  Laterality: N/A;   removed glass from elbow     from accident   TONSILLECTOMY     TOTAL HIP ARTHROPLASTY Right 04/16/2019   Procedure: Right Anterior Total Hip Arthroplasty;  Surgeon: Marcene Corning, MD;  Location: [facility] ORS;  Service: Orthopedics;  Laterality: Right;   Patient Active Problem List   Diagnosis Date Noted   URI with cough and congestion 10/06/2023   Trapezius strain 09/18/2023   Thumb injury 11/30/2022   Fall 11/30/2022   Prediabetes 06/26/2019   Greater trochanteric  bursitis of right hip 01/21/2019   Primary osteoarthritis of right hip 04/01/2016   Obesity 04/01/2016   Essential hypertension 01/31/2016   Hyperlipidemia 01/31/2016    PCP: Dr. Marikay Alar   REFERRING PROVIDER: Dr. Freddie Apley   REFERRING DIAG: [phone] (ICD-10-CM) - Left shoulder pain, unspecified chronicity   THERAPY DIAG:  Chronic left shoulder pain  Rationale for Evaluation and Treatment: Rehabilitation  ONSET DATE: 10/07/23   SUBJECTIVE:  SUBJECTIVE STATEMENT:  She still experiencing a slight increase in her shoulder pain when performing the left shoulder eccentric flexion exercises, but otherwise her left shoulder has been feeling much improved.  Hand dominance: Left  PERTINENT HISTORY: Pt states that she started to develop left shoulder pain after coming to physical therapy for right trap strain. She did experience a fall in 2021 where she fell on her left side. She did experience a lot of pain on her left side initially but the pain subsided until recently. She is able to sleep without pain or discomfort but continues to experience pain when reaching overhead or for objects out in front of her.   PAIN:  Are you having pain? Yes: NPRS scale: 2/10  Pain location: Anterior surface of left shoulder  Pain description: Achy  Aggravating factors: Reaching overhead.  Relieving factors: Shoulder Stretches   PRECAUTIONS: None  RED FLAGS: None   WEIGHT BEARING RESTRICTIONS: No  FALLS:  Has patient fallen in last 6 months? Yes. Number of falls 1; mechanical fall while going up steps   LIVING ENVIRONMENT: Lives with: lives with their family Lives in: House/apartment Stairs: Yes: Internal: 14 steps; can reach both Has following equipment at home: None    OCCUPATION: Diplomatic Services operational officer work at [employer]  and she is on computer most of the day   PLOF: Independent  PATIENT GOALS: Decrease left shoulder pain and get back into exercise.   NEXT MD VISIT: None listed   OBJECTIVE:  Note: Objective measures were completed at Evaluation unless otherwise noted.  VITALS: BP 134/73 HR 83 SpO2 98%  DIAGNOSTIC FINDINGS:  CLINICAL DATA:  Chronic shoulder pain after a fall 2 years ago.   EXAM: LEFT SHOULDER - 2+ VIEW   COMPARISON:  None.   FINDINGS: There is no evidence of fracture or dislocation. There are moderate acromioclavicular and glenohumeral degenerative changes. High-riding humeral head raising the possibility of rotator cuff pathology. Regional soft tissues are unremarkable.   IMPRESSION: 1. Moderate degenerative changes in the left shoulder. 2. High-riding humeral head raising the possibility of rotator cuff pathology.     Electronically Signed   By: Emmaline Kluver M.D.   On: 04/08/2020 14:17  PATIENT SURVEYS:  Quick Dash 14% symptom score and  0% Work Disability   COGNITION: Overall cognitive status: Within functional limits for tasks assessed     SENSATION: WFL  POSTURE: Forward head and rounded shoulders   UPPER EXTREMITY ROM:   Active ROM Right eval Left eval  Shoulder flexion 160 160   Shoulder extension    Shoulder abduction 160 160   Shoulder adduction    Shoulder internal rotation    Shoulder external rotation    Elbow flexion    Elbow extension    Wrist flexion    Wrist extension    Wrist ulnar deviation    Wrist radial deviation    Wrist pronation    Wrist supination    (Blank rows = not tested)  UPPER EXTREMITY MMT:  MMT Right eval Left eval  Shoulder flexion 4+ 4+  Shoulder extension    Shoulder abduction 4+ 4+  Shoulder adduction    Shoulder internal rotation 4+ 4+  Shoulder external rotation 4 4*  Middle trapezius 4 4  Lower trapezius 4- 4-  Elbow flexion 4+ 4+  Elbow extension    Wrist flexion    Wrist extension     Wrist ulnar deviation    Wrist radial deviation    Wrist pronation  Wrist supination    Grip strength (lbs)    (Blank rows = not tested)  SHOULDER SPECIAL TESTS: Impingement tests: Neer impingement test: positive  and Painful arc test: positive  Rotator cuff assessment: Drop arm test: negative, Empty can test: negative, Full can test: negative, External rotation lag sign: negative, Belly press test: negative, and Infraspinatus test: negative Biceps assessment: Speed's test: positive    PALPATION:  Not performed                                                                                                                              TREATMENT DATE:   11/29/23: All exercises performed on LUE  THEREX   UBE with seat at 12 at 3 resistance- 2.5 min forward and 2.5 min backward   Eccentric Shoulder Flexion #2 DB 2 x 10  -Uses wall for support when sliding down the wall  -min VC to increase scapular retraction  Standing Single Arm Rows #10 1 x 10, #15 2 x 10  Standing Single Arm Lat Pull Downs #10 1 x 10, #5 2 x 10  -Pt struggles to bring arm through full ROM   PHYSICAL PERFORMANCE Shoulder ER Combined: Bilateral Occiput  Shoulder IR Combined: L4-L5    11/27/23: THEREX   UBE with seat at 12 at 3 resistance- 2.5 min forward and 2.5 min backward   Eccentric Left Shoulder Flexion 1 x 10  -Pt reports NRPS 3/10 at 70 deg  Supine Left Shoulder Flex with straight elbow 1 x 10  -pain at 60 deg  Supine Left Shoulder Flex with bent elbow   -pain at 60 deg   Seated Left Shoulder Flex with straight elbow 1 x 10  -mod VC to increased scapular retraction.  Seated Left Shoulder Flex with #1 DB 2 x 10 -VC with use of mirror to increase scapular retraction  Standing Rows with #35 1 x 10   Bent over Scapular Retraction with blue band on LUE 1 x 10   Bent over Scapular Retraction with black band on LUE 1 x 10  OMEGA Seated Rows #35 1 x 10  OMEGA Seated Rows #45 1 x 10 OMEGA Seated Rows  #50 1 x 10  Standing Horizontal Abduction with red  band 1 x 10  Standing Horizontal Abduction with green band 1 x 10    11/22/23:  THEREX  UBE seat at 12 2.5 MIN FORWARD AND 2.5 MIN Backward   OMEGA seated rows #30 2 x 10  -min VC to decrease retraction to thoracic side wall  Standing Rows with black band 1 x 10  Standing Rows with gray band 1 x 10  Push Up with Plus 1 x 10  Supine Serratus Punches #5 DB 1 x 10   NMR  Eccentric Shoulder Flexion on LLE #2 DB 1 x 10  -Pt reports increased left shoulder pain especially at 20 degrees  Eccentric Shoulder Flexion on LLE #2 DB 2  x 10 between 90 and 160 degrees  Left Shoulder Flexion with #2 DB 2 x 10 with emphasis on maintaining posture  -visual cue provided with mirror to maintain shoulders down and back and to avoid excessive upper trap activaiton  11/20/23: THEREX   UBE seat at 12 2.5 MIN FORWARD AND 2.5 MIN Backward   Eccentric Shoulder Flexion #2 DB to 180 degrees 1 x 10  -Pt reports increased pain when reaching 90 degrees of flexion from above 120 degrees .  Eccentric Shoulder Flexion #2 DB from 90 degrees 1 x 10  -Pt continues to experience increased pain at 30 degrees  Eccentric Shoulder Flexion #1 DB from 90 degrees 1 x 10  -Pt continues to experience increased pain at 30 degrees  Lower Trap Setting Wall Set  2 x 5  -min VC to maintain flexion of arms to avoid pain on LUE  -Pt reports increased pain in her left shoulder  Bent Over Y's 2 x 10 # 1 DB   Shoulder ER/IR at 0 deg abduction with Blue band 1 x 10  Shoulder ER/IR at 0 deg abduction with blue band anchored on door 2 x 10    PATIENT EDUCATION: Education details: Form and technique for correct performance of exercise and explanation about underlying pathology of left shoulder  Person educated: Patient Education method: Explanation, Demonstration, Verbal cues, and Handouts Education comprehension: verbalized understanding, returned demonstration, and verbal cues  required  HOME EXERCISE PROGRAM: Access Code: [email or web address] URL: [email or web address] Date: 11/27/2023 Prepared by: Ellin Goodie  Exercises - Seated Shoulder Abduction AAROM with Pulley at Side  - 1 x daily - 2 sets - 10 reps - Corner Pec Major Stretch  - 1 x daily - 3 reps - 30-60 sec  hold - Shoulder External Rotation with Anchored Resistance  - 3-4 x weekly - 3 sets - 10 reps - Shoulder Bent over Y's   - 3-4 x weekly - 3 sets - 10 reps - Standing Shoulder Flexion to 180 degrees with dumbbells   - 3-4 x weekly - 3 sets - 10 reps - Standing Shoulder Horizontal Abduction with Resistance  - 3-4 x weekly - 3 sets - 10 reps   ASSESSMENT:  CLINICAL IMPRESSION: Pt continues to be limited by left shoulder pain, but does show significant improvement with scapular retraction indicating ongoing impingement. She demonstrates improved periscapular strength as evidenced by her ability to perform strengthening exercises with increased resistance. She will continue to benefit from skilled PT to address these aforementioned deficits to return to using dominant hand for lifting and overhead activities without excessive pain and discomfort.   OBJECTIVE IMPAIRMENTS: decreased ROM and pain.   ACTIVITY LIMITATIONS: carrying, lifting, and reach over head  PARTICIPATION LIMITATIONS: cleaning, laundry, shopping, and community activity  PERSONAL FACTORS: Age and 3+ comorbidities: HTN, HLD, Obesity and prediabetes   are also affecting patient's functional outcome.   REHAB POTENTIAL: Good  CLINICAL DECISION MAKING: Stable/uncomplicated  EVALUATION COMPLEXITY: Low   GOALS: Goals reviewed with patient? No  SHORT TERM GOALS: Target date: 11/29/2023  Patient will demonstrate undestanding of home exercise plan by performing exercises correctly with evidence of good carry over with min to no verbal or tactile cues .   Baseline: NT 11/20/23: Performing independently  Goal status:  ACHIEVED   LONG TERM GOALS: Target date: 01/24/2024  Patient will improve left shoulder function as evidenced by an improvement in QuickDash score of >=10 pts to return to using LUE more reliably for self  grooming and house maintenance tasks.  Baseline: 14% disability 11/29/23 18% disability  Goal status: ONGOING   2.  Patient will improve left shoulder and periscapular strength by 1/3 grade MMT (4- to 4) for improved left shoulder function and to decrease left shoulder pain to complete typing and lifting tasks for work and home care tasks.  Baseline: Shoulder ER L 4, Middle Trap R/L 4/4, Lower R/L 4-/4- Goal status: ONGOING   3.  Patient will be able to perform combined left shoulder ER (touching occiput) and shoulder IR (L4-L5) without an increase in left shoulder pain >5/10 NRPS for improved grooming tasks and dressing herself without pain or discomfort.   Baseline: NT 11/29/23: Combined Left Shoulder IR and ER no NRPS  Goal status: ACHIEVED    PLAN:  PT FREQUENCY: 1-2x/week  PT DURATION: 10 weeks  PLANNED INTERVENTIONS: 97164- PT Re-evaluation, 97110-Therapeutic exercises, 97530- Therapeutic activity, 97112- Neuromuscular re-education, 97535- Self Care, 16109- Manual therapy, [phone]- Gait training, [phone]- Orthotic Fit/training, [phone]- Aquatic Therapy, [phone]- Electrical stimulation (unattended), [phone]- Electrical stimulation (manual), Patient/Family education, Balance training, Taping, Dry Needling, Joint mobilization, Joint manipulation, Spinal manipulation, Spinal mobilization, Cryotherapy, and Moist heat  PLAN FOR NEXT SESSION:  Reassess periscapular MMT. Progress periscapular exercises: Bent over shoulder T's, W's, and progress  Y's   Ellin Goodie PT, DPT  [facility] [address] [place], [address] Phone: [phone]   Fax:  [phone]

## 2023-12-04 ENCOUNTER — Ambulatory Visit: Payer: Medicare Other | Admitting: Physical Therapy

## 2023-12-04 DIAGNOSIS — M25511 Pain in right shoulder: Secondary | ICD-10-CM | POA: Diagnosis not present

## 2023-12-04 DIAGNOSIS — G8929 Other chronic pain: Secondary | ICD-10-CM | POA: Diagnosis not present

## 2023-12-04 DIAGNOSIS — M25512 Pain in left shoulder: Secondary | ICD-10-CM | POA: Diagnosis not present

## 2023-12-04 DIAGNOSIS — M542 Cervicalgia: Secondary | ICD-10-CM | POA: Diagnosis not present

## 2023-12-04 NOTE — Therapy (Signed)
 OUTPATIENT PHYSICAL THERAPY SHOULDER TREATMENT   Patient Name: Mary Wolfe MRN: [id number] DOB:[birth year], 73 y.o., female Today's Date: 12/04/2023  END OF SESSION:  PT End of Session - 12/04/23 0958     Visit Number 6    Number of Visits 15    Date for PT Re-Evaluation 01/03/24    Authorization Type Medicare 2025    Authorization - Visit Number 6    Authorization - Number of Visits 15    Progress Note Due on Visit 10    PT Start Time 0950    PT Stop Time 1030    PT Time Calculation (min) 40 min    Activity Tolerance Patient tolerated treatment well    Behavior During Therapy WFL for tasks assessed/performed               Past Medical History:  Diagnosis Date   Bursitis    Diverticulitis    Hyperlipidemia    Hypertension    Iron deficiency anemia    OA (osteoarthritis)    Obesity    PONV (postoperative nausea and vomiting)    Past Surgical History:  Procedure Laterality Date   CARPAL TUNNEL RELEASE Right    CESAREAN SECTION     COLONOSCOPY  03/14/2008   COLONOSCOPY WITH PROPOFOL N/A 06/25/2018   Procedure: COLONOSCOPY WITH PROPOFOL;  Surgeon: Christena Deem, MD;  Location: [facility] ENDOSCOPY;  Service: Endoscopy;  Laterality: N/A;   ESOPHAGOGASTRODUODENOSCOPY  04/01/1997   ESOPHAGOGASTRODUODENOSCOPY (EGD) WITH PROPOFOL N/A 06/25/2018   Procedure: ESOPHAGOGASTRODUODENOSCOPY (EGD) WITH PROPOFOL;  Surgeon: Christena Deem, MD;  Location: [facility] ENDOSCOPY;  Service: Endoscopy;  Laterality: N/A;   removed glass from elbow     from accident   TONSILLECTOMY     TOTAL HIP ARTHROPLASTY Right 04/16/2019   Procedure: Right Anterior Total Hip Arthroplasty;  Surgeon: Marcene Corning, MD;  Location: [facility] ORS;  Service: Orthopedics;  Laterality: Right;   Patient Active Problem List   Diagnosis Date Noted   URI with cough and congestion 10/06/2023   Trapezius strain 09/18/2023   Thumb injury 11/30/2022   Fall 11/30/2022   Prediabetes 06/26/2019   Greater trochanteric  bursitis of right hip 01/21/2019   Primary osteoarthritis of right hip 04/01/2016   Obesity 04/01/2016   Essential hypertension 01/31/2016   Hyperlipidemia 01/31/2016    PCP: Dr. Marikay Alar   REFERRING PROVIDER: Dr. Freddie Apley   REFERRING DIAG: [phone] (ICD-10-CM) - Left shoulder pain, unspecified chronicity   THERAPY DIAG:  Chronic left shoulder pain  Rationale for Evaluation and Treatment: Rehabilitation  ONSET DATE: 10/07/23   SUBJECTIVE:  SUBJECTIVE STATEMENT:  Pt reports that she felt some soreness in her left shoulder from the exercises, but that the soreness went away. She continues tom exp Hand dominance: Left  PERTINENT HISTORY: Pt states that she started to develop left shoulder pain after coming to physical therapy for right trap strain. She did experience a fall in 2021 where she fell on her left side. She did experience a lot of pain on her left side initially but the pain subsided until recently. She is able to sleep without pain or discomfort but continues to experience pain when reaching overhead or for objects out in front of her.   PAIN:  Are you having pain? Yes: NPRS scale: 2/10  Pain location: Anterior surface of left shoulder  Pain description: Achy  Aggravating factors: Reaching overhead.  Relieving factors: Shoulder Stretches   PRECAUTIONS: None  RED FLAGS: None   WEIGHT BEARING RESTRICTIONS: No  FALLS:  Has patient fallen in last 6 months? Yes. 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   NEXT MD VISIT: None listed   OBJECTIVE:  Note: Objective measures were completed at Evaluation unless otherwise noted.  VITALS: BP 134/73 HR 83 SpO2 98%  DIAGNOSTIC FINDINGS:  CLINICAL DATA:  Chronic shoulder pain after a fall 2 years ago.   EXAM: LEFT SHOULDER - 2+ VIEW   COMPARISON:  None.   FINDINGS: There is no evidence of fracture or dislocation. There are moderate acromioclavicular and glenohumeral degenerative changes. High-riding humeral head raising the possibility of rotator cuff pathology. Regional soft tissues are unremarkable.   IMPRESSION: 1. Moderate degenerative changes in the left shoulder. 2. High-riding humeral head raising the possibility of rotator cuff pathology.     Electronically Signed   By: Emmaline Kluver M.D.   On: 04/08/2020 14:17  PATIENT SURVEYS:  Quick Dash 14% symptom score and  0% Work Disability   COGNITION: Overall cognitive status: Within functional limits for tasks assessed     SENSATION: WFL  POSTURE: Forward head and rounded shoulders   UPPER EXTREMITY ROM:   Active ROM Right eval Left eval  Shoulder flexion 160 160   Shoulder extension    Shoulder abduction 160 160   Shoulder adduction    Shoulder internal rotation    Shoulder external rotation    Elbow flexion    Elbow extension    Wrist flexion    Wrist extension    Wrist ulnar deviation    Wrist radial deviation    Wrist pronation    Wrist supination    (Blank rows = not tested)  UPPER EXTREMITY MMT:  MMT Right eval Left eval  Shoulder flexion 4+ 4+  Shoulder extension    Shoulder abduction 4+ 4+  Shoulder adduction    Shoulder internal rotation 4+ 4+  Shoulder external rotation 4 4*  Middle trapezius 4 4  Lower trapezius 4- 4-  Elbow flexion 4+ 4+  Elbow extension    Wrist flexion    Wrist extension    Wrist ulnar deviation    Wrist radial deviation     Wrist pronation    Wrist supination  Grip strength (lbs)    (Blank rows = not tested)  SHOULDER SPECIAL TESTS: Impingement tests: Neer impingement test: positive  and Painful arc test: positive  Rotator cuff assessment: Drop arm test: negative, Empty can test: negative, Full can test: negative, External rotation lag sign: negative, Belly press test: negative, and Infraspinatus test: negative Biceps assessment: Speed's test: positive    PALPATION:  Not performed                                                                                                                              TREATMENT DATE:   12/04/23: PHYSICAL PERFORMANCE  Mid Trap R/L 4+/4+ Lower Trap R/L 4-/4  THEREX  OMEGA Lower Trap Set #25 2 x 10  OMEGA Lower Trap Set #35 1 x 10  OMEGA Face Pull #5 2 x 10  Face Pulls with red band 1 x 10  Bent Over Y's with #2 DB 1 x 10  Eccentric Shoulder Flexion on LUE #2 with mirror for Visual input 2 x 10   11/29/23: All exercises performed on LUE  THEREX   UBE with seat at 12 at 3 resistance- 2.5 min forward and 2.5 min backward   Eccentric Shoulder Flexion #2 DB 2 x 10  -Uses wall for support when sliding down the wall  -min VC to increase scapular retraction  Standing Single Arm Rows #10 1 x 10, #15 2 x 10  Standing Single Arm Lat Pull Downs #10 1 x 10, #5 2 x 10  -Pt struggles to bring arm through full ROM   PHYSICAL PERFORMANCE Shoulder ER Combined: Bilateral Occiput  Shoulder IR Combined: L4-L5    11/27/23: THEREX   UBE with seat at 12 at 3 resistance- 2.5 min forward and 2.5 min backward   Eccentric Left Shoulder Flexion 1 x 10  -Pt reports NRPS 3/10 at 70 deg  Supine Left Shoulder Flex with straight elbow 1 x 10  -pain at 60 deg  Supine Left Shoulder Flex with bent elbow   -pain at 60 deg   Seated Left Shoulder Flex with straight elbow 1 x 10  -mod VC to increased scapular retraction.  Seated Left Shoulder Flex with #1 DB 2 x 10 -VC with use of  mirror to increase scapular retraction  Standing Rows with #35 1 x 10   Bent over Scapular Retraction with blue band on LUE 1 x 10   Bent over Scapular Retraction with black band on LUE 1 x 10  OMEGA Seated Rows #35 1 x 10  OMEGA Seated Rows #45 1 x 10 OMEGA Seated Rows #50 1 x 10  Standing Horizontal Abduction with red  band 1 x 10  Standing Horizontal Abduction with green band 1 x 10    11/22/23:  THEREX  UBE seat at 12 2.5 MIN FORWARD AND 2.5 MIN Backward   OMEGA seated rows #30 2 x 10  -min VC to decrease retraction to thoracic  side wall  Standing Rows with black band 1 x 10  Standing Rows with gray band 1 x 10  Push Up with Plus 1 x 10  Supine Serratus Punches #5 DB 1 x 10   NMR  Eccentric Shoulder Flexion on LLE #2 DB 1 x 10  -Pt reports increased left shoulder pain especially at 20 degrees  Eccentric Shoulder Flexion on LLE #2 DB 2 x 10 between 90 and 160 degrees  Left Shoulder Flexion with #2 DB 2 x 10 with emphasis on maintaining posture  -visual cue provided with mirror to maintain shoulders down and back and to avoid excessive upper trap activaiton  11/20/23: THEREX   UBE seat at 12 2.5 MIN FORWARD AND 2.5 MIN Backward   Eccentric Shoulder Flexion #2 DB to 180 degrees 1 x 10  -Pt reports increased pain when reaching 90 degrees of flexion from above 120 degrees .  Eccentric Shoulder Flexion #2 DB from 90 degrees 1 x 10  -Pt continues to experience increased pain at 30 degrees  Eccentric Shoulder Flexion #1 DB from 90 degrees 1 x 10  -Pt continues to experience increased pain at 30 degrees  Lower Trap Setting Wall Set  2 x 5  -min VC to maintain flexion of arms to avoid pain on LUE  -Pt reports increased pain in her left shoulder  Bent Over Y's 2 x 10 # 1 DB   Shoulder ER/IR at 0 deg abduction with Blue band 1 x 10  Shoulder ER/IR at 0 deg abduction with blue band anchored on door 2 x 10    PATIENT EDUCATION: Education details: Form and technique for correct  performance of exercise and explanation about underlying pathology of left shoulder  Person educated: Patient Education method: Explanation, Demonstration, Verbal cues, and Handouts Education comprehension: verbalized understanding, returned demonstration, and verbal cues required  HOME EXERCISE PROGRAM: Access Code: [email or web address] URL: [email or web address] Date: 12/04/2023 Prepared by: Ellin Goodie  Exercises - Seated Shoulder Abduction AAROM with Pulley at Side  - 1 x daily - 2 sets - 10 reps - Corner Pec Major Stretch  - 1 x daily - 3 reps - 30-60 sec  hold - Shoulder External Rotation with Anchored Resistance  - 3-4 x weekly - 3 sets - 10 reps - Shoulder Bent over Y's   - 3-4 x weekly - 3 sets - 10 reps - Standing Shoulder Flexion to 180 degrees with dumbbells   - 3-4 x weekly - 2 sets - 10 reps - Standing Shoulder Horizontal Abduction with Resistance  - 3-4 x weekly - 3 sets - 10 reps - Face Pulls  - 3-4 x weekly - 3 sets - 10 reps   ASSESSMENT:  CLINICAL IMPRESSION: Pt demonstrates improvement in left shoulder strength but she continues to have left lower trap weakness. She does show improvement with left shoulder strength as evidenced by ability to perform eccentric flexion with increased weight. She will continue to benefit from skilled PT to address these aforementioned deficits to return to using dominant hand for lifting and overhead activities without excessive pain and discomfort.  OBJECTIVE IMPAIRMENTS: decreased ROM and pain.   ACTIVITY LIMITATIONS: carrying, lifting, and reach over head  PARTICIPATION LIMITATIONS: cleaning, laundry, shopping, and community activity  PERSONAL FACTORS: Age and 3+ comorbidities: HTN, HLD, Obesity and prediabetes   are also affecting patient's functional outcome.   REHAB POTENTIAL: Good  CLINICAL DECISION MAKING: Stable/uncomplicated  EVALUATION COMPLEXITY: Low   GOALS: Goals reviewed with patient? No  SHORT TERM  GOALS: Target date: 11/29/2023  Patient will demonstrate undestanding of home exercise plan by performing exercises correctly with evidence of good carry over with min to no verbal or tactile cues .   Baseline: NT 11/20/23: Performing independently  Goal status: ACHIEVED   LONG TERM GOALS: Target date: 01/24/2024  Patient will improve left shoulder function as evidenced by an improvement in QuickDash score of >=10 pts to return to using LUE more reliably for self grooming and house maintenance tasks.  Baseline: 14% disability 11/29/23 18% disability  Goal status: ONGOING   2.  Patient will improve left shoulder and periscapular strength by 1/3 grade MMT (4- to 4) for improved left shoulder function and to decrease left shoulder pain to complete typing and lifting tasks for work and home care tasks.  Baseline: Shoulder ER L 4, Middle Trap R/L 4/4, Lower R/L 4-/4- 12/04/23: Middle Trap R/L 4+/4+, Lower R/L 4/4- Goal status: ONGOING   3.  Patient will be able to perform combined left shoulder ER (touching occiput) and shoulder IR (L4-L5) without an increase in left shoulder pain >5/10 NRPS for improved grooming tasks and dressing herself without pain or discomfort.   Baseline: NT 11/29/23: Combined Left Shoulder IR and ER no NRPS  Goal status: ACHIEVED    PLAN:  PT FREQUENCY: 1-2x/week  PT DURATION: 10 weeks  PLANNED INTERVENTIONS: 97164- PT Re-evaluation, 97110-Therapeutic exercises, 97530- Therapeutic activity, 97112- Neuromuscular re-education, 97535- Self Care, 40981- Manual therapy, [phone]- Gait training, [phone]- Orthotic Fit/training, [phone]- Aquatic Therapy, [phone]- Electrical stimulation (unattended), [phone]- Electrical stimulation (manual), Patient/Family education, Balance training, Taping, Dry Needling, Joint mobilization, Joint manipulation, Spinal manipulation, Spinal mobilization, Cryotherapy, and Moist heat  PLAN FOR NEXT SESSION:  Test Shoulder ER left side. STM for upper traps.   Progress shoulder and periscapular exercises: Bent over shoulder T's and eccentric shoulder flexion and lower trap set.   Ellin Goodie PT, DPT  [facility] [address], [place], [address] Phone: [phone]   Fax:  [phone]

## 2023-12-06 ENCOUNTER — Ambulatory Visit: Payer: Medicare Other | Attending: Family Medicine | Admitting: Physical Therapy

## 2023-12-06 DIAGNOSIS — G8929 Other chronic pain: Secondary | ICD-10-CM | POA: Diagnosis not present

## 2023-12-06 DIAGNOSIS — M25512 Pain in left shoulder: Secondary | ICD-10-CM | POA: Diagnosis not present

## 2023-12-06 DIAGNOSIS — M25511 Pain in right shoulder: Secondary | ICD-10-CM | POA: Insufficient documentation

## 2023-12-06 DIAGNOSIS — M542 Cervicalgia: Secondary | ICD-10-CM | POA: Insufficient documentation

## 2023-12-06 NOTE — Therapy (Signed)
 OUTPATIENT PHYSICAL THERAPY SHOULDER TREATMENT   Patient Name: Mary Wolfe MRN: [id number] DOB:04/10/1951, 73 y.o., female Today's Date: 12/06/2023  END OF SESSION:  PT End of Session - 12/06/23 0954     Visit Number 7    Number of Visits 15    Date for PT Re-Evaluation 01/03/24    Authorization Type Medicare 2025    Authorization - Visit Number 7    Authorization - Number of Visits 15    Progress Note Due on Visit 10    PT Start Time 0950    PT Stop Time 1030    PT Time Calculation (min) 40 min    Activity Tolerance Patient tolerated treatment well    Behavior During Therapy WFL for tasks assessed/performed               Past Medical History:  Diagnosis Date   Bursitis    Diverticulitis    Hyperlipidemia    Hypertension    Iron deficiency anemia    OA (osteoarthritis)    Obesity    PONV (postoperative nausea and vomiting)    Past Surgical History:  Procedure Laterality Date   CARPAL TUNNEL RELEASE Right    CESAREAN SECTION     COLONOSCOPY  03/14/2008   COLONOSCOPY WITH PROPOFOL N/A 06/25/2018   Procedure: COLONOSCOPY WITH PROPOFOL;  Surgeon: Christena Deem, MD;  Location: [facility] ENDOSCOPY;  Service: Endoscopy;  Laterality: N/A;   ESOPHAGOGASTRODUODENOSCOPY  04/01/1997   ESOPHAGOGASTRODUODENOSCOPY (EGD) WITH PROPOFOL N/A 06/25/2018   Procedure: ESOPHAGOGASTRODUODENOSCOPY (EGD) WITH PROPOFOL;  Surgeon: Christena Deem, MD;  Location: [facility] ENDOSCOPY;  Service: Endoscopy;  Laterality: N/A;   removed glass from elbow     from accident   TONSILLECTOMY     TOTAL HIP ARTHROPLASTY Right 04/16/2019   Procedure: Right Anterior Total Hip Arthroplasty;  Surgeon: Marcene Corning, MD;  Location: [facility] ORS;  Service: Orthopedics;  Laterality: Right;   Patient Active Problem List   Diagnosis Date Noted   URI with cough and congestion 10/06/2023   Trapezius strain 09/18/2023   Thumb injury 11/30/2022   Fall 11/30/2022   Prediabetes 06/26/2019   Greater trochanteric  bursitis of right hip 01/21/2019   Primary osteoarthritis of right hip 04/01/2016   Obesity 04/01/2016   Essential hypertension 01/31/2016   Hyperlipidemia 01/31/2016    PCP: Dr. Marikay Alar   REFERRING PROVIDER: Dr. Freddie Apley   REFERRING DIAG: [phone] (ICD-10-CM) - Left shoulder pain, unspecified chronicity   THERAPY DIAG:  Chronic left shoulder pain  Neck pain on right side  Acute pain of right shoulder  Rationale for Evaluation and Treatment: Rehabilitation  ONSET DATE: 10/07/23   SUBJECTIVE:  SUBJECTIVE STATEMENT:  Pt states that her exercises have been going well and that she is experiencing less pain in left shoulder when performing exercises.  Hand dominance: Left  PERTINENT HISTORY: Pt states that she started to develop left shoulder pain after coming to physical therapy for right trap strain. She did experience a fall in 2021 where she fell on her left side. She did experience a lot of pain on her left side initially but the pain subsided until recently. She is able to sleep without pain or discomfort but continues to experience pain when reaching overhead or for objects out in front of her.   PAIN:  Are you having pain? Yes: NPRS scale: 2/10  Pain location: Anterior surface of left shoulder  Pain description: Achy  Aggravating factors: Reaching overhead.  Relieving factors: Shoulder Stretches   PRECAUTIONS: None  RED FLAGS: None   WEIGHT BEARING RESTRICTIONS: No  FALLS:  Has patient fallen in last 6 months? Yes. 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   NEXT MD VISIT: None listed   OBJECTIVE:  Note: Objective measures were completed at Evaluation unless otherwise noted.  VITALS: BP 134/73 HR 83 SpO2 98%  DIAGNOSTIC FINDINGS:  CLINICAL DATA:  Chronic shoulder pain after a fall 2 years ago.   EXAM: LEFT SHOULDER - 2+ VIEW   COMPARISON:  None.   FINDINGS: There is no evidence of fracture or dislocation. There are moderate acromioclavicular and glenohumeral degenerative changes. High-riding humeral head raising the possibility of rotator cuff pathology. Regional soft tissues are unremarkable.   IMPRESSION: 1. Moderate degenerative changes in the left shoulder. 2. High-riding humeral head raising the possibility of rotator cuff pathology.     Electronically Signed   By: Emmaline Kluver M.D.   On: 04/08/2020 14:17  PATIENT SURVEYS:  Quick Dash 14% symptom score and  0% Work Disability   COGNITION: Overall cognitive status: Within functional limits for tasks assessed     SENSATION: WFL  POSTURE: Forward head and rounded shoulders   UPPER EXTREMITY ROM:   Active ROM Right eval Left eval  Shoulder flexion 160 160   Shoulder extension    Shoulder abduction 160 160   Shoulder adduction    Shoulder internal rotation    Shoulder external rotation    Elbow flexion    Elbow extension    Wrist flexion    Wrist extension    Wrist ulnar deviation    Wrist radial deviation    Wrist pronation    Wrist supination    (Blank rows = not tested)  UPPER EXTREMITY MMT:  MMT Right eval Left eval  Shoulder flexion 4+ 4+  Shoulder extension    Shoulder abduction 4+ 4+  Shoulder adduction    Shoulder internal rotation 4+ 4+  Shoulder external rotation 4 4*  Middle trapezius 4 4  Lower trapezius 4- 4-  Elbow flexion 4+ 4+  Elbow extension    Wrist flexion    Wrist  extension    Wrist ulnar deviation    Wrist radial deviation    Wrist pronation    Wrist supination  Grip strength (lbs)    (Blank rows = not tested)  SHOULDER SPECIAL TESTS: Impingement tests: Neer impingement test: positive  and Painful arc test: positive  Rotator cuff assessment: Drop arm test: negative, Empty can test: negative, Full can test: negative, External rotation lag sign: negative, Belly press test: negative, and Infraspinatus test: negative Biceps assessment: Speed's test: positive    PALPATION:  Not performed                                                                                                                              TREATMENT DATE:   12/06/23: THEREX   UBE seat at 10 with resistance at 4- 2.5 forward and 2.5 backward  OMEGA Lower Trap Set #35 1 x 10  OMEGA Lower Trap Set #45 2 x 10  Bent Over Y's  #3 DB 3 x 10  Bent Over T's #3 DB 1 x 10  Bent Over T's #5 DB 1 x 10  Serratus Wall Slides 2 x 8  -min VC to stop at point when she starts to experience left anterior shoulder pain, around 120 degrees flexion on LUE     12/04/23: PHYSICAL PERFORMANCE  Mid Trap R/L 4+/4+ Lower Trap R/L 4-/4  THEREX  OMEGA Lower Trap Set #25 2 x 10  OMEGA Lower Trap Set #35 1 x 10  OMEGA Face Pull #5 2 x 10  Face Pulls with red band 1 x 10  Bent Over Y's with #2 DB 1 x 10  Eccentric Shoulder Flexion on LUE #2 with mirror for Visual input 2 x 10   11/29/23: All exercises performed on LUE  THEREX   UBE with seat at 12 at 3 resistance- 2.5 min forward and 2.5 min backward   Eccentric Shoulder Flexion #2 DB 2 x 10  -Uses wall for support when sliding down the wall  -min VC to increase scapular retraction  Standing Single Arm Rows #10 1 x 10, #15 2 x 10  Standing Single Arm Lat Pull Downs #10 1 x 10, #5 2 x 10  -Pt struggles to bring arm through full ROM   PHYSICAL PERFORMANCE Shoulder ER Combined: Bilateral Occiput  Shoulder IR Combined: L4-L5     11/27/23: THEREX   UBE with seat at 12 at 3 resistance- 2.5 min forward and 2.5 min backward   Eccentric Left Shoulder Flexion 1 x 10  -Pt reports NRPS 3/10 at 70 deg  Supine Left Shoulder Flex with straight elbow 1 x 10  -pain at 60 deg  Supine Left Shoulder Flex with bent elbow   -pain at 60 deg   Seated Left Shoulder Flex with straight elbow 1 x 10  -mod VC to increased scapular retraction.  Seated Left Shoulder Flex with #1 DB 2 x 10 -VC with use of mirror to increase scapular retraction  Standing Rows with #35 1 x 10   Bent over Scapular Retraction with blue band on LUE  1 x 10   Bent over Scapular Retraction with black band on LUE 1 x 10  OMEGA Seated Rows #35 1 x 10  OMEGA Seated Rows #45 1 x 10 OMEGA Seated Rows #50 1 x 10  Standing Horizontal Abduction with red  band 1 x 10  Standing Horizontal Abduction with green band 1 x 10    11/22/23:  THEREX  UBE seat at 12 2.5 MIN FORWARD AND 2.5 MIN Backward   OMEGA seated rows #30 2 x 10  -min VC to decrease retraction to thoracic side wall  Standing Rows with black band 1 x 10  Standing Rows with gray band 1 x 10  Push Up with Plus 1 x 10  Supine Serratus Punches #5 DB 1 x 10   NMR  Eccentric Shoulder Flexion on LLE #2 DB 1 x 10  -Pt reports increased left shoulder pain especially at 20 degrees  Eccentric Shoulder Flexion on LLE #2 DB 2 x 10 between 90 and 160 degrees  Left Shoulder Flexion with #2 DB 2 x 10 with emphasis on maintaining posture  -visual cue provided with mirror to maintain shoulders down and back and to avoid excessive upper trap activaiton    PATIENT EDUCATION: Education details: Form and technique for correct performance of exercise and explanation about underlying pathology of left shoulder  Person educated: Patient Education method: Programmer, multimedia, Demonstration, Verbal cues, and Handouts Education comprehension: verbalized understanding, returned demonstration, and verbal cues required  HOME  EXERCISE PROGRAM: Access Code: [email or web address] URL: [email or web address] Date: 12/06/2023 Prepared by: Ellin Goodie  Exercises - Seated Shoulder Abduction AAROM with Pulley at Side  - 1 x daily - 2 sets - 10 reps - Corner Pec Major Stretch  - 1 x daily - 3 reps - 30-60 sec  hold - Shoulder External Rotation with Anchored Resistance  - 3-4 x weekly - 3 sets - 10 reps - Shoulder Bent over Y's   - 3-4 x weekly - 3 sets - 10 reps - Single Arm Bent Over Shoulder Horizontal Abduction with Dumbbell - Palm Down  - 3-4 x weekly - 3 sets - 10 reps - Standing Shoulder Flexion to 180 degrees with dumbbells   - 3-4 x weekly - 2 sets - 10 reps - Face Pulls  - 3-4 x weekly - 3 sets - 10 reps - Serratus Activation at Wall  - 3-4 x weekly - 3 sets - 8 reps   ASSESSMENT:  CLINICAL IMPRESSION: Pt continues to progress towards goals with ability to perform periscapular exercises with increased resistance. She also shows decreased pain response to overhead UE activity with ability to perform shoulder flexion and beyond 90 degrees with a minimal increase in left shoulder pain. She will continue to benefit from skilled PT to address these aforementioned deficits to return to using dominant hand for lifting and overhead activities without excessive pain and discomfort.  OBJECTIVE IMPAIRMENTS: decreased ROM and pain.   ACTIVITY LIMITATIONS: carrying, lifting, and reach over head  PARTICIPATION LIMITATIONS: cleaning, laundry, shopping, and community activity  PERSONAL FACTORS: Age and 3+ comorbidities: HTN, HLD, Obesity and prediabetes   are also affecting patient's functional outcome.   REHAB POTENTIAL: Good  CLINICAL DECISION MAKING: Stable/uncomplicated  EVALUATION COMPLEXITY: Low   GOALS: Goals reviewed with patient? No  SHORT TERM GOALS: Target date: 11/29/2023  Patient will demonstrate undestanding of home exercise plan by performing exercises correctly with evidence of good carry over  with min to no verbal or tactile cues .  Baseline: NT 11/20/23: Performing independently  Goal status: ACHIEVED   LONG TERM GOALS: Target date: 01/24/2024  Patient will improve left shoulder function as evidenced by an improvement in QuickDash score of >=10 pts to return to using LUE more reliably for self grooming and house maintenance tasks.  Baseline: 14% disability 11/29/23 18% disability  Goal status: ONGOING   2.  Patient will improve left shoulder and periscapular strength by 1/3 grade MMT (4- to 4) for improved left shoulder function and to decrease left shoulder pain to complete typing and lifting tasks for work and home care tasks.  Baseline: Shoulder ER L 4, Middle Trap R/L 4/4, Lower R/L 4-/4- 12/04/23: Middle Trap R/L 4+/4+, Lower R/L 4/4- Goal status: ONGOING   3.  Patient will be able to perform combined left shoulder ER (touching occiput) and shoulder IR (L4-L5) without an increase in left shoulder pain >5/10 NRPS for improved grooming tasks and dressing herself without pain or discomfort.   Baseline: NT 11/29/23: Combined Left Shoulder IR and ER no NRPS  Goal status: ACHIEVED    PLAN:  PT FREQUENCY: 1-2x/week  PT DURATION: 10 weeks  PLANNED INTERVENTIONS: 97164- PT Re-evaluation, 97110-Therapeutic exercises, 97530- Therapeutic activity, 97112- Neuromuscular re-education, 97535- Self Care, 16109- Manual therapy, [phone]- Gait training, [phone]- Orthotic Fit/training, [phone]- Aquatic Therapy, [phone]- Electrical stimulation (unattended), [phone]- Electrical stimulation (manual), Patient/Family education, Balance training, Taping, Dry Needling, Joint mobilization, Joint manipulation, Spinal manipulation, Spinal mobilization, Cryotherapy, and Moist heat  PLAN FOR NEXT SESSION:  Test Shoulder ER left side. STM for upper traps.  Continue to progress periscapular strength with lower trap setting on OMEGA machine and face pulls.   Ellin Goodie PT, DPT  [facility] [address], [place], [address] Phone: [phone]   Fax:  [phone]

## 2023-12-11 ENCOUNTER — Ambulatory Visit: Payer: Medicare Other | Admitting: Physical Therapy

## 2023-12-11 DIAGNOSIS — M542 Cervicalgia: Secondary | ICD-10-CM | POA: Diagnosis not present

## 2023-12-11 DIAGNOSIS — M25511 Pain in right shoulder: Secondary | ICD-10-CM

## 2023-12-11 DIAGNOSIS — G8929 Other chronic pain: Secondary | ICD-10-CM | POA: Diagnosis not present

## 2023-12-11 DIAGNOSIS — M25512 Pain in left shoulder: Secondary | ICD-10-CM | POA: Diagnosis not present

## 2023-12-11 NOTE — Therapy (Signed)
 OUTPATIENT PHYSICAL THERAPY SHOULDER TREATMENT   Patient Name: Mary Wolfe MRN: [id number] DOB:1951/04/19, 73 y.o., female Today's Date: 12/11/2023  END OF SESSION:  PT End of Session - 12/11/23 0950     Visit Number 8    Number of Visits 15    Date for PT Re-Evaluation 01/03/24    Authorization Type Medicare 2025    Authorization - Visit Number 8    Authorization - Number of Visits 15    Progress Note Due on Visit 10    PT Start Time 0946    PT Stop Time 1030    PT Time Calculation (min) 44 min    Activity Tolerance Patient tolerated treatment well    Behavior During Therapy WFL for tasks assessed/performed               Past Medical History:  Diagnosis Date   Bursitis    Diverticulitis    Hyperlipidemia    Hypertension    Iron deficiency anemia    OA (osteoarthritis)    Obesity    PONV (postoperative nausea and vomiting)    Past Surgical History:  Procedure Laterality Date   CARPAL TUNNEL RELEASE Right    CESAREAN SECTION     COLONOSCOPY  03/14/2008   COLONOSCOPY WITH PROPOFOL N/A 06/25/2018   Procedure: COLONOSCOPY WITH PROPOFOL;  Surgeon: Christena Deem, MD;  Location: [facility] ENDOSCOPY;  Service: Endoscopy;  Laterality: N/A;   ESOPHAGOGASTRODUODENOSCOPY  04/01/1997   ESOPHAGOGASTRODUODENOSCOPY (EGD) WITH PROPOFOL N/A 06/25/2018   Procedure: ESOPHAGOGASTRODUODENOSCOPY (EGD) WITH PROPOFOL;  Surgeon: Christena Deem, MD;  Location: [facility] ENDOSCOPY;  Service: Endoscopy;  Laterality: N/A;   removed glass from elbow     from accident   TONSILLECTOMY     TOTAL HIP ARTHROPLASTY Right 04/16/2019   Procedure: Right Anterior Total Hip Arthroplasty;  Surgeon: Marcene Corning, MD;  Location: [facility] ORS;  Service: Orthopedics;  Laterality: Right;   Patient Active Problem List   Diagnosis Date Noted   URI with cough and congestion 10/06/2023   Trapezius strain 09/18/2023   Thumb injury 11/30/2022   Fall 11/30/2022   Prediabetes 06/26/2019   Greater trochanteric  bursitis of right hip 01/21/2019   Primary osteoarthritis of right hip 04/01/2016   Obesity 04/01/2016   Essential hypertension 01/31/2016   Hyperlipidemia 01/31/2016    PCP: Dr. Marikay Alar   REFERRING PROVIDER: Dr. Freddie Apley   REFERRING DIAG: [phone] (ICD-10-CM) - Left shoulder pain, unspecified chronicity   THERAPY DIAG:  Chronic left shoulder pain  Neck pain on right side  Acute pain of right shoulder  Rationale for Evaluation and Treatment: Rehabilitation  ONSET DATE: 10/07/23   SUBJECTIVE:  SUBJECTIVE STATEMENT:  Pt reports that she only had an issue with the wall slides, but aside from that her shoulders . She continues to notice improvement in her shoulder pain to the point where she can now sleep without pain or discomfort.   Hand dominance: Left  PERTINENT HISTORY: Pt states that she started to develop left shoulder pain after coming to physical therapy for right trap strain. She did experience a fall in 2021 where she fell on her left side. She did experience a lot of pain on her left side initially but the pain subsided until recently. She is able to sleep without pain or discomfort but continues to experience pain when reaching overhead or for objects out in front of her.   PAIN:  Are you having pain? Yes: NPRS scale: 1.5/10  Pain location: Anterior surface of left shoulder  Pain description: Achy  Aggravating factors: Reaching overhead.  Relieving factors: Shoulder Stretches   PRECAUTIONS: None  RED FLAGS: None   WEIGHT BEARING RESTRICTIONS: No  FALLS:  Has patient fallen in last 6 months? Yes. 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   NEXT MD VISIT: None listed   OBJECTIVE:  Note: Objective measures were completed at Evaluation unless otherwise noted.  VITALS: BP 134/73 HR 83 SpO2 98%  DIAGNOSTIC FINDINGS:  CLINICAL DATA:  Chronic shoulder pain after a fall 2 years ago.   EXAM: LEFT SHOULDER - 2+ VIEW   COMPARISON:  None.   FINDINGS: There is no evidence of fracture or dislocation. There are moderate acromioclavicular and glenohumeral degenerative changes. High-riding humeral head raising the possibility of rotator cuff pathology. Regional soft tissues are unremarkable.   IMPRESSION: 1. Moderate degenerative changes in the left shoulder. 2. High-riding humeral head raising the possibility of rotator cuff pathology.     Electronically Signed   By: Emmaline Kluver M.D.   On: 04/08/2020 14:17  PATIENT SURVEYS:  Quick Dash 14% symptom score and  0% Work Disability   COGNITION: Overall cognitive status: Within functional limits for tasks assessed     SENSATION: WFL  POSTURE: Forward head and rounded shoulders   UPPER EXTREMITY ROM:   Active ROM Right eval Left eval  Shoulder flexion 160 160   Shoulder extension    Shoulder abduction 160 160   Shoulder adduction    Shoulder internal rotation    Shoulder external rotation    Elbow flexion    Elbow extension    Wrist flexion    Wrist extension    Wrist ulnar deviation    Wrist radial deviation    Wrist pronation    Wrist supination    (Blank rows = not tested)  UPPER EXTREMITY MMT:  MMT Right eval Left eval Right 12/11/23 Left 12/11/23   Shoulder flexion 4+ 4+    Shoulder extension      Shoulder abduction 4+ 4+    Shoulder adduction      Shoulder internal rotation (90 abd) 4+ 4+ 4+ 4+  Shoulder external  rotation (90 abd) 4 4*  4  Middle trapezius 4 4 4+ 4+  Lower trapezius 4- 4- 4  4-  Elbow flexion 4+ 4+    Elbow extension      Wrist flexion      Wrist extension      Wrist ulnar deviation      Wrist radial deviation      Wrist pronation      Wrist supination      Grip strength (lbs)      (Blank rows = not tested)  SHOULDER SPECIAL TESTS: Impingement tests: Neer impingement test: positive  and Painful arc test: positive  Rotator cuff assessment: Drop arm test: negative, Empty can test: negative, Full can test: negative, External rotation lag sign: negative, Belly press test: negative, and Infraspinatus test: negative Biceps assessment: Speed's test: positive    PALPATION:  Not performed                                                                                                                              TREATMENT DATE:   12/11/23: THEREX   UBE seat at 10 with resistance at 4- 2.5 forward and 2.5 backward  OMEGA Lower Trap Set #45 3 x 10  Supine Dumbbell Pull Over #5 in RUE and LUE 3 x 10  Standing Horizontal Abduction with red band 3 x 10  -min VC to maintain extended elbows  Shoulder ER at 90 deg ABd R/L 4/4, Should IR at 90 deg Abd R/L 4/4 Shoulder ER and IR at 0 deg abduction R/L  4+/4+   12/06/23: THEREX   UBE seat at 10 with resistance at 4- 2.5 forward and 2.5 backward  OMEGA Lower Trap Set #35 1 x 10  OMEGA Lower Trap Set #45 2 x 10  Bent Over Y's  #3 DB 3 x 10  Bent Over T's #3 DB 1 x 10  Bent Over T's #5 DB 1 x 10  Serratus Wall Slides 2 x 8  -min VC to stop at point when she starts to experience left anterior shoulder pain, around 120 degrees flexion on LUE     12/04/23: PHYSICAL PERFORMANCE  Mid Trap R/L 4+/4+ Lower Trap R/L 4-/4  THEREX  OMEGA Lower Trap Set #25 2 x 10  OMEGA Lower Trap Set #35 1 x 10  OMEGA Face Pull #5 2 x 10  Face Pulls with red band 1 x 10  Bent Over Y's with #2 DB 1 x 10  Eccentric Shoulder Flexion on LUE #2 with mirror for  Visual input 2 x 10   11/29/23: All exercises performed on LUE  THEREX   UBE with seat at 12 at 3 resistance- 2.5 min forward and 2.5 min backward   Eccentric Shoulder Flexion #2 DB 2 x 10  -Uses wall for support when sliding down the wall  -min VC to increase scapular retraction  Standing Single Arm Rows #10 1 x 10, #15 2 x 10  Standing Single Arm Lat Pull Downs #10 1 x 10, #5 2 x 10  -Pt  struggles to bring arm through full ROM   PHYSICAL PERFORMANCE Shoulder ER Combined: Bilateral Occiput  Shoulder IR Combined: L4-L5    11/27/23: THEREX   UBE with seat at 12 at 3 resistance- 2.5 min forward and 2.5 min backward   Eccentric Left Shoulder Flexion 1 x 10  -Pt reports NRPS 3/10 at 70 deg  Supine Left Shoulder Flex with straight elbow 1 x 10  -pain at 60 deg  Supine Left Shoulder Flex with bent elbow   -pain at 60 deg   Seated Left Shoulder Flex with straight elbow 1 x 10  -mod VC to increased scapular retraction.  Seated Left Shoulder Flex with #1 DB 2 x 10 -VC with use of mirror to increase scapular retraction  Standing Rows with #35 1 x 10   Bent over Scapular Retraction with blue band on LUE 1 x 10   Bent over Scapular Retraction with black band on LUE 1 x 10  OMEGA Seated Rows #35 1 x 10  OMEGA Seated Rows #45 1 x 10 OMEGA Seated Rows #50 1 x 10  Standing Horizontal Abduction with red  band 1 x 10  Standing Horizontal Abduction with green band 1 x 10    PATIENT EDUCATION: Education details: Form and technique for correct performance of exercise and explanation about underlying pathology of left shoulder  Person educated: Patient Education method: Explanation, Demonstration, Verbal cues, and Handouts Education comprehension: verbalized understanding, returned demonstration, and verbal cues required  HOME EXERCISE PROGRAM: Access Code: [email or web address] URL: [email or web address] Date: 12/06/2023 Prepared by: Ellin Goodie  Exercises - Seated Shoulder  Abduction AAROM with Pulley at Side  - 1 x daily - 2 sets - 10 reps - Corner Pec Major Stretch  - 1 x daily - 3 reps - 30-60 sec  hold - Shoulder External Rotation with Anchored Resistance  - 3-4 x weekly - 3 sets - 10 reps - Shoulder Bent over Y's   - 3-4 x weekly - 3 sets - 10 reps - Single Arm Bent Over Shoulder Horizontal Abduction with Dumbbell - Palm Down  - 3-4 x weekly - 3 sets - 10 reps - Standing Shoulder Flexion to 180 degrees with dumbbells   - 3-4 x weekly - 2 sets - 10 reps - Face Pulls  - 3-4 x weekly - 3 sets - 10 reps - Serratus Activation at Wall  - 3-4 x weekly - 3 sets - 8 reps   ASSESSMENT:  CLINICAL IMPRESSION: Pt shows improvement with shoulder strength with increase shoulder external rotation and without an increase in pain. She also demonstrates improvement in periscapular and shoulder strength and decrease in pain with ability to perform banded shoulder horizontal abduction with proper form and with no increase in pain, which was not the case with earlier session. She will continue to benefit from skilled PT to address these aforementioned deficits to return to using dominant hand for lifting and overhead activities without excessive pain and discomfort.  OBJECTIVE IMPAIRMENTS: decreased ROM and pain.   ACTIVITY LIMITATIONS: carrying, lifting, and reach over head  PARTICIPATION LIMITATIONS: cleaning, laundry, shopping, and community activity  PERSONAL FACTORS: Age and 3+ comorbidities: HTN, HLD, Obesity and prediabetes   are also affecting patient's functional outcome.   REHAB POTENTIAL: Good  CLINICAL DECISION MAKING: Stable/uncomplicated  EVALUATION COMPLEXITY: Low   GOALS: Goals reviewed with patient? No  SHORT TERM GOALS: Target date: 11/29/2023  Patient will demonstrate undestanding of home exercise plan by performing exercises correctly with evidence  of good carry over with min to no verbal or tactile cues .   Baseline: NT 11/20/23: Performing  independently  Goal status: ACHIEVED   LONG TERM GOALS: Target date: 01/24/2024  Patient will improve left shoulder function as evidenced by an improvement in QuickDash score of >=10 pts to return to using LUE more reliably for self grooming and house maintenance tasks.  Baseline: 14% disability 11/29/23 18% disability  Goal status: ONGOING   2.  Patient will improve left shoulder and periscapular strength by 1/3 grade MMT (4- to 4) for improved left shoulder function and to decrease left shoulder pain to complete typing and lifting tasks for work and home care tasks.  Baseline: Shoulder ER L 4, Middle Trap R/L 4/4, Lower R/L 4-/4- 12/04/23: Middle Trap R/L 4+/4+, Lower R/L 4/4-  12/11/23: Shoulder ER R/L 4/4, Should IR R/L 4/4 Goal status: ONGOING   3.  Patient will be able to perform combined left shoulder ER (touching occiput) and shoulder IR (L4-L5) without an increase in left shoulder pain >5/10 NRPS for improved grooming tasks and dressing herself without pain or discomfort.   Baseline: NT 11/29/23: Combined Left Shoulder IR and ER no NRPS  Goal status: ACHIEVED    PLAN:  PT FREQUENCY: 1-2x/week  PT DURATION: 10 weeks  PLANNED INTERVENTIONS: 97164- PT Re-evaluation, 97110-Therapeutic exercises, 97530- Therapeutic activity, 97112- Neuromuscular re-education, 97535- Self Care, 16109- Manual therapy, [phone]- Gait training, [phone]- Orthotic Fit/training, [phone]- Aquatic Therapy, [phone]- Electrical stimulation (unattended), [phone]- Electrical stimulation (manual), Patient/Family education, Balance training, Taping, Dry Needling, Joint mobilization, Joint manipulation, Spinal manipulation, Spinal mobilization, Cryotherapy, and Moist heat  PLAN FOR NEXT SESSION:  Palpate upper traps and periscapular musculature for trigger points.  Continue to progress periscapular strength with lower trap setting on OMEGA machine and face pulls. Progress shoulder flexion and abduction eccentric exercises.   Ellin Goodie PT, DPT  [facility] [address], [place], [address] Phone: [phone]   Fax:  [phone]

## 2023-12-13 ENCOUNTER — Ambulatory Visit: Payer: Medicare Other | Admitting: Physical Therapy

## 2023-12-13 DIAGNOSIS — M542 Cervicalgia: Secondary | ICD-10-CM

## 2023-12-13 DIAGNOSIS — M25511 Pain in right shoulder: Secondary | ICD-10-CM | POA: Diagnosis not present

## 2023-12-13 DIAGNOSIS — M25512 Pain in left shoulder: Secondary | ICD-10-CM

## 2023-12-13 DIAGNOSIS — G8929 Other chronic pain: Secondary | ICD-10-CM | POA: Diagnosis not present

## 2023-12-13 NOTE — Therapy (Signed)
 OUTPATIENT PHYSICAL THERAPY SHOULDER DISCHARGE     Patient Name: Mary Wolfe MRN: [id number] DOB:06/16/1951, 73 y.o., female Today's Date: 12/13/2023  END OF SESSION:  PT End of Session - 12/13/23 0953     Visit Number 9    Number of Visits 15    Date for PT Re-Evaluation 01/03/24    Authorization Type Medicare 2025    Authorization - Visit Number 9    Authorization - Number of Visits 15    Progress Note Due on Visit 10    PT Start Time 0950    PT Stop Time 1030    PT Time Calculation (min) 40 min    Activity Tolerance Patient tolerated treatment well    Behavior During Therapy WFL for tasks assessed/performed                Past Medical History:  Diagnosis Date   Bursitis    Diverticulitis    Hyperlipidemia    Hypertension    Iron deficiency anemia    OA (osteoarthritis)    Obesity    PONV (postoperative nausea and vomiting)    Past Surgical History:  Procedure Laterality Date   CARPAL TUNNEL RELEASE Right    CESAREAN SECTION     COLONOSCOPY  03/14/2008   COLONOSCOPY WITH PROPOFOL N/A 06/25/2018   Procedure: COLONOSCOPY WITH PROPOFOL;  Surgeon: Christena Deem, MD;  Location: [facility] ENDOSCOPY;  Service: Endoscopy;  Laterality: N/A;   ESOPHAGOGASTRODUODENOSCOPY  04/01/1997   ESOPHAGOGASTRODUODENOSCOPY (EGD) WITH PROPOFOL N/A 06/25/2018   Procedure: ESOPHAGOGASTRODUODENOSCOPY (EGD) WITH PROPOFOL;  Surgeon: Christena Deem, MD;  Location: [facility] ENDOSCOPY;  Service: Endoscopy;  Laterality: N/A;   removed glass from elbow     from accident   TONSILLECTOMY     TOTAL HIP ARTHROPLASTY Right 04/16/2019   Procedure: Right Anterior Total Hip Arthroplasty;  Surgeon: Marcene Corning, MD;  Location: [facility] ORS;  Service: Orthopedics;  Laterality: Right;   Patient Active Problem List   Diagnosis Date Noted   URI with cough and congestion 10/06/2023   Trapezius strain 09/18/2023   Thumb injury 11/30/2022   Fall 11/30/2022   Prediabetes 06/26/2019   Greater trochanteric  bursitis of right hip 01/21/2019   Primary osteoarthritis of right hip 04/01/2016   Obesity 04/01/2016   Essential hypertension 01/31/2016   Hyperlipidemia 01/31/2016    PCP: Dr. Marikay Alar   REFERRING PROVIDER: Dr. Freddie Apley   REFERRING DIAG: [phone] (ICD-10-CM) - Left shoulder pain, unspecified chronicity   THERAPY DIAG:  No diagnosis found.  Rationale for Evaluation and Treatment: Rehabilitation  ONSET DATE: 10/07/23   SUBJECTIVE:  SUBJECTIVE STATEMENT:  Pt states that she continues to feel little to no shoulder pain and the only challenge that she had was with the #5 DB Hand dominance: Left  PERTINENT HISTORY: Pt states that she started to develop left shoulder pain after coming to physical therapy for right trap strain. She did experience a fall in 2021 where she fell on her left side. She did experience a lot of pain on her left side initially but the pain subsided until recently. She is able to sleep without pain or discomfort but continues to experience pain when reaching overhead or for objects out in front of her.   PAIN:  Are you having pain? Yes: NPRS scale: 1.0/10  Pain location: Anterior surface of left shoulder  Pain description: Achy  Aggravating factors: Reaching overhead.  Relieving factors: Shoulder Stretches   PRECAUTIONS: None  RED FLAGS: None   WEIGHT BEARING RESTRICTIONS: No  FALLS:  Has patient fallen in last 6 months? Yes. 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   NEXT MD VISIT: None listed   OBJECTIVE:  Note: Objective measures were completed at Evaluation unless otherwise noted.  VITALS: BP 134/73 HR 83 SpO2 98%  DIAGNOSTIC FINDINGS:  CLINICAL DATA:  Chronic shoulder pain after a fall 2 years ago.   EXAM: LEFT SHOULDER - 2+ VIEW   COMPARISON:  None.   FINDINGS: There is no evidence of fracture or dislocation. There are moderate acromioclavicular and glenohumeral degenerative changes. High-riding humeral head raising the possibility of rotator cuff pathology. Regional soft tissues are unremarkable.   IMPRESSION: 1. Moderate degenerative changes in the left shoulder. 2. High-riding humeral head raising the possibility of rotator cuff pathology.     Electronically Signed   By: Emmaline Kluver M.D.   On: 04/08/2020 14:17  PATIENT SURVEYS:  Quick Dash 14% symptom score and  0% Work Disability   COGNITION: Overall cognitive status: Within functional limits for tasks assessed     SENSATION: WFL  POSTURE: Forward head and rounded shoulders   UPPER EXTREMITY ROM:   Active ROM Right eval Left eval  Shoulder flexion 160 160   Shoulder extension    Shoulder abduction 160 160   Shoulder adduction    Shoulder internal rotation    Shoulder external rotation    Elbow flexion    Elbow extension    Wrist flexion    Wrist extension    Wrist ulnar deviation    Wrist radial deviation    Wrist pronation    Wrist supination    (Blank rows = not tested)  UPPER EXTREMITY MMT:  MMT Right eval Left eval Right 12/11/23 Left 12/11/23   Shoulder flexion 4+ 4+    Shoulder extension      Shoulder abduction 4+ 4+    Shoulder adduction      Shoulder internal rotation (90 abd) 4+ 4+ 4+ 4+  Shoulder external rotation (90 abd) 4 4*  4  Middle trapezius 4 4 4+ 4+  Lower trapezius 4- 4- 4 4-  Elbow flexion 4+ 4+    Elbow extension      Wrist flexion  Wrist  extension      Wrist ulnar deviation      Wrist radial deviation      Wrist pronation      Wrist supination      Grip strength (lbs)      (Blank rows = not tested)  SHOULDER SPECIAL TESTS: Impingement tests: Neer impingement test: positive  and Painful arc test: positive  Rotator cuff assessment: Drop arm test: negative, Empty can test: negative, Full can test: negative, External rotation lag sign: negative, Belly press test: negative, and Infraspinatus test: negative Biceps assessment: Speed's test: positive    PALPATION:  Not performed                                                                                                                              TREATMENT DATE:   12/13/23: THEREX   UBE seat at 10 with resistance at 3- 2.5 forward and 2.5 backward   OMEGA Lower Trap Set #50 3 x 10  OMEGA Face Pulls #5 1 x 10  OMEGA Face Pulls #10 2 x 10  Face Pulls with green band 1 x 10  Upper Trap Stretch 3 x 60 sec   Seated Shoulder Horizontal Abduction at 90 degrees with green band 1 x 10  Seated Shoulder Horizontal Abduction at 90 degrees with blue band 1 x 10   Shoulder AROM Flexion on LUE 160 degrees  Shoulder AROM abduction on LUE 160 degrees   Reviewed home exercise plan along with progressions.   12/11/23: THEREX   UBE seat at 10 with resistance at 4- 2.5 forward and 2.5 backward  OMEGA Lower Trap Set #45 3 x 10  Supine Dumbbell Pull Over #5 in RUE and LUE 3 x 10  Standing Horizontal Abduction with red band 3 x 10  -min VC to maintain extended elbows  Shoulder ER at 90 deg ABd R/L 4/4, Should IR at 90 deg Abd R/L 4/4 Shoulder ER and IR at 0 deg abduction R/L  4+/4+   12/06/23: THEREX   UBE seat at 10 with resistance at 4- 2.5 forward and 2.5 backward  OMEGA Lower Trap Set #35 1 x 10  OMEGA Lower Trap Set #45 2 x 10  Bent Over Y's  #3 DB 3 x 10  Bent Over T's #3 DB 1 x 10  Bent Over T's #5 DB 1 x 10  Serratus Wall Slides 2 x 8  -min VC to stop at point when she  starts to experience left anterior shoulder pain, around 120 degrees flexion on LUE     12/04/23: PHYSICAL PERFORMANCE  Mid Trap R/L 4+/4+ Lower Trap R/L 4-/4  THEREX  OMEGA Lower Trap Set #25 2 x 10  OMEGA Lower Trap Set #35 1 x 10  OMEGA Face Pull #5 2 x 10  Face Pulls with red band 1 x 10  Bent Over Y's with #2 DB 1 x 10  Eccentric Shoulder Flexion  on LUE #2 with mirror for Visual input 2 x 10   11/29/23: All exercises performed on LUE  THEREX   UBE with seat at 12 at 3 resistance- 2.5 min forward and 2.5 min backward   Eccentric Shoulder Flexion #2 DB 2 x 10  -Uses wall for support when sliding down the wall  -min VC to increase scapular retraction  Standing Single Arm Rows #10 1 x 10, #15 2 x 10  Standing Single Arm Lat Pull Downs #10 1 x 10, #5 2 x 10  -Pt struggles to bring arm through full ROM   PHYSICAL PERFORMANCE Shoulder ER Combined: Bilateral Occiput  Shoulder IR Combined: L4-L5    11/27/23: THEREX   UBE with seat at 12 at 3 resistance- 2.5 min forward and 2.5 min backward   Eccentric Left Shoulder Flexion 1 x 10  -Pt reports NRPS 3/10 at 70 deg  Supine Left Shoulder Flex with straight elbow 1 x 10  -pain at 60 deg  Supine Left Shoulder Flex with bent elbow   -pain at 60 deg   Seated Left Shoulder Flex with straight elbow 1 x 10  -mod VC to increased scapular retraction.  Seated Left Shoulder Flex with #1 DB 2 x 10 -VC with use of mirror to increase scapular retraction  Standing Rows with #35 1 x 10   Bent over Scapular Retraction with blue band on LUE 1 x 10   Bent over Scapular Retraction with black band on LUE 1 x 10  OMEGA Seated Rows #35 1 x 10  OMEGA Seated Rows #45 1 x 10 OMEGA Seated Rows #50 1 x 10  Standing Horizontal Abduction with red  band 1 x 10  Standing Horizontal Abduction with green band 1 x 10    PATIENT EDUCATION: Education details: Form and technique for correct performance of exercise and explanation about underlying pathology of  left shoulder  Person educated: Patient Education method: Explanation, Demonstration, Verbal cues, and Handouts Education comprehension: verbalized understanding, returned demonstration, and verbal cues required  HOME EXERCISE PROGRAM: Access Code: [email or web address] URL: [email or web address] Date: 12/13/2023 Prepared by: Ellin Goodie  Exercises - Seated Upper Trapezius Stretch  - 1 x daily - 3 reps - 60 sec hold - Corner Pec Major Stretch  - 1 x daily - 3 reps - 30-60 sec  hold - Shoulder Bent over Y's   - 3-4 x weekly - 3 sets - 10 reps - Standing Shoulder Flexion to 180 degrees with dumbbells   - 3-4 x weekly - 2 sets - 10 reps - Face Pulls  - 3-4 x weekly - 3 sets - 10 reps - Standing Shoulder Horizontal Abduction with Resistance  - 3-4 x weekly - 3 sets - 10 reps - Supine Shoulder Flexion Extension Full Range AROM  - 3-4 x weekly - 3 sets - 10 reps   ASSESSMENT:  CLINICAL IMPRESSION: Pt has continued to show progress towards goals for the past several weeks with ongoing decrease in left shoulder pain and improved in left shoulder and periscapular strength. PT did not reassess remaining goals, because pt was not at 10th visit. Unfortunately, pt does not have any remaining apts scheduled, but she believes that she is ready to discharge and PT is in agreement with this based on progress. Pt educated on home exercise plan that she should continue to maintain left shoulder strength and functional gains.   OBJECTIVE IMPAIRMENTS: decreased ROM and pain.   ACTIVITY LIMITATIONS: carrying, lifting, and reach  over head  PARTICIPATION LIMITATIONS: cleaning, laundry, shopping, and community activity  PERSONAL FACTORS: Age and 3+ comorbidities: HTN, HLD, Obesity and prediabetes   are also affecting patient's functional outcome.   REHAB POTENTIAL: Good  CLINICAL DECISION MAKING: Stable/uncomplicated  EVALUATION COMPLEXITY: Low   GOALS: Goals reviewed with patient? No  SHORT  TERM GOALS: Target date: 11/29/2023  Patient will demonstrate undestanding of home exercise plan by performing exercises correctly with evidence of good carry over with min to no verbal or tactile cues .   Baseline: NT 11/20/23: Performing independently  Goal status: ACHIEVED   LONG TERM GOALS: Target date: 01/24/2024  Patient will improve left shoulder function as evidenced by an improvement in QuickDash score of >=10 pts to return to using LUE more reliably for self grooming and house maintenance tasks.  Baseline: 14% disability 11/29/23 18% disability  Goal status: NOT MET   2.  Patient will improve left shoulder and periscapular strength by 1/3 grade MMT (4- to 4) for improved left shoulder function and to decrease left shoulder pain to complete typing and lifting tasks for work and home care tasks.  Baseline: Shoulder ER L 4, Middle Trap R/L 4/4, Lower R/L 4-/4- 12/04/23: Middle Trap R/L 4+/4+, Lower R/L 4/4-  12/11/23: Shoulder ER R/L 4/4, Should IR R/L 4/4 Goal status: NOT MET    3.  Patient will be able to perform combined left shoulder ER (touching occiput) and shoulder IR (L4-L5) without an increase in left shoulder pain >5/10 NRPS for improved grooming tasks and dressing herself without pain or discomfort.   Baseline: NT 11/29/23: Combined Left Shoulder IR and ER no NRPS  Goal status: ACHIEVED    PLAN:  PT FREQUENCY: 1-2x/week  PT DURATION: 10 weeks  PLANNED INTERVENTIONS: 97164- PT Re-evaluation, 97110-Therapeutic exercises, 97530- Therapeutic activity, 97112- Neuromuscular re-education, 97535- Self Care, 16109- Manual therapy, [phone]- Gait training, [phone]- Orthotic Fit/training, [phone]- Aquatic Therapy, [phone]- Electrical stimulation (unattended), [phone]- Electrical stimulation (manual), Patient/Family education, Balance training, Taping, Dry Needling, Joint mobilization, Joint manipulation, Spinal manipulation, Spinal mobilization, Cryotherapy, and Moist heat  PLAN FOR NEXT SESSION:   Discharge from PT.   Ellin Goodie PT, DPT  [facility] [address], [place], [address] Phone: [phone]   Fax:  [phone]

## 2023-12-29 DIAGNOSIS — H2513 Age-related nuclear cataract, bilateral: Secondary | ICD-10-CM | POA: Diagnosis not present

## 2023-12-29 DIAGNOSIS — H40013 Open angle with borderline findings, low risk, bilateral: Secondary | ICD-10-CM | POA: Diagnosis not present

## 2023-12-29 DIAGNOSIS — H04123 Dry eye syndrome of bilateral lacrimal glands: Secondary | ICD-10-CM | POA: Diagnosis not present

## 2023-12-29 DIAGNOSIS — H35371 Puckering of macula, right eye: Secondary | ICD-10-CM | POA: Diagnosis not present

## 2023-12-29 DIAGNOSIS — H35033 Hypertensive retinopathy, bilateral: Secondary | ICD-10-CM | POA: Diagnosis not present

## 2024-01-03 ENCOUNTER — Ambulatory Visit: Payer: Medicare Other | Admitting: Family Medicine

## 2024-01-10 ENCOUNTER — Other Ambulatory Visit: Payer: Self-pay

## 2024-01-10 ENCOUNTER — Telehealth: Payer: Self-pay

## 2024-01-10 MED ORDER — AMLODIPINE BESYLATE 10 MG PO TABS: ORAL_TABLET | ORAL | 0 refills | Status: DC

## 2024-01-10 NOTE — Telephone Encounter (Signed)
 Pt contacted to clarify her medication and states she only have 24 days left of her medication. Pt states that she had a appt scheduled with Dr. Sueanne Emerald and received notification that her appt needed to be rescheduled with different provider as Dr. Sueanne Emerald was leaving, so her new TOC appt is scheduled for 7/11 but she will be out of her medications before then. Pt instructed that Mobile bus is an option if one of the other providers in the office is not able to fill her medication.   Copied from CRM [id number]. Topic: Clinical - Medication Question >> Jan 10, 2024  8:40 AM Ary Bitter R wrote: Reason for CRM: Pt called in concerned about her amLODipine  (NORVASC ) 10 MG tablet. Her pcp left and she has an appt 7/11, but she will run out of this medication way before then. She is wondering if she will be able to get this medication filled before then by any doctor? There are no sooner appts and she is on a wait list. Please contact patient to confirm.

## 2024-01-10 NOTE — Telephone Encounter (Signed)
 Patient had a 60-day supply sent to pt's pharmacy. Patient notified and made aware.

## 2024-03-15 ENCOUNTER — Ambulatory Visit (INDEPENDENT_AMBULATORY_CARE_PROVIDER_SITE_OTHER): Admitting: Nurse Practitioner

## 2024-03-15 ENCOUNTER — Encounter: Payer: Self-pay | Admitting: Nurse Practitioner

## 2024-03-15 VITALS — BP 128/72 | HR 89 | Resp 16 | Ht 62.0 in | Wt 217.2 lb

## 2024-03-15 DIAGNOSIS — E559 Vitamin D deficiency, unspecified: Secondary | ICD-10-CM | POA: Diagnosis not present

## 2024-03-15 DIAGNOSIS — I10 Essential (primary) hypertension: Secondary | ICD-10-CM

## 2024-03-15 DIAGNOSIS — E785 Hyperlipidemia, unspecified: Secondary | ICD-10-CM | POA: Diagnosis not present

## 2024-03-15 DIAGNOSIS — E119 Type 2 diabetes mellitus without complications: Secondary | ICD-10-CM | POA: Diagnosis not present

## 2024-03-15 DIAGNOSIS — R7303 Prediabetes: Secondary | ICD-10-CM | POA: Diagnosis not present

## 2024-03-15 DIAGNOSIS — Z1231 Encounter for screening mammogram for malignant neoplasm of breast: Secondary | ICD-10-CM

## 2024-03-15 NOTE — Patient Instructions (Signed)

## 2024-03-15 NOTE — Assessment & Plan Note (Signed)
 Chronic issue.  -Will check lipid panel.

## 2024-03-15 NOTE — Assessment & Plan Note (Signed)
 A1c increased from 5.8% to 6.7%, indicating progression to diabetes. No symptoms of polyuria, polydipsia, or polyphagia. - Order A1c test to reassess current level. - Discuss potential initiation of medication if A1c remains elevated.

## 2024-03-15 NOTE — Assessment & Plan Note (Signed)
 Chronic. Stable. Continue amlodipine  10 mg daily

## 2024-03-15 NOTE — Progress Notes (Signed)
 Established Patient Office Visit  Subjective:  Patient ID: Mary Wolfe, female    DOB: [birth year]  Age: 73 y.o. MRN: [id number]  CC:  Chief Complaint  Patient presents with   Transitions Of Care  Discussed the use of a AI scribe software for clinical note transcription with the patient, who gave verbal consent to proceed.   HPI  Mary Wolfe presents for transition of care. Her previous PCP was Dr. Maribeth.   Her hemoglobin A1c has increased from 5.8% to 6.7% over the past few months. She manages her diet. She has no symptoms of polyuria, polydipsia, or polyphagia.  She takes amlodipine  for blood pressure, with a current reading of 128/72 mmHg. She also takes B12 sublingual and vitamin D3 at 25 mcg daily, though she occasionally forgets to take it. She does not take calcium  supplements.  She lives with her husband, works part-time as a Diplomatic Services operational officer at [employer], and is incorporating regular exercise into her routine with the use of a treadmill.  HPI   Past Medical History:  Diagnosis Date   Bursitis    Diverticulitis    Hyperlipidemia    Hypertension    Iron deficiency anemia    OA (osteoarthritis)    Obesity    PONV (postoperative nausea and vomiting)     Past Surgical History:  Procedure Laterality Date   CARPAL TUNNEL RELEASE Right    CESAREAN SECTION     COLONOSCOPY  03/14/2008   COLONOSCOPY WITH PROPOFOL  N/A 06/25/2018   Procedure: COLONOSCOPY WITH PROPOFOL ;  Surgeon: Gaylyn Gladis PENNER, MD;  Location: [facility] ENDOSCOPY;  Service: Endoscopy;  Laterality: N/A;   ESOPHAGOGASTRODUODENOSCOPY  04/01/1997   ESOPHAGOGASTRODUODENOSCOPY (EGD) WITH PROPOFOL  N/A 06/25/2018   Procedure: ESOPHAGOGASTRODUODENOSCOPY (EGD) WITH PROPOFOL ;  Surgeon: Gaylyn Gladis PENNER, MD;  Location: [facility] ENDOSCOPY;  Service: Endoscopy;  Laterality: N/A;   removed glass from elbow     from accident   TONSILLECTOMY     TOTAL HIP ARTHROPLASTY Right 04/16/2019   Procedure: Right Anterior Total Hip  Arthroplasty;  Surgeon: Sheril Coy, MD;  Location: [facility] ORS;  Service: Orthopedics;  Laterality: Right;    Family History  Problem Relation Age of Onset   Heart disease Father    Breast cancer Maternal Aunt 49   Heart disease Other    Arthritis Other     Social History   Socioeconomic History   Marital status: Married    Spouse name: Not on file   Number of children: Not on file   Years of education: Not on file   Highest education level: Associate degree: occupational, Scientist, product/process development, or vocational program  Occupational History   Not on file  Tobacco Use   Smoking status: Never   Smokeless tobacco: Never  Vaping Use   Vaping status: Never Used  Substance and Sexual Activity   Alcohol use: No    Alcohol/week: 0.0 standard drinks of alcohol   Drug use: No   Sexual activity: Yes    Birth control/protection: None  Other Topics Concern   Not on file  Social History Narrative   Married   Social Drivers of Health   Financial Resource Strain: Low Risk  (11/15/2023)   Overall Financial Resource Strain (CARDIA)    Difficulty of Paying Living Expenses: Not hard at all  Food Insecurity: No Food Insecurity (11/15/2023)   Hunger Vital Sign    Worried About Running Out of Food in the Last Year: Never true    Ran Out of Food in  the Last Year: Never true  Transportation Needs: No Transportation Needs (11/15/2023)   PRAPARE - Administrator, Civil Service (Medical): No    Lack of Transportation (Non-Medical): No  Physical Activity: Inactive (11/15/2023)   Exercise Vital Sign    Days of Exercise per Week: 0 days    Minutes of Exercise per Session: 0 min  Stress: No Stress Concern Present (11/15/2023)   [employer] of Occupational Health - Occupational Stress Questionnaire    Feeling of Stress : Not at all  Social Connections: Socially Integrated (11/15/2023)   Social Connection and Isolation Panel    Frequency of Communication with Friends and Family: More than three  times a week    Frequency of Social Gatherings with Friends and Family: More than three times a week    Attends Religious Services: More than 4 times per year    Active Member of [employer] or Organizations: Yes    Attends Banker Meetings: More than 4 times per year    Marital Status: Married  Catering manager Violence: Not At Risk (11/15/2023)   Humiliation, Afraid, Rape, and Kick questionnaire    Fear of Current or Ex-Partner: No    Emotionally Abused: No    Physically Abused: No    Sexually Abused: No     Outpatient Medications Prior to Visit  Medication Sig Dispense Refill   amLODipine  (NORVASC ) 10 MG tablet TAKE 1 TABLET(10 MG) BY MOUTH DAILY 60 tablet 0   cholecalciferol (VITAMIN D3) 25 MCG (1000 UT) tablet Take 1,000 Units by mouth daily.     Cyanocobalamin 1000 MCG SUBL Place 1,000 mcg under the tongue daily.     No facility-administered medications prior to visit.    Allergies  Allergen Reactions   Nickel Rash    ROS Review of Systems Negative unless indicated in HPI.    Objective:    Physical Exam Constitutional:      Appearance: Normal appearance.  HENT:     Head: Normocephalic.     Right Ear: Tympanic membrane normal.     Left Ear: Tympanic membrane normal.     Mouth/Throat:     Mouth: Mucous membranes are moist.  Eyes:     Conjunctiva/sclera: Conjunctivae normal.     Pupils: Pupils are equal, round, and reactive to light.  Cardiovascular:     Rate and Rhythm: Normal rate and regular rhythm.     Pulses: Normal pulses.     Heart sounds: Normal heart sounds.  Pulmonary:     Effort: Pulmonary effort is normal.     Breath sounds: Normal breath sounds.  Abdominal:     General: Bowel sounds are normal.     Palpations: Abdomen is soft. There is no mass.     Tenderness: There is no guarding.  Musculoskeletal:     Cervical back: Normal range of motion. No tenderness.  Skin:    General: Skin is warm.     Findings: No bruising.  Neurological:      General: No focal deficit present.     Mental Status: She is alert and oriented to person, place, and time. Mental status is at baseline.  Psychiatric:        Mood and Affect: Mood normal.        Behavior: Behavior normal.        Thought Content: Thought content normal.        Judgment: Judgment normal.     BP 128/72   Pulse 89  Resp 16   Ht 5' 2 (1.575 m)   Wt 217 lb 3.2 oz (98.5 kg)   SpO2 99%   BMI 39.73 kg/m  Wt Readings from Last 3 Encounters:  03/15/24 217 lb 3.2 oz (98.5 kg)  11/15/23 218 lb (98.9 kg)  10/06/23 214 lb 6.4 oz (97.3 kg)     Health Maintenance  Topic Date Due   FOOT EXAM  Never done   OPHTHALMOLOGY EXAM  Never done   Diabetic kidney evaluation - Urine ACR  Never done   MAMMOGRAM  04/15/2022   COVID-19 Vaccine (3 - 2024-25 season) 03/30/2024 (Originally 05/07/2023)   HEMOGLOBIN A1C  03/17/2024   INFLUENZA VACCINE  04/05/2024   Diabetic kidney evaluation - eGFR measurement  09/17/2024   Medicare Annual Wellness (AWV)  11/14/2024   DTaP/Tdap/Td (2 - Td or Tdap) 06/24/2026   Colonoscopy  06/25/2028   DEXA SCAN  Completed   Hepatitis C Screening  Completed   Hepatitis B Vaccines  Aged Out   HPV VACCINES  Aged Out   Meningococcal B Vaccine  Aged Out   Pneumococcal Vaccine: 50+ Years  Discontinued   Zoster Vaccines- Shingrix  Discontinued    There are no preventive care reminders to display for this patient.  Lab Results  Component Value Date   TSH 1.18 10/18/2017   Lab Results  Component Value Date   WBC 8.4 04/11/2019   HGB 12.8 04/11/2019   HCT 39.9 04/11/2019   MCV 91.9 04/11/2019   PLT 217 04/11/2019   Lab Results  Component Value Date   NA 141 09/18/2023   K 4.3 09/18/2023   CO2 28 09/18/2023   GLUCOSE 114 (H) 09/18/2023   BUN 9 09/18/2023   CREATININE 0.79 09/18/2023   BILITOT 0.5 11/30/2022   ALKPHOS 90 11/30/2022   AST 18 11/30/2022   ALT 15 11/30/2022   PROT 7.7 11/30/2022   ALBUMIN 4.7 11/30/2022   CALCIUM  10.0  09/18/2023   ANIONGAP 11 04/11/2019   GFR 75.60 09/18/2023   Lab Results  Component Value Date   CHOL 281 (H) 11/30/2022   Lab Results  Component Value Date   HDL 53.70 11/30/2022   Lab Results  Component Value Date   LDLCALC 211 (H) 11/30/2022   Lab Results  Component Value Date   TRIG 81.0 11/30/2022   Lab Results  Component Value Date   CHOLHDL 5 11/30/2022   Lab Results  Component Value Date   HGBA1C 6.7 (H) 09/18/2023      Assessment & Plan:  Essential hypertension Assessment & Plan: Chronic Stable. -Continue amlodipine  10 mg daily.  Orders: -     Comprehensive metabolic panel with GFR  Visit for screening mammogram -     3D Screening Mammogram, Left and Right; Future  Hyperlipidemia, unspecified hyperlipidemia type Assessment & Plan: Chronic issue.  -Will check lipid panel.   Orders: -     Lipid panel  Vitamin D  deficiency -     VITAMIN D  25 Hydroxy (Vit-D Deficiency, Fractures)  Diabetes mellitus type 2, diet-controlled (HCC) Assessment & Plan: A1c increased from 5.8% to 6.7%, indicating progression to diabetes. No symptoms of polyuria, polydipsia, or polyphagia. - Order A1c test to reassess current level. - Discuss potential initiation of medication if A1c remains elevated.  Orders: -     Hemoglobin A1c    Follow-up: Return in about 6 months (around 09/15/2024), or if symptoms worsen or fail to improve, for chronic management.   Yaquelin Langelier, NP

## 2024-03-16 LAB — COMPREHENSIVE METABOLIC PANEL WITH GFR
AG Ratio: 1.5 (calc) (ref 1.0–2.5)
ALT: 13 U/L (ref 6–29)
AST: 17 U/L (ref 10–35)
Albumin: 4.7 g/dL (ref 3.6–5.1)
Alkaline phosphatase (APISO): 81 U/L (ref 37–153)
BUN: 15 mg/dL (ref 7–25)
CO2: 27 mmol/L (ref 20–32)
Calcium: 10.1 mg/dL (ref 8.6–10.4)
Chloride: 105 mmol/L (ref 98–110)
Creat: 0.78 mg/dL (ref 0.60–1.00)
Globulin: 3.1 g/dL (ref 1.9–3.7)
Glucose, Bld: 90 mg/dL (ref 65–99)
Potassium: 4.1 mmol/L (ref 3.5–5.3)
Sodium: 142 mmol/L (ref 135–146)
Total Bilirubin: 0.7 mg/dL (ref 0.2–1.2)
Total Protein: 7.8 g/dL (ref 6.1–8.1)
eGFR: 81 mL/min/1.73m2 (ref >=60)

## 2024-03-16 LAB — LIPID PANEL
Cholesterol: 310 mg/dL — ABNORMAL HIGH (ref <200)
HDL: 60 mg/dL (ref >=50)
LDL Cholesterol (Calc): 227 mg/dL — ABNORMAL HIGH
Non-HDL Cholesterol (Calc): 250 mg/dL — ABNORMAL HIGH (ref <130)
Total CHOL/HDL Ratio: 5.2 (calc) — ABNORMAL HIGH (ref <5.0)
Triglycerides: 97 mg/dL (ref <150)

## 2024-03-16 LAB — HEMOGLOBIN A1C
Hgb A1c MFr Bld: 6.6 % — ABNORMAL HIGH (ref <5.7)
Mean Plasma Glucose: 143 mg/dL
eAG (mmol/L): 7.9 mmol/L

## 2024-03-16 LAB — VITAMIN D 25 HYDROXY (VIT D DEFICIENCY, FRACTURES): Vit D, 25-Hydroxy: 31 ng/mL (ref 30–100)

## 2024-03-21 ENCOUNTER — Encounter: Payer: Self-pay | Admitting: Nurse Practitioner

## 2024-03-22 ENCOUNTER — Other Ambulatory Visit: Payer: Self-pay

## 2024-03-22 MED ORDER — AMLODIPINE BESYLATE 10 MG PO TABS: ORAL_TABLET | ORAL | 5 refills | Status: AC

## 2024-04-05 ENCOUNTER — Ambulatory Visit
Admission: RE | Admit: 2024-04-05 | Discharge: 2024-04-05 | Disposition: A | Discharge status: Home / Self Care | Source: Ambulatory Visit | Attending: Nurse Practitioner | Admitting: Nurse Practitioner

## 2024-04-05 DIAGNOSIS — Z1231 Encounter for screening mammogram for malignant neoplasm of breast: Secondary | ICD-10-CM | POA: Insufficient documentation

## 2024-04-16 ENCOUNTER — Telehealth: Payer: Self-pay | Admitting: Nurse Practitioner

## 2024-04-16 DIAGNOSIS — E785 Hyperlipidemia, unspecified: Secondary | ICD-10-CM

## 2024-04-16 NOTE — Telephone Encounter (Signed)
 Fasting lab in a week.

## 2024-04-17 NOTE — Telephone Encounter (Signed)
 Patient states what lab and then preceded with Dr. Maribeth has already checked her cholesterol which is High and put her on cholesterol medications which made her joints hurt and she is not going down that road again. Patient states she does not need cholesterol labs cause she knows they are high and she is not going back on those medications that cause her to hurt like that. Patient states you can relay that message to the provider.

## 2024-06-24 DIAGNOSIS — H40013 Open angle with borderline findings, low risk, bilateral: Secondary | ICD-10-CM | POA: Diagnosis not present

## 2024-06-24 DIAGNOSIS — H35371 Puckering of macula, right eye: Secondary | ICD-10-CM | POA: Diagnosis not present

## 2024-06-24 DIAGNOSIS — H2513 Age-related nuclear cataract, bilateral: Secondary | ICD-10-CM | POA: Diagnosis not present

## 2024-06-24 DIAGNOSIS — H5203 Hypermetropia, bilateral: Secondary | ICD-10-CM | POA: Diagnosis not present

## 2024-06-24 DIAGNOSIS — H524 Presbyopia: Secondary | ICD-10-CM | POA: Diagnosis not present

## 2024-06-24 DIAGNOSIS — H52223 Regular astigmatism, bilateral: Secondary | ICD-10-CM | POA: Diagnosis not present

## 2024-06-24 DIAGNOSIS — H35033 Hypertensive retinopathy, bilateral: Secondary | ICD-10-CM | POA: Diagnosis not present

## 2024-06-24 DIAGNOSIS — H04123 Dry eye syndrome of bilateral lacrimal glands: Secondary | ICD-10-CM | POA: Diagnosis not present

## 2024-10-04 ENCOUNTER — Ambulatory Visit: Admitting: Nurse Practitioner

## 2024-11-01 ENCOUNTER — Ambulatory Visit: Admitting: Nurse Practitioner

## 2024-11-20 ENCOUNTER — Ambulatory Visit
